# Patient Record
Sex: Female | Born: 1955 | Race: White | Hispanic: No | Marital: Married | Smoking: Former smoker
Health system: Southern US, Community
[De-identification: ages and names within clinical notes are randomized; demographics above are authoritative.]

## PROBLEM LIST (undated history)

## (undated) DIAGNOSIS — E079 Disorder of thyroid, unspecified: Secondary | ICD-10-CM

## (undated) DIAGNOSIS — E781 Pure hyperglyceridemia: Secondary | ICD-10-CM

## (undated) DIAGNOSIS — I10 Essential (primary) hypertension: Secondary | ICD-10-CM

## (undated) DIAGNOSIS — I2510 Atherosclerotic heart disease of native coronary artery without angina pectoris: Secondary | ICD-10-CM

## (undated) DIAGNOSIS — N200 Calculus of kidney: Secondary | ICD-10-CM

## (undated) DIAGNOSIS — G4730 Sleep apnea, unspecified: Secondary | ICD-10-CM

## (undated) DIAGNOSIS — I509 Heart failure, unspecified: Secondary | ICD-10-CM

## (undated) DIAGNOSIS — M549 Dorsalgia, unspecified: Secondary | ICD-10-CM

## (undated) DIAGNOSIS — M1990 Unspecified osteoarthritis, unspecified site: Secondary | ICD-10-CM

## (undated) DIAGNOSIS — Z720 Tobacco use: Secondary | ICD-10-CM

## (undated) DIAGNOSIS — J449 Chronic obstructive pulmonary disease, unspecified: Secondary | ICD-10-CM

## (undated) DIAGNOSIS — M810 Age-related osteoporosis without current pathological fracture: Secondary | ICD-10-CM

## (undated) DIAGNOSIS — Z95810 Presence of automatic (implantable) cardiac defibrillator: Secondary | ICD-10-CM

## (undated) DIAGNOSIS — I429 Cardiomyopathy, unspecified: Secondary | ICD-10-CM

## (undated) HISTORY — PX: CARPAL TUNNEL RELEASE: SHX101

## (undated) HISTORY — PX: PARTIAL HYSTERECTOMY: SHX80

## (undated) HISTORY — PX: TONSILLECTOMY: SUR1361

## (undated) HISTORY — PX: UPPER GASTROINTESTINAL ENDOSCOPY: SHX188

## (undated) HISTORY — DX: Sleep apnea, unspecified: G47.30

## (undated) HISTORY — DX: Cardiomyopathy, unspecified: I42.9

## (undated) HISTORY — PX: BLADDER SURGERY: SHX569

## (undated) HISTORY — DX: Atherosclerotic heart disease of native coronary artery without angina pectoris: I25.10

---

## 1998-02-01 ENCOUNTER — Other Ambulatory Visit: Admission: RE | Admit: 1998-02-01 | Discharge: 1998-02-01 | Payer: Self-pay | Admitting: Obstetrics & Gynecology

## 1998-04-27 ENCOUNTER — Other Ambulatory Visit: Admission: RE | Admit: 1998-04-27 | Discharge: 1998-04-27 | Payer: Self-pay | Admitting: Obstetrics & Gynecology

## 1998-06-14 ENCOUNTER — Other Ambulatory Visit: Admission: RE | Admit: 1998-06-14 | Discharge: 1998-06-14 | Payer: Self-pay | Admitting: Obstetrics & Gynecology

## 1998-10-17 ENCOUNTER — Other Ambulatory Visit: Admission: RE | Admit: 1998-10-17 | Discharge: 1998-10-17 | Payer: Self-pay | Admitting: Obstetrics & Gynecology

## 1999-01-23 ENCOUNTER — Other Ambulatory Visit: Admission: RE | Admit: 1999-01-23 | Discharge: 1999-01-23 | Payer: Self-pay | Admitting: Obstetrics & Gynecology

## 1999-02-02 ENCOUNTER — Other Ambulatory Visit: Admission: RE | Admit: 1999-02-02 | Discharge: 1999-02-02 | Payer: Self-pay | Admitting: Obstetrics & Gynecology

## 1999-03-13 ENCOUNTER — Ambulatory Visit (HOSPITAL_COMMUNITY): Admission: RE | Admit: 1999-03-13 | Discharge: 1999-03-13 | Payer: Self-pay | Admitting: *Deleted

## 1999-07-13 ENCOUNTER — Other Ambulatory Visit: Admission: RE | Admit: 1999-07-13 | Discharge: 1999-07-13 | Payer: Self-pay | Admitting: *Deleted

## 1999-07-14 ENCOUNTER — Other Ambulatory Visit: Admission: RE | Admit: 1999-07-14 | Discharge: 1999-07-14 | Payer: Self-pay | Admitting: *Deleted

## 1999-08-21 ENCOUNTER — Inpatient Hospital Stay (HOSPITAL_COMMUNITY): Admission: RE | Admit: 1999-08-21 | Discharge: 1999-08-28 | Payer: Self-pay | Admitting: *Deleted

## 1999-08-21 ENCOUNTER — Encounter (INDEPENDENT_AMBULATORY_CARE_PROVIDER_SITE_OTHER): Payer: Self-pay

## 1999-08-25 ENCOUNTER — Encounter: Payer: Self-pay | Admitting: General Surgery

## 2000-08-29 ENCOUNTER — Encounter: Admission: RE | Admit: 2000-08-29 | Discharge: 2000-08-29 | Payer: Self-pay | Admitting: *Deleted

## 2000-10-18 ENCOUNTER — Other Ambulatory Visit: Admission: RE | Admit: 2000-10-18 | Discharge: 2000-10-18 | Payer: Self-pay | Admitting: *Deleted

## 2001-10-16 ENCOUNTER — Encounter: Payer: Self-pay | Admitting: Family Medicine

## 2001-10-16 ENCOUNTER — Encounter: Admission: RE | Admit: 2001-10-16 | Discharge: 2001-10-16 | Payer: Self-pay | Admitting: Family Medicine

## 2001-10-21 ENCOUNTER — Other Ambulatory Visit: Admission: RE | Admit: 2001-10-21 | Discharge: 2001-10-21 | Payer: Self-pay | Admitting: Obstetrics and Gynecology

## 2002-11-24 ENCOUNTER — Encounter: Payer: Self-pay | Admitting: Family Medicine

## 2002-11-24 ENCOUNTER — Encounter: Admission: RE | Admit: 2002-11-24 | Discharge: 2002-11-24 | Payer: Self-pay | Admitting: Family Medicine

## 2003-02-08 ENCOUNTER — Encounter (HOSPITAL_COMMUNITY): Admission: RE | Admit: 2003-02-08 | Discharge: 2003-05-09 | Payer: Self-pay | Admitting: Endocrinology

## 2003-02-09 ENCOUNTER — Encounter: Payer: Self-pay | Admitting: Endocrinology

## 2003-12-02 ENCOUNTER — Encounter: Admission: RE | Admit: 2003-12-02 | Discharge: 2003-12-02 | Payer: Self-pay | Admitting: Family Medicine

## 2004-07-13 ENCOUNTER — Encounter (HOSPITAL_COMMUNITY): Admission: RE | Admit: 2004-07-13 | Discharge: 2004-10-11 | Payer: Self-pay | Admitting: Endocrinology

## 2004-12-05 ENCOUNTER — Encounter: Admission: RE | Admit: 2004-12-05 | Discharge: 2004-12-05 | Payer: Self-pay | Admitting: Family Medicine

## 2005-12-25 ENCOUNTER — Encounter: Admission: RE | Admit: 2005-12-25 | Discharge: 2005-12-25 | Payer: Self-pay | Admitting: Family Medicine

## 2006-08-20 ENCOUNTER — Encounter: Admission: RE | Admit: 2006-08-20 | Discharge: 2006-08-20 | Payer: Self-pay | Admitting: Family Medicine

## 2006-09-12 ENCOUNTER — Encounter (HOSPITAL_COMMUNITY)
Admission: RE | Admit: 2006-09-12 | Discharge: 2006-10-12 | Payer: Self-pay | Admitting: Physical Medicine and Rehabilitation

## 2006-12-30 ENCOUNTER — Encounter: Admission: RE | Admit: 2006-12-30 | Discharge: 2006-12-30 | Payer: Self-pay | Admitting: Family Medicine

## 2008-08-05 ENCOUNTER — Encounter: Admission: RE | Admit: 2008-08-05 | Discharge: 2008-08-05 | Payer: Self-pay | Admitting: Family Medicine

## 2009-01-04 ENCOUNTER — Encounter (INDEPENDENT_AMBULATORY_CARE_PROVIDER_SITE_OTHER): Payer: Self-pay | Admitting: *Deleted

## 2009-01-21 ENCOUNTER — Ambulatory Visit: Payer: Self-pay | Admitting: Internal Medicine

## 2009-02-14 ENCOUNTER — Encounter: Payer: Self-pay | Admitting: Internal Medicine

## 2009-02-14 ENCOUNTER — Ambulatory Visit: Payer: Self-pay | Admitting: Internal Medicine

## 2009-02-15 ENCOUNTER — Encounter: Payer: Self-pay | Admitting: Internal Medicine

## 2009-08-12 ENCOUNTER — Encounter: Admission: RE | Admit: 2009-08-12 | Discharge: 2009-08-12 | Payer: Self-pay | Admitting: Family Medicine

## 2009-12-25 ENCOUNTER — Emergency Department (HOSPITAL_COMMUNITY): Admission: EM | Admit: 2009-12-25 | Discharge: 2009-12-25 | Payer: Self-pay | Admitting: Emergency Medicine

## 2010-03-27 ENCOUNTER — Other Ambulatory Visit: Admission: RE | Admit: 2010-03-27 | Discharge: 2010-03-27 | Payer: Self-pay | Admitting: Endocrinology

## 2010-08-25 ENCOUNTER — Encounter: Admission: RE | Admit: 2010-08-25 | Discharge: 2010-08-25 | Payer: Self-pay | Admitting: Endocrinology

## 2010-11-25 ENCOUNTER — Encounter: Payer: Self-pay | Admitting: Endocrinology

## 2010-11-26 ENCOUNTER — Encounter: Payer: Self-pay | Admitting: Endocrinology

## 2011-01-24 LAB — URINALYSIS, ROUTINE W REFLEX MICROSCOPIC
Bilirubin Urine: NEGATIVE
Glucose, UA: NEGATIVE mg/dL
Leukocytes, UA: NEGATIVE
Nitrite: NEGATIVE
Protein, ur: NEGATIVE mg/dL
Specific Gravity, Urine: 1.015 (ref 1.005–1.030)
Urobilinogen, UA: 0.2 mg/dL (ref 0.0–1.0)
pH: 7.5 (ref 5.0–8.0)

## 2011-01-24 LAB — URINE MICROSCOPIC-ADD ON

## 2011-01-24 LAB — COMPREHENSIVE METABOLIC PANEL
ALT: 82 U/L — ABNORMAL HIGH (ref 0–35)
AST: 66 U/L — ABNORMAL HIGH (ref 0–37)
Albumin: 3.6 g/dL (ref 3.5–5.2)
Alkaline Phosphatase: 87 U/L (ref 39–117)
BUN: 15 mg/dL (ref 6–23)
CO2: 26 mEq/L (ref 19–32)
Calcium: 9.7 mg/dL (ref 8.4–10.5)
Chloride: 107 mEq/L (ref 96–112)
Creatinine, Ser: 1.07 mg/dL (ref 0.4–1.2)
GFR calc Af Amer: >60 mL/min (ref >60)
GFR calc non Af Amer: 54 mL/min — ABNORMAL LOW (ref >60)
Glucose, Bld: 134 mg/dL — ABNORMAL HIGH (ref 70–99)
Potassium: 3.8 mEq/L (ref 3.5–5.1)
Sodium: 139 mEq/L (ref 135–145)
Total Bilirubin: 0.7 mg/dL (ref 0.3–1.2)
Total Protein: 7.1 g/dL (ref 6.0–8.3)

## 2011-01-24 LAB — CBC
HCT: 41.2 % (ref 36.0–46.0)
Hemoglobin: 13.8 g/dL (ref 12.0–15.0)
MCHC: 33.6 g/dL (ref 30.0–36.0)
MCV: 94.4 fL (ref 78.0–100.0)
Platelets: 185 10*3/uL (ref 150–400)
RBC: 4.36 MIL/uL (ref 3.87–5.11)
RDW: 13.9 % (ref 11.5–15.5)
WBC: 9.9 10*3/uL (ref 4.0–10.5)

## 2011-01-24 LAB — URINE CULTURE
Colony Count: NO GROWTH
Culture: NO GROWTH

## 2011-01-24 LAB — DIFFERENTIAL
Basophils Absolute: 0 10*3/uL (ref 0.0–0.1)
Basophils Relative: 0 % (ref 0–1)
Eosinophils Absolute: 0.1 10*3/uL (ref 0.0–0.7)
Eosinophils Relative: 1 % (ref 0–5)
Lymphocytes Relative: 12 % (ref 12–46)
Lymphs Abs: 1.2 10*3/uL (ref 0.7–4.0)
Monocytes Absolute: 0.8 10*3/uL (ref 0.1–1.0)
Monocytes Relative: 9 % (ref 3–12)
Neutro Abs: 7.7 10*3/uL (ref 1.7–7.7)
Neutrophils Relative %: 78 % — ABNORMAL HIGH (ref 43–77)

## 2011-07-24 ENCOUNTER — Other Ambulatory Visit: Payer: Self-pay | Admitting: Endocrinology

## 2011-07-24 DIAGNOSIS — Z1231 Encounter for screening mammogram for malignant neoplasm of breast: Secondary | ICD-10-CM

## 2011-08-06 ENCOUNTER — Ambulatory Visit (HOSPITAL_BASED_OUTPATIENT_CLINIC_OR_DEPARTMENT_OTHER)
Admission: RE | Admit: 2011-08-06 | Discharge: 2011-08-07 | Disposition: A | Discharge status: Home / Self Care | Payer: PRIVATE HEALTH INSURANCE | Source: Ambulatory Visit | Attending: Urology | Admitting: Urology

## 2011-08-06 DIAGNOSIS — F17200 Nicotine dependence, unspecified, uncomplicated: Secondary | ICD-10-CM | POA: Insufficient documentation

## 2011-08-06 DIAGNOSIS — N815 Vaginal enterocele: Secondary | ICD-10-CM | POA: Insufficient documentation

## 2011-08-06 DIAGNOSIS — I10 Essential (primary) hypertension: Secondary | ICD-10-CM | POA: Insufficient documentation

## 2011-08-06 DIAGNOSIS — N8189 Other female genital prolapse: Secondary | ICD-10-CM | POA: Insufficient documentation

## 2011-08-06 DIAGNOSIS — Z01810 Encounter for preprocedural cardiovascular examination: Secondary | ICD-10-CM | POA: Insufficient documentation

## 2011-08-06 DIAGNOSIS — N8111 Cystocele, midline: Secondary | ICD-10-CM | POA: Insufficient documentation

## 2011-08-06 DIAGNOSIS — Z01812 Encounter for preprocedural laboratory examination: Secondary | ICD-10-CM | POA: Insufficient documentation

## 2011-08-06 DIAGNOSIS — N393 Stress incontinence (female) (male): Secondary | ICD-10-CM | POA: Insufficient documentation

## 2011-08-06 LAB — POCT I-STAT 4, (NA,K, GLUC, HGB,HCT)
Glucose, Bld: 96 mg/dL (ref 70–99)
HCT: 43 % (ref 36.0–46.0)
Hemoglobin: 14.6 g/dL (ref 12.0–15.0)
Potassium: 3.8 mEq/L (ref 3.5–5.1)
Sodium: 142 mEq/L (ref 135–145)

## 2011-08-10 NOTE — Op Note (Signed)
Stacy Hernandez, Stacy Hernandez               ACCOUNT NO.:  [id number]  MEDICAL RECORD NO.:  [id number]  LOCATION:                               FACILITY:  [facility]  PHYSICIAN:  Emersen Carroll I. 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  OPERATIONS:  Anterior vaginal vault exploration, Kelly plication, enterocele repair with tissue implantation (Xenform), [employer] scientific uphold anterior vault suspension, lynx pubovaginal sling.  SURGEON:  Jaidon Ellery I. Patsi Sears, M.D.  ANESTHESIA:  General LMA.  PREPARATION:  After appropriate preanesthesia, the patient was brought to the operating room, placed on the operating table in the dorsal supine position.  She was replaced in dorsal lithotomy position where the pubis was prepped with Betadine solution and draped in usual fashion.  REVIEW OF HISTORY:  This 55 year old female, has a history of "bulge" at the introitus, and feels that she is sitting on "something hard."  She has urinary stress incontinence, is not sexually active.  She is status post hysterectomy 8 years ago, gravida 3, para 3, smoking 2 packs a day for 33 years (nicotine patch in OR).  Note, that the patient was noted to have a poorly estrogenized vagina, treated with estrogen, with a grade 3 anterior vault defect with no rectocele present.  She had video urodynamics, which showed hypermobile urethra, but she did not leak for stress incontinence evaluation.  She did have a small capacity, unstable bladder, with a large cystocele.  No enterocele was identified and no rectocele was identified.  PROCEDURE:  With the patient in the lithotomy position, vaginal examination reveals a  grade 3 cystocele, with obvious level I support gone.  Horizontal marking was made 1 cm beneath the Foley catheter balloon, marked with a blue dot, to indicate vaginal incision.  Midline urethra, outline of incision was made with blue marking pen.  Marcaine 0.25 with epinephrine 1:200,000, diluted with normal saline was injected in the vaginal incision site.  Horizontal incision was then made across the vagina, and subcutaneous tissue dissected with sharp and blunt dissection.  It was noted that the apex of the vagina was definitely off-center, way over on the left edge of the vagina.  Careful dissection revealed enterocele present at the level of the vaginal apex. I elected to dissect out the enterocele sac, closed the enterocele sac. I then elected to place a 2 cm x 2 cm portion of Xenform as a patch over the enterocele repair, sutured in place with 2-0 Vicryl suture in a smooth patch formation.  Prior to placing the patch, dissection was accomplished into the pelvic space, were identified bilaterally the ischial spines, and the sacrospinous ligaments.  Using the Capio device, the sacrospinous ligament sutures were placed bilaterally.  The patient underwent a small Kelly plication, and then underwent Xenform patch repair after the enterocele sac was closed.  It was noted it is unusual for an enterocele to present in the anterior vault, but it is felt associated  with the extreme left deviation of the cervical scar from hysterectomy.  Following this, the uphold was positioned and both the proximal and distal portions were sutured in position with 2-0 Vicryl suture.  The mesh was smooth and flat.  Irrigation was accomplished and no bleeding was noted.  The mesh was felt to be loosely tensioned.  The devascularized edges of the vagina were freshened, and the wound was closed with running 2-0 Vicryl suture from 12 o'clock to 9 o'clock, and from 12 o'clock to 3 o'clock.  Attention was  then given to the lynx sling, and using the blue pen, areas for incision were outlined 2 fingerbreadths above the pubic tubercle, and 2 fingerbreadth lateralward.  The bladder was drained of fluid.  The midurethra was again marked.  Injection was then accomplished with the aforementioned Marcaine solution.  The 1.5 cm incision was made in the midurethra, subcutaneous tissue was dissected bilaterally to the level of the pubis.  Two incisions were then made suprapubically, and the lynx needles were then placed in front of the bladder, behind the pubis, and into the wound.  Cystoscopy, after indigo carmine was given, was accomplished, and showed blue contrast in both orifices, with no evidence of needles within the bladder.  The [place] sling was placed without difficulty, and was tensioned loosely.  There was a clamp behind the sling at all times.  The mesh was smoothed against the urethral.  The wound was closed with running 2-0 Vicryl suture.  It was noted that cystoscopy was accomplished with both 12 degree and 70 degree lenses.  Foley catheter was replaced, and vaginal packing was placed.  The suprapubic wounds were closed with Dermabond. The patient was then awakened, taken to recovery room in good condition. She received IV Tylenol at the beginning of the procedure, as well as antibiotic.     Jahnya Trindade I. Patsi Sears, M.D.     SIT/MEDQ  D:  08/06/2011  T:  08/07/2011  Job:  [id number]  cc:   Excell Seltzer. Annabell Howells, M.D. Fax: [phone]  Dorisann Frames, M.D. Fax: [phone]  Electronically Signed by Jethro Bolus M.D. on 08/10/2011 12:54:15 PM

## 2011-08-14 ENCOUNTER — Emergency Department (HOSPITAL_COMMUNITY)
Admission: EM | Admit: 2011-08-14 | Discharge: 2011-08-14 | Disposition: A | Discharge status: Home / Self Care | Payer: PRIVATE HEALTH INSURANCE | Attending: Emergency Medicine | Admitting: Emergency Medicine

## 2011-08-14 DIAGNOSIS — R109 Unspecified abdominal pain: Secondary | ICD-10-CM | POA: Insufficient documentation

## 2011-08-14 DIAGNOSIS — R339 Retention of urine, unspecified: Secondary | ICD-10-CM | POA: Insufficient documentation

## 2011-08-14 DIAGNOSIS — I10 Essential (primary) hypertension: Secondary | ICD-10-CM | POA: Insufficient documentation

## 2011-08-27 ENCOUNTER — Ambulatory Visit
Admission: RE | Admit: 2011-08-27 | Discharge: 2011-08-27 | Disposition: A | Discharge status: Home / Self Care | Payer: PRIVATE HEALTH INSURANCE | Source: Ambulatory Visit | Attending: Endocrinology | Admitting: Endocrinology

## 2011-08-27 DIAGNOSIS — Z1231 Encounter for screening mammogram for malignant neoplasm of breast: Secondary | ICD-10-CM

## 2011-09-04 ENCOUNTER — Other Ambulatory Visit: Payer: Self-pay | Admitting: Gastroenterology

## 2011-11-07 ENCOUNTER — Emergency Department (HOSPITAL_COMMUNITY)
Admission: EM | Admit: 2011-11-07 | Discharge: 2011-11-07 | Disposition: A | Discharge status: Home / Self Care | Payer: Worker's Compensation | Attending: Emergency Medicine | Admitting: Emergency Medicine

## 2011-11-07 ENCOUNTER — Encounter: Payer: Self-pay | Admitting: *Deleted

## 2011-11-07 ENCOUNTER — Emergency Department (HOSPITAL_COMMUNITY): Payer: Worker's Compensation

## 2011-11-07 DIAGNOSIS — E789 Disorder of lipoprotein metabolism, unspecified: Secondary | ICD-10-CM | POA: Insufficient documentation

## 2011-11-07 DIAGNOSIS — S61209A Unspecified open wound of unspecified finger without damage to nail, initial encounter: Secondary | ICD-10-CM | POA: Insufficient documentation

## 2011-11-07 DIAGNOSIS — I10 Essential (primary) hypertension: Secondary | ICD-10-CM | POA: Insufficient documentation

## 2011-11-07 DIAGNOSIS — F17200 Nicotine dependence, unspecified, uncomplicated: Secondary | ICD-10-CM | POA: Insufficient documentation

## 2011-11-07 DIAGNOSIS — W268XXA Contact with other sharp object(s), not elsewhere classified, initial encounter: Secondary | ICD-10-CM | POA: Insufficient documentation

## 2011-11-07 DIAGNOSIS — R209 Unspecified disturbances of skin sensation: Secondary | ICD-10-CM | POA: Insufficient documentation

## 2011-11-07 DIAGNOSIS — Z79899 Other long term (current) drug therapy: Secondary | ICD-10-CM | POA: Insufficient documentation

## 2011-11-07 DIAGNOSIS — Z1810 Retained metal fragments, unspecified: Secondary | ICD-10-CM | POA: Insufficient documentation

## 2011-11-07 DIAGNOSIS — E079 Disorder of thyroid, unspecified: Secondary | ICD-10-CM | POA: Insufficient documentation

## 2011-11-07 DIAGNOSIS — S61239A Puncture wound without foreign body of unspecified finger without damage to nail, initial encounter: Secondary | ICD-10-CM

## 2011-11-07 HISTORY — DX: Disorder of thyroid, unspecified: E07.9

## 2011-11-07 HISTORY — DX: Essential (primary) hypertension: I10

## 2011-11-07 MED ORDER — TETANUS-DIPHTH-ACELL PERTUSSIS 5-2.5-18.5 LF-MCG/0.5 IM SUSP
0.5000 mL | Freq: Once | INTRAMUSCULAR | Status: AC
  Administered 2011-11-07: 0.5 mL via INTRAMUSCULAR
  Filled 2011-11-07: qty 0.5

## 2011-11-07 NOTE — ED Provider Notes (Signed)
History     CSN: [id number]  Arrival date & time 11/07/11  1657   First MD Initiated Contact with Patient 11/07/11 1824      Chief Complaint  Patient presents with  . Hand Injury    (Consider location/radiation/quality/duration/timing/severity/associated sxs/prior treatment) HPI history from patient Patient is a 56 year old female presents with left index finger injury. 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 This happened just prior to arrival. The pain has been constant and is worse when she moves her finger. She notes tingling distal to needle. She is otherwise able to move her finger and has normal sensation in her finger. She is unknown when her last tetanus shot was. No other injuries noted. Overall severity moderate.  Past Medical History  Diagnosis Date  . Hypertension   . Thyroid disease   . High cholesterol     Past Surgical History  Procedure Date  . Bladder surgery     History reviewed. No pertinent family history.  History  Substance Use Topics  . Smoking status: Current Everyday Smoker  . Smokeless tobacco: Not on file  . Alcohol Use: No    OB History    Grav Para Term Preterm Abortions TAB SAB Ect Mult Living                  Review of Systems  Constitutional: Negative for fever.  HENT: Negative for facial swelling.   Eyes: Negative for visual disturbance.  Respiratory: Negative for cough and shortness of breath.   Genitourinary: Negative for difficulty urinating.  All other systems reviewed and are negative.    Allergies  Review of patient's allergies indicates no known allergies.  Home Medications   Current Outpatient Rx  Name Route Sig Dispense Refill  . AMLODIPINE BESYLATE 10 MG PO TABS Oral Take 10 mg by mouth daily.      Marland Kitchen VITAMIN D-3 PO Oral Take 1 capsule by mouth daily.      Marland Kitchen DIAZEPAM 5 MG PO TABS Oral Take 5 mg by mouth 2  (two) times daily. For anxiety    . LEVOTHYROXINE SODIUM 112 MCG PO TABS Oral Take 112 mcg by mouth daily.      Marland Kitchen NIACIN ER (ANTIHYPERLIPIDEMIC) 750 MG PO TBCR Oral Take 750 mg by mouth at bedtime.      Marland Kitchen OLMESARTAN MEDOXOMIL 20 MG PO TABS Oral Take 20 mg by mouth daily.        BP 151/64  Pulse 80  Temp 98.3 F (36.8 C)  Resp 19  SpO2 97%  Physical Exam  Nursing note and vitals reviewed. Constitutional: She is oriented to person, place, and time. She appears well-developed and well-nourished. No distress.  HENT:  Head: Normocephalic and atraumatic.  Nose: Nose normal.  Eyes: EOM are normal.  Neck: Normal range of motion. No JVD present.  Cardiovascular: Normal rate and intact distal pulses.   Pulmonary/Chest: Effort normal. No respiratory distress.  Abdominal: Soft.       No visible injury  Musculoskeletal: Normal range of motion.       Left index finger: Knitting hook stuck in pad of the index finger. No active bleeding. Patient describes paresthesias distal to injury site. She has full range of motion in all joints. Normal capillary refill. Finger is well perfused.  Neurological: She is alert and oriented to person, place, and time. No cranial nerve deficit.  Coordination normal.  Skin: Skin is warm and dry. She is not diaphoretic.  Psychiatric: She has a normal mood and affect. Her behavior is normal. Thought content normal.    ED Course  Procedures (including critical care time)  Labs Reviewed - No data to display Dg Finger Index Left  11/07/2011  *RADIOLOGY REPORT*  Clinical Data: Left index finger pain following a stab injury with a metal pin.  LEFT INDEX FINGER 2+V  Comparison: None.  Findings: Linear metallic foreign body in the ventral aspect of the index finger at the level of the PIP joint.  This is imbedded in the soft tissues without extension to the bone.  No fracture seen.  IMPRESSION: Linear metallic foreign body in the ventral soft tissues of the index finger with  no underlying bony abnormality.  Original Report Authenticated By: Darrol Angel, M.D.     1. Puncture wound of finger of left hand       MDM   Patient here with work-related injury. Her tetanus is up-to-date. X-ray is negative for osseous involvement or fracture. Digital block performed and foreign body removed without complication. Wound then irrigated. Patient had normal vascular and sensory function of finger prior to digital block. After removal of foreign body, patient has normal flexor tendon function. We discussed followup plan and return precautions.        Milus Glazier 11/08/11 0032

## 2011-11-07 NOTE — ED Notes (Signed)
Pt with "knitter hook" to left pointer finger. Pt reports happened at 3. Pt reports mild tingling to tip of finger.

## 2011-11-10 NOTE — ED Provider Notes (Signed)
I saw and evaluated the patient, reviewed the resident's note and I agree with the findings and plan.   .Face to face Exam:  General:  Awake HEENT:  Atraumatic Resp:  Normal effort Abd:  Nondistended Neuro:No focal weakness Lymph: No adenopathy   Nelia Shi, MD 11/10/11 1316

## 2012-04-15 ENCOUNTER — Other Ambulatory Visit: Payer: Self-pay | Admitting: Otolaryngology

## 2012-04-22 ENCOUNTER — Observation Stay (HOSPITAL_COMMUNITY)
Admission: EM | Admit: 2012-04-22 | Discharge: 2012-04-23 | DRG: 125 | Disposition: A | Discharge status: Home / Self Care | Payer: BC Managed Care – PPO | Attending: Cardiology | Admitting: Cardiology

## 2012-04-22 ENCOUNTER — Encounter (HOSPITAL_COMMUNITY): Payer: Self-pay

## 2012-04-22 ENCOUNTER — Encounter (HOSPITAL_COMMUNITY)
Admission: EM | Disposition: A | Discharge status: Home / Self Care | Payer: Self-pay | Source: Home / Self Care | Attending: Emergency Medicine

## 2012-04-22 ENCOUNTER — Emergency Department (HOSPITAL_COMMUNITY): Payer: BC Managed Care – PPO

## 2012-04-22 DIAGNOSIS — R9431 Abnormal electrocardiogram [ECG] [EKG]: Secondary | ICD-10-CM | POA: Insufficient documentation

## 2012-04-22 DIAGNOSIS — F17200 Nicotine dependence, unspecified, uncomplicated: Secondary | ICD-10-CM | POA: Insufficient documentation

## 2012-04-22 DIAGNOSIS — I10 Essential (primary) hypertension: Secondary | ICD-10-CM | POA: Insufficient documentation

## 2012-04-22 DIAGNOSIS — I2510 Atherosclerotic heart disease of native coronary artery without angina pectoris: Secondary | ICD-10-CM

## 2012-04-22 DIAGNOSIS — R0789 Other chest pain: Principal | ICD-10-CM | POA: Insufficient documentation

## 2012-04-22 DIAGNOSIS — I447 Left bundle-branch block, unspecified: Secondary | ICD-10-CM | POA: Insufficient documentation

## 2012-04-22 DIAGNOSIS — R079 Chest pain, unspecified: Secondary | ICD-10-CM | POA: Insufficient documentation

## 2012-04-22 DIAGNOSIS — R0602 Shortness of breath: Secondary | ICD-10-CM | POA: Insufficient documentation

## 2012-04-22 DIAGNOSIS — I200 Unstable angina: Secondary | ICD-10-CM

## 2012-04-22 HISTORY — DX: Calculus of kidney: N20.0

## 2012-04-22 HISTORY — DX: Tobacco use: Z72.0

## 2012-04-22 HISTORY — DX: Hypercalcemia: E83.52

## 2012-04-22 HISTORY — DX: Pure hyperglyceridemia: E78.1

## 2012-04-22 HISTORY — DX: Dorsalgia, unspecified: M54.9

## 2012-04-22 HISTORY — DX: Age-related osteoporosis without current pathological fracture: M81.0

## 2012-04-22 HISTORY — PX: LEFT HEART CATHETERIZATION WITH CORONARY ANGIOGRAM: SHX5451

## 2012-04-22 LAB — CBC
HCT: 38.9 % (ref 36.0–46.0)
Hemoglobin: 13.3 g/dL (ref 12.0–15.0)
MCH: 31.4 pg (ref 26.0–34.0)
MCHC: 34.2 g/dL (ref 30.0–36.0)
MCV: 92 fL (ref 78.0–100.0)
Platelets: 182 10*3/uL (ref 150–400)
RBC: 4.23 MIL/uL (ref 3.87–5.11)
RDW: 13.2 % (ref 11.5–15.5)
WBC: 8.2 10*3/uL (ref 4.0–10.5)

## 2012-04-22 LAB — URINALYSIS, ROUTINE W REFLEX MICROSCOPIC
Bilirubin Urine: NEGATIVE
Glucose, UA: NEGATIVE mg/dL
Hgb urine dipstick: NEGATIVE
Ketones, ur: NEGATIVE mg/dL
Leukocytes, UA: NEGATIVE
Nitrite: NEGATIVE
Protein, ur: NEGATIVE mg/dL
Specific Gravity, Urine: 1.013 (ref 1.005–1.030)
Urobilinogen, UA: 0.2 mg/dL (ref 0.0–1.0)
pH: 7 (ref 5.0–8.0)

## 2012-04-22 LAB — COMPREHENSIVE METABOLIC PANEL
ALT: 14 U/L (ref 0–35)
AST: 18 U/L (ref 0–37)
Albumin: 3.7 g/dL (ref 3.5–5.2)
Alkaline Phosphatase: 79 U/L (ref 39–117)
BUN: 12 mg/dL (ref 6–23)
CO2: 24 mEq/L (ref 19–32)
Calcium: 9.7 mg/dL (ref 8.4–10.5)
Chloride: 107 mEq/L (ref 96–112)
Creatinine, Ser: 0.57 mg/dL (ref 0.50–1.10)
GFR calc Af Amer: >90 mL/min (ref >90)
GFR calc non Af Amer: >90 mL/min (ref >90)
Glucose, Bld: 88 mg/dL (ref 70–99)
Potassium: 4.1 mEq/L (ref 3.5–5.1)
Sodium: 141 mEq/L (ref 135–145)
Total Bilirubin: 0.3 mg/dL (ref 0.3–1.2)
Total Protein: 7 g/dL (ref 6.0–8.3)

## 2012-04-22 LAB — CARDIAC PANEL(CRET KIN+CKTOT+MB+TROPI)
CK, MB: 1.9 ng/mL (ref 0.3–4.0)
CK, MB: 1.9 ng/mL (ref 0.3–4.0)
CK, MB: 1.7 ng/mL (ref 0.3–4.0)
Relative Index: INVALID (ref 0.0–2.5)
Relative Index: INVALID (ref 0.0–2.5)
Relative Index: INVALID (ref 0.0–2.5)
Total CK: 57 U/L (ref 7–177)
Total CK: 49 U/L (ref 7–177)
Total CK: 44 U/L (ref 7–177)
Troponin I: <0.3 ng/mL (ref <0.30)
Troponin I: <0.3 ng/mL (ref <0.30)
Troponin I: <0.3 ng/mL (ref <0.30)

## 2012-04-22 LAB — DIFFERENTIAL
Basophils Absolute: 0 10*3/uL (ref 0.0–0.1)
Basophils Relative: 1 % (ref 0–1)
Eosinophils Absolute: 0.2 10*3/uL (ref 0.0–0.7)
Eosinophils Relative: 2 % (ref 0–5)
Lymphocytes Relative: 29 % (ref 12–46)
Lymphs Abs: 2.4 10*3/uL (ref 0.7–4.0)
Monocytes Absolute: 0.7 10*3/uL (ref 0.1–1.0)
Monocytes Relative: 9 % (ref 3–12)
Neutro Abs: 4.9 10*3/uL (ref 1.7–7.7)
Neutrophils Relative %: 59 % (ref 43–77)

## 2012-04-22 LAB — MRSA PCR SCREENING: MRSA by PCR: NEGATIVE

## 2012-04-22 LAB — PROTIME-INR
INR: 1.05 (ref 0.00–1.49)
Prothrombin Time: 13.9 seconds (ref 11.6–15.2)

## 2012-04-22 LAB — TSH: TSH: 0.563 u[IU]/mL (ref 0.350–4.500)

## 2012-04-22 LAB — D-DIMER, QUANTITATIVE: D-Dimer, Quant: 0.34 ug/mL-FEU (ref 0.00–0.48)

## 2012-04-22 SURGERY — LEFT HEART CATHETERIZATION WITH CORONARY ANGIOGRAM
Anesthesia: LOCAL

## 2012-04-22 MED ORDER — FENTANYL CITRATE 0.05 MG/ML IJ SOLN
INTRAMUSCULAR | Status: AC
  Filled 2012-04-22: qty 2

## 2012-04-22 MED ORDER — ACETAMINOPHEN 325 MG PO TABS: 650.0000 mg | ORAL_TABLET | ORAL | Status: DC | PRN

## 2012-04-22 MED ORDER — MIDAZOLAM HCL 2 MG/2ML IJ SOLN
INTRAMUSCULAR | Status: AC
  Filled 2012-04-22: qty 2

## 2012-04-22 MED ORDER — NITROGLYCERIN 0.4 MG SL SUBL: 0.4000 mg | SUBLINGUAL_TABLET | SUBLINGUAL | Status: DC | PRN

## 2012-04-22 MED ORDER — ALPRAZOLAM 0.25 MG PO TABS
0.2500 mg | ORAL_TABLET | Freq: Two times a day (BID) | ORAL | Status: DC
  Administered 2012-04-22 – 2012-04-23 (×2): 0.25 mg via ORAL
  Filled 2012-04-22 (×2): qty 1

## 2012-04-22 MED ORDER — ONDANSETRON HCL 4 MG/2ML IJ SOLN: 4.0000 mg | Freq: Four times a day (QID) | INTRAMUSCULAR | Status: DC | PRN

## 2012-04-22 MED ORDER — SODIUM CHLORIDE 0.9 % IV SOLN: INTRAVENOUS | Status: AC

## 2012-04-22 MED ORDER — AMLODIPINE BESYLATE 10 MG PO TABS
10.0000 mg | ORAL_TABLET | Freq: Every day | ORAL | Status: DC
  Administered 2012-04-22 – 2012-04-23 (×2): 10 mg via ORAL
  Filled 2012-04-22 (×2): qty 1

## 2012-04-22 MED ORDER — NICOTINE 14 MG/24HR TD PT24
14.0000 mg | MEDICATED_PATCH | Freq: Every day | TRANSDERMAL | Status: DC
  Administered 2012-04-22 – 2012-04-23 (×2): 14 mg via TRANSDERMAL
  Filled 2012-04-22 (×2): qty 1

## 2012-04-22 MED ORDER — ASPIRIN 81 MG PO CHEW
324.0000 mg | CHEWABLE_TABLET | ORAL | Status: DC
  Filled 2012-04-22: qty 4

## 2012-04-22 MED ORDER — LOSARTAN POTASSIUM 50 MG PO TABS
50.0000 mg | ORAL_TABLET | Freq: Every day | ORAL | Status: DC
  Administered 2012-04-22 – 2012-04-23 (×2): 50 mg via ORAL
  Filled 2012-04-22 (×2): qty 1

## 2012-04-22 MED ORDER — NIACIN ER 500 MG PO CPCR
1500.0000 mg | ORAL_CAPSULE | Freq: Every day | ORAL | Status: DC
  Administered 2012-04-22: 1500 mg via ORAL
  Filled 2012-04-22 (×2): qty 3

## 2012-04-22 MED ORDER — ADULT MULTIVITAMIN W/MINERALS CH
1.0000 | ORAL_TABLET | Freq: Every day | ORAL | Status: DC
  Administered 2012-04-22 – 2012-04-23 (×2): 1 via ORAL
  Filled 2012-04-22 (×2): qty 1

## 2012-04-22 MED ORDER — ATORVASTATIN CALCIUM 80 MG PO TABS
80.0000 mg | ORAL_TABLET | Freq: Every day | ORAL | Status: DC
  Filled 2012-04-22: qty 1

## 2012-04-22 MED ORDER — HEPARIN (PORCINE) IN NACL 100-0.45 UNIT/ML-% IJ SOLN
950.0000 [IU]/h | INTRAMUSCULAR | Status: DC
  Filled 2012-04-22: qty 250

## 2012-04-22 MED ORDER — VITAMIN D3 25 MCG (1000 UNIT) PO TABS
2000.0000 [IU] | ORAL_TABLET | Freq: Every day | ORAL | Status: DC
  Administered 2012-04-22 – 2012-04-23 (×2): 2000 [IU] via ORAL
  Filled 2012-04-22 (×2): qty 2

## 2012-04-22 MED ORDER — NITROGLYCERIN 0.2 MG/ML ON CALL CATH LAB
INTRAVENOUS | Status: AC
  Filled 2012-04-22: qty 1

## 2012-04-22 MED ORDER — HEPARIN BOLUS VIA INFUSION: 3500.0000 [IU] | Freq: Once | INTRAVENOUS | Status: DC

## 2012-04-22 MED ORDER — ASPIRIN 81 MG PO CHEW
324.0000 mg | CHEWABLE_TABLET | ORAL | Status: AC
  Administered 2012-04-22: 324 mg via ORAL

## 2012-04-22 MED ORDER — ASPIRIN EC 81 MG PO TBEC
81.0000 mg | DELAYED_RELEASE_TABLET | Freq: Every day | ORAL | Status: DC
  Administered 2012-04-23: 81 mg via ORAL
  Filled 2012-04-22: qty 1

## 2012-04-22 MED ORDER — LEVOTHYROXINE SODIUM 112 MCG PO TABS
112.0000 ug | ORAL_TABLET | Freq: Every day | ORAL | Status: DC
  Administered 2012-04-23: 112 ug via ORAL
  Filled 2012-04-22 (×2): qty 1

## 2012-04-22 MED ORDER — LIDOCAINE HCL (PF) 1 % IJ SOLN
INTRAMUSCULAR | Status: AC
  Filled 2012-04-22: qty 30

## 2012-04-22 MED ORDER — SODIUM CHLORIDE 0.9 % IV SOLN: INTRAVENOUS | Status: DC

## 2012-04-22 MED ORDER — HEPARIN (PORCINE) IN NACL 2-0.9 UNIT/ML-% IJ SOLN
INTRAMUSCULAR | Status: AC
  Filled 2012-04-22: qty 1000

## 2012-04-22 MED ORDER — ASPIRIN 300 MG RE SUPP: 300.0000 mg | RECTAL | Status: AC

## 2012-04-22 NOTE — ED Notes (Signed)
Dr Shirlee Latch has been in to see pt. Will try to take pt to cath lab today

## 2012-04-22 NOTE — ED Notes (Signed)
Spoke with margaret in cath lab. Advises may be an hour or so so go ahead and send pt to her room upstairs

## 2012-04-22 NOTE — ED Notes (Signed)
Pt here from [facility] for ekg changes. Pt went for annual physical and had changes on ekg and sts ongoing chest pressure for 3 months no change today, ekg showed t wave inversion. But sinus rhythm. Pt denies complaints at this time.

## 2012-04-22 NOTE — CV Procedure (Signed)
  Cardiac Catheterization Procedure Note  Name: Stacy Hernandez MRN: [id number] DOB: December 18, 1955  Procedure: Left Heart Cath, Selective Coronary Angiography, LV angiography,    Indication: Chest pain with abnormal ECG.  Diagnostic Procedure Details: The right groin was prepped, draped, and anesthetized with 1% lidocaine. Using the modified Seldinger technique, a 4 French sheath was introduced into the right femoral artery. Standard Judkins catheters were used for selective coronary angiography and left ventriculography. Catheter exchanges were performed over a wire.  The diagnostic procedure was well-tolerated without immediate complications.  I reviewed the studies with the patient, and subsequently her husband and daughter.  Of note, during the procedure, the patient went in and out of LBBB intermittently, the would resume a narrow complex rhythm   (?source of T inversion)  PROCEDURAL FINDINGS Hemodynamics: AO 155/84 (111) LV 152/16 No gradient  Coronary angiography: Coronary dominance: right  Left mainstem: Very short and without significant obstruction.   No calcium  Left anterior descending (LAD): Courses to the apex and is a large caliber vessel.  There is mild plaquing at the LAD diagonal bifurcation that is not obstructive.  There is also minor plaquing in some views at the origin of the major septal.  There is also about 40% smooth narrowing in the major diagonal just after the bifurcation in the vessel.   No clinical obstruction is observed.    Left circumflex (LCx): Provides a tiny ramus, moderately large marginal, and smaller av circ.  No major obstruction is observed.    Right coronary artery (RCA): Very large caliber vessel that provides a major PDA and very large PLA.  There is 30% narrowing near the proximal and mid junction.  No critical obstruction is observed.    Left ventriculography: Left ventricular systolic function is normal, LVEF is estimated at 55-65%, there is no  significant mitral regurgitation (minor catheter induced)   Final Conclusions:   1.  Scattered coronary irregularities without high grade obstruction--as noted above 2.  Preserved LV function 3.  Intermittent BBB observed in the cath lab during the case  (? Explaining T inversion)  Recommendations:  1.  Check D-dimer 2,  Patient must stop smoking.   3.  Consider 2D echocardiography.   [name] 04/22/2012, 6:02 PM

## 2012-04-22 NOTE — Progress Notes (Signed)
ANTICOAGULATION CONSULT NOTE - Initial Consult  Pharmacy Consult for heparin Indication: chest pain/ACS  No Known Allergies  Patient Measurements: Height: 5\' 2"  (157.5 cm) Weight: 150 lb (68.04 kg) IBW/kg (Calculated) : 50.1  Heparin Dosing Weight: 68kg  Vital Signs: Temp: 98.5 F (36.9 C) (06/18 1045) Temp src: Oral (06/18 1045) BP: 139/59 mmHg (06/18 1500) Pulse Rate: 58  (06/18 1500)  Labs:  Basename 04/22/12 1117  HGB 13.3  HCT 38.9  PLT 182  APTT --  LABPROT 13.9  INR 1.05  HEPARINUNFRC --  CREATININE 0.57  CKTOTAL 57  CKMB 1.9  TROPONINI <0.30    Estimated Creatinine Clearance: 71.9 ml/min (by C-G formula based on Cr of 0.57).   Medical History: Past Medical History  Diagnosis Date  . Hypertension   . Thyroid disease     Post-ablation hypothyroidism  . Hypertriglyceridemia   . Hypercalcemia     Due to HCTZ, improved with d/c of calcium supplements  . Nephrolithiasis   . Osteoporosis   . Tobacco abuse   . Back pain     Recent positive ANA   Assessment: 56 year old female with cardiac risk factors presents to [facility] with chest pressure and abnormal ECG. Will start IV heparin with possible plans for cardiac cath later today.  Goal of Therapy:  Heparin level 0.3-0.7 units/ml Monitor platelets by anticoagulation protocol: Yes   Plan:  1.Heparin iv bolus of 4000 units x 1 2.Heparin drip rate of 950 units/hr 3.Heparin level in 6 hours if not going to cath Severiano Gilbert 04/22/2012,3:37 PM

## 2012-04-22 NOTE — ED Provider Notes (Signed)
History     CSN: [id number]  Arrival date & time 04/22/12  1029   First MD Initiated Contact with Patient 04/22/12 1043      Chief Complaint  Patient presents with  . 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 She denies any chest pain. He says he intermittently has pressure in the left side of her chest it radiates to her back. It comes on with exertion and improves with rest. She denies any previous cardiac history. She seen her Dr. today for regular physical. When she gets the pressure she feels short of breath with nausea and diaphoresis. She denies any leg pain or swelling. She denies abdominal pain or vomiting. Is no pain or pressure at this time.  The history is provided by the patient.    Past Medical History  Diagnosis Date  . Hypertension   . Thyroid disease   . High cholesterol     Past Surgical History  Procedure Date  . Bladder surgery     No family history on file.  History  Substance Use Topics  . Smoking status: Current Everyday Smoker  . Smokeless tobacco: Not on file  . Alcohol Use: No    OB History    Grav Para Term Preterm Abortions TAB SAB Ect Mult Living                  Review of Systems  Constitutional: Negative for fever, activity change and appetite change.  HENT: Negative for congestion and rhinorrhea.   Respiratory: Positive for chest tightness and shortness of breath.   Cardiovascular: Positive for chest pain.  Gastrointestinal: Negative for nausea, vomiting and abdominal pain.  Genitourinary: Negative for dysuria, vaginal bleeding and vaginal discharge.  Musculoskeletal: Negative for back pain.  Skin: Negative for rash.  Neurological: Negative for headaches.    Allergies  Review of patient's allergies  indicates no known allergies.  Home Medications   Current Outpatient Rx  Name Route Sig Dispense Refill  . ALENDRONATE SODIUM 70 MG PO TABS Oral Take 70 mg by mouth every 7 (seven) days. Take with a full glass of water on an empty stomach.    . ALPRAZOLAM 0.25 MG PO TABS Oral Take 0.25 mg by mouth 2 (two) times daily.    Marland Kitchen AMLODIPINE BESYLATE 10 MG PO TABS Oral Take 10 mg by mouth daily.      Marland Kitchen VITAMIN D-3 PO Oral Take 2,000 Units by mouth daily.     Marland Kitchen LEVOTHYROXINE SODIUM 112 MCG PO TABS Oral Take 112 mcg by mouth daily.      Marland Kitchen LOSARTAN POTASSIUM 50 MG PO TABS Oral Take 50 mg by mouth daily.    . MULTI-VITAMIN/MINERALS PO TABS Oral Take 1 tablet by mouth daily.    Marland Kitchen NIACIN ER (ANTIHYPERLIPIDEMIC) 1000 MG PO TBCR Oral Take 1,500 mg by mouth daily.       BP 150/86  Pulse 62  Temp 98.5 F (36.9 C) (Oral)  Resp 18  SpO2 99%  Physical Exam  Constitutional: She is oriented to person, place, and time. She appears well-developed and well-nourished. No distress.  HENT:  Head: Normocephalic and atraumatic.  Mouth/Throat: Oropharynx is clear and moist. No oropharyngeal exudate.  Eyes: Conjunctivae and EOM are normal. Pupils are equal, round, and reactive to light.  Neck: Normal range of motion.  Cardiovascular: Normal rate, regular rhythm and normal heart sounds.   No murmur heard. Pulmonary/Chest: Effort normal and breath sounds normal. No respiratory distress.  Abdominal: Soft. There is no tenderness. There is no rebound and no guarding.  Musculoskeletal: Normal range of motion. She exhibits no edema and no tenderness.  Neurological: She is alert and oriented to person, place, and time. No cranial nerve deficit.  Skin: Skin is warm.    ED Course  Procedures (including critical care time)   Labs Reviewed  CBC  DIFFERENTIAL  COMPREHENSIVE METABOLIC PANEL  PROTIME-INR  CARDIAC PANEL(CRET KIN+CKTOT+MB+TROPI)  URINALYSIS, ROUTINE W REFLEX MICROSCOPIC   Dg Chest 2  View  04/22/2012  *RADIOLOGY REPORT*  Clinical Data: Chest pain.  CHEST - 2 VIEW  Comparison: 11/12/2011  Findings: Heart and mediastinal contours are within normal limits. No focal opacities or effusions.  No acute bony abnormality.  IMPRESSION: No active cardiopulmonary disease.  Original Report Authenticated By: Cyndie Chime, M.D.     No diagnosis found.    MDM  Intermittent chest pressure with diaphoresis, shortness of breath, nausea concerning for angina. Pain comes on with exertion and improves with rest. EKG changes concerning for acute coronary syndrome.  X-ray negative. Troponin negative. We'll obtain consult from cardiology given EKG changes and story concerning for angina.   Date: 04/22/2012  Rate: 60  Rhythm: normal sinus rhythm  QRS Axis: normal  Intervals: normal  ST/T Wave abnormalities: nonspecific T wave changes  Conduction Disutrbances:none  Narrative Interpretation: T wave inversions in lead 3, aVF, V2, V3, V4, V5  Old EKG Reviewed: changes noted    Glynn Octave, MD 04/22/12 1259

## 2012-04-22 NOTE — H&P (Addendum)
History and Physical  Patient ID: Stacy Hernandez MRN: [id number], DOB: 03/31/1956 Date of Encounter: 04/22/2012, 2:39 PM Primary Physician: Ron Agee, FNP Primary Cardiologist: New to [place]  Chief Complaint: chest pain  HPI: 56 yo with history of HTN, hypertriglyceridemia, and smoking presented with chest pressure and abnormal ECG.  Patient has had about 2 months of exertional chest pressure, resolving with rest.  She will get chest pressure and dyspnea walking up steps or up an incline, vacuuming, and doing other heavier housework.  Episodes have been more frequent in the last week.  She has no prior cardiac history.  She was seen by her PCP today for routine followup and had about 15 minutes of chest pressure at rest in her PCP's office.  ECG showed anterior T wave inversions, new from prior ECGs.  She was told to go to the ER.  In the ER, ECG indeed shows diffuse worrisome anterior T wave inversions that were not seen on the prior ECG in 10/12.  She is chest pain/pressure - free.  She is an active smoker.  BP has been running high in the ER but she says that it tends to be reasonably controlled in general on her home regimen.   ROS: All systems reviewed and negative except as per HPI.   Past Medical History  Diagnosis Date  . Hypertension   . Thyroid disease     Post-ablation hypothyroidism  . Hypertriglyceridemia   . Hypercalcemia     Due to HCTZ, improved with d/c of calcium supplements  . Nephrolithiasis   . Osteoporosis   . Tobacco abuse   . Back pain     Recent positive ANA     Most Recent Cardiac Studies: None   Surgical History:  Past Surgical History  Procedure Date  . Bladder surgery   . Partial hysterectomy     Due to cervical dysplasia  . Cesarean section   . Tonsillectomy   . Carpal tunnel release      Home Meds: Prior to Admission medications   Medication Sig Start Date End Date Taking? Authorizing Provider  alendronate (FOSAMAX) 70 MG  tablet Take 70 mg by mouth every 7 (seven) days. Take with a full glass of water on an empty stomach.   Yes Historical Provider, MD  ALPRAZolam (XANAX) 0.25 MG tablet Take 0.25 mg by mouth 2 (two) times daily.   Yes Historical Provider, MD  amLODipine (NORVASC) 10 MG tablet Take 10 mg by mouth daily.     Yes Historical Provider, MD  Cholecalciferol (VITAMIN D-3 PO) Take 2,000 Units by mouth daily.    Yes Historical Provider, MD  levothyroxine (SYNTHROID, LEVOTHROID) 112 MCG tablet Take 112 mcg by mouth daily.     Yes Historical Provider, MD  losartan (COZAAR) 50 MG tablet Take 50 mg by mouth daily.   Yes Historical Provider, MD  Multiple Vitamins-Minerals (MULTIVITAMIN WITH MINERALS) tablet Take 1 tablet by mouth daily.   Yes Historical Provider, MD  niacin (NIASPAN) 1000 MG CR tablet Take 1,500 mg by mouth daily.    Yes Historical Provider, MD    Allergies: No Known Allergies  History   Social History  . Marital Status: Married    Spouse Name: N/A    Number of Children: N/A  . Years of Education: N/A   Occupational History  . Not on file.   Social History Main Topics  . Smoking status: Current Everyday Smoker  . Smokeless tobacco: Not on file   Comment:  35 years - 2ppd  . Alcohol Use: No  . Drug Use: Not on file  . Sexually Active: Not on file   Other Topics Concern  . Not on file   Social History Narrative  . No narrative on file     Family History  Problem Relation Age of Onset  . Coronary artery disease Father     s/p CABG in his 41s  . COPD Mother   . Diabetes Sister   . Cirrhosis Sister    Labs:   Lab Results  Component Value Date   WBC 8.2 04/22/2012   HGB 13.3 04/22/2012   HCT 38.9 04/22/2012   MCV 92.0 04/22/2012   PLT 182 04/22/2012    Lab 04/22/12 1117  NA 141  K 4.1  CL 107  CO2 24  BUN 12  CREATININE 0.57  CALCIUM 9.7  PROT 7.0  BILITOT 0.3  ALKPHOS 79  ALT 14  AST 18  GLUCOSE 88    Basename 04/22/12 1117  CKTOTAL 57  CKMB 1.9    TROPONINI <0.30   Radiology/Studies:  1. Chest 2 View 04/22/2012  *RADIOLOGY REPORT*  Clinical Data: Chest pain.  CHEST - 2 VIEW  Comparison: 11/12/2011  Findings: Heart and mediastinal contours are within normal limits. No focal opacities or effusions.  No acute bony abnormality.  IMPRESSION: No active cardiopulmonary disease.  Original Report Authenticated By: Cyndie Chime, M.D.     EKG: NSR 60bpm TWI V1-V6 (max up to 3mm in V3-V4) - TWI is new from prior in 10/12  Physical Exam: Blood pressure 148/76, pulse 60, temperature 98.5 F (36.9 C), temperature source Oral, resp. rate 17, SpO2 99.00%. General: Well developed, well nourished, in no acute distress. Head: Normocephalic, atraumatic, sclera non-icteric, no xanthomas, nares are without discharge.  Neck: Negative for carotid bruits. JVD not elevated. Lungs: Mildly decreased breath sounds bilaterally. Breathing is unlabored. Heart: RRR with S1 S2. 2/6 early SEM RUSB. Abdomen: Soft, non-tender, non-distended with normoactive bowel sounds. No hepatomegaly. No rebound/guarding. No obvious abdominal masses. Msk:  Strength and tone appear normal for age. Extremities: No clubbing or cyanosis. No edema.  Distal pedal pulses are 2+ and equal bilaterally. Neuro: Alert and oriented X 3. Moves all extremities spontaneously. Psych:  Responds to questions appropriately with a normal affect.    ASSESSMENT AND PLAN:  56 yo smoker with history of HTN and hypertriglyceridemia presents with symptoms concerning for unstable angina.  1. Chest pressure: Exertional chest pressure with symptoms at rest today.   Markedly changed ECG with anterior T wave inversions.  High suspicion for obstructive CAD with unstable angina.  - ASA, heparin gtt, atorvastatin 80.  - check lipids in am.  - Cath today versus in am.  2. Smoking: Needs to stop.  Nicotine patch to be provided.  3. HTN: Continue home meds for now, follow.  4. Systolic murmur: Suspect aortic  sclerosis or mild stenosis.  Evaluate with echo.   [name] 04/22/2012 2:55 PM

## 2012-04-23 ENCOUNTER — Encounter (HOSPITAL_COMMUNITY): Payer: Self-pay | Admitting: Physician Assistant

## 2012-04-23 DIAGNOSIS — I517 Cardiomegaly: Secondary | ICD-10-CM

## 2012-04-23 DIAGNOSIS — I200 Unstable angina: Secondary | ICD-10-CM

## 2012-04-23 LAB — BASIC METABOLIC PANEL
BUN: 14 mg/dL (ref 6–23)
CO2: 25 mEq/L (ref 19–32)
Calcium: 9.3 mg/dL (ref 8.4–10.5)
Chloride: 111 mEq/L (ref 96–112)
Creatinine, Ser: 0.57 mg/dL (ref 0.50–1.10)
GFR calc Af Amer: >90 mL/min (ref >90)
GFR calc non Af Amer: >90 mL/min (ref >90)
Glucose, Bld: 93 mg/dL (ref 70–99)
Potassium: 4 mEq/L (ref 3.5–5.1)
Sodium: 142 mEq/L (ref 135–145)

## 2012-04-23 LAB — CARDIAC PANEL(CRET KIN+CKTOT+MB+TROPI)
CK, MB: 1.6 ng/mL (ref 0.3–4.0)
Relative Index: INVALID (ref 0.0–2.5)
Total CK: 41 U/L (ref 7–177)
Troponin I: <0.3 ng/mL (ref <0.30)

## 2012-04-23 LAB — LIPID PANEL
Cholesterol: 146 mg/dL (ref 0–200)
HDL: 43 mg/dL (ref >39)
LDL Cholesterol: 90 mg/dL (ref 0–99)
Total CHOL/HDL Ratio: 3.4 RATIO
Triglycerides: 64 mg/dL (ref <150)
VLDL: 13 mg/dL (ref 0–40)

## 2012-04-23 LAB — CBC
HCT: 36.8 % (ref 36.0–46.0)
Hemoglobin: 12.3 g/dL (ref 12.0–15.0)
MCH: 31 pg (ref 26.0–34.0)
MCHC: 33.4 g/dL (ref 30.0–36.0)
MCV: 92.7 fL (ref 78.0–100.0)
Platelets: 141 10*3/uL — ABNORMAL LOW (ref 150–400)
RBC: 3.97 MIL/uL (ref 3.87–5.11)
RDW: 13.1 % (ref 11.5–15.5)
WBC: 8.1 10*3/uL (ref 4.0–10.5)

## 2012-04-23 MED ORDER — ATORVASTATIN CALCIUM 10 MG PO TABS: 10.0000 mg | ORAL_TABLET | Freq: Every day | ORAL | Status: DC

## 2012-04-23 MED ORDER — ASPIRIN 81 MG PO TBEC: 81.0000 mg | DELAYED_RELEASE_TABLET | Freq: Every day | ORAL | Status: AC

## 2012-04-23 NOTE — Progress Notes (Signed)
Pt discharged home. All iv's removed with catheter intact. Pt and husband verablized understanding of all discharge instructions. All belongings with patient.

## 2012-04-23 NOTE — Discharge Summary (Signed)
Discharge Summary   Patient ID: CAMIRA GEIDEL MRN: [id number], DOB/AGE: Apr 26, 1956 56 y.o. Admit date: 04/22/2012 D/C date:     04/23/2012   Primary Discharge Diagnoses:  1. Chest pain with EKG changes - mild nonobstructive CAD by cath on 04/22/12 - d-dimer WNL and cardiac enzymes negative - EKG changes suspected due to repol abnormality from going in/out of LBBB - statin initiated so will need f/u LFTs/lipids as outpatient 2. Intermittent LBBB 3. Tobacco abuse 4. Mild LV dysfunction with EF 45-50% by echo 04/23/12  Secondary Discharge Diagnoses:  1. HTN 2. H/o hypertriglyceridemia  Hospital Course: Ms. Bantz is a 56 year old F with a hx of HTN, tobacco abuse who presented with intermittent chest discomfort/pressure typically occurring with exertion but also more recently occurring at rest and with emotional stress. Initially she attributed it to smoking and deconditining, but yesterday when she presented to PCP for a routine physical was found to have diffuse worrisome anterior T wave changes so was sent to the ER for eval. Her cardiac markers were negative but story was concerning for unstable angina in light of extensive tobacco use. She was taken to the cath lab for definitive analysis of her anatomy which only showed nonobstructive scattered coronary irregularities without high grade obstruction. She also was noted to have an intermittent LBBB during the case which was felt to possibly explain the anterior T wave changes. D-dimer was checked which was normal. The patient was not hypoxic, tachycardic or tachynic and there were no signs of DVT. She was instructed to stop smoking. 2D echo was obtained for a heart murmur prior to discharge which demonstrated mild LVH, EF 45-50%, mildly dilated LA/RA, with mildly thickened aortic leaflets. She is currently maintained on an ARB. Dr. Shirlee Latch recommended med rx for mild CAD including ASA 81mg  daily and low-dose lipitor 10mg  daily. He has seen and  examined the patient and feels she is stable for discharge. The patient was told she cannot lift anything over 15lbs for 2 weeks and apparently has a very physical job so will be written out of work until 7/3 - she actually already has that week off so will be out of work until after that time.  Discharge Vitals: Blood pressure 151/65, pulse 65, temperature 98.1 F (36.7 C), temperature source Oral, resp. rate 19, height 5\' 2"  (1.575 m), weight 149 lb 14.6 oz (68 kg), SpO2 97.00%.  Labs: Lab Results  Component Value Date   WBC 8.1 04/23/2012   HGB 12.3 04/23/2012   HCT 36.8 04/23/2012   MCV 92.7 04/23/2012   PLT 141* 04/23/2012     Lab 04/23/12 0306 04/22/12 1117  NA 142 --  K 4.0 --  CL 111 --  CO2 25 --  BUN 14 --  CREATININE 0.57 --  CALCIUM 9.3 --  PROT -- 7.0  BILITOT -- 0.3  ALKPHOS -- 79  ALT -- 14  AST -- 18  GLUCOSE 93 --    Basename 04/23/12 0305 04/22/12 2107 04/22/12 1538 04/22/12 1117  CKTOTAL 41 44 49 57  CKMB 1.6 1.7 1.9 1.9  TROPONINI <0.30 <0.30 <0.30 <0.30   Lab Results  Component Value Date   CHOL 146 04/23/2012   HDL 43 04/23/2012   LDLCALC 90 04/23/2012   TRIG 64 04/23/2012   Lab Results  Component Value Date   DDIMER 0.34 04/22/2012    Diagnostic Studies/Procedures   1. Chest 2 View 04/22/2012  *RADIOLOGY REPORT*  Clinical Data: Chest pain.  CHEST - 2 VIEW  Comparison: 11/12/2011  Findings: Heart and mediastinal contours are within normal limits. No focal opacities or effusions.  No acute bony abnormality.  IMPRESSION: No active cardiopulmonary disease.  Original Report Authenticated By: Cyndie Chime, M.D.   2. Cardiac catheterization this admission, please see full report and above for summary.  3. 2D echo, see results in EMR.  Discharge Medications   Medication List  As of 04/23/2012  2:59 PM   TAKE these medications         alendronate 70 MG tablet   Commonly known as: FOSAMAX   Take 70 mg by mouth every 7 (seven) days. Take with a full  glass of water on an empty stomach.      ALPRAZolam 0.25 MG tablet   Commonly known as: XANAX   Take 0.25 mg by mouth 2 (two) times daily.      amLODipine 10 MG tablet   Commonly known as: NORVASC   Take 10 mg by mouth daily.      aspirin 81 MG EC tablet   Take 1 tablet (81 mg total) by mouth daily.      atorvastatin 10 MG tablet   Commonly known as: LIPITOR   Take 1 tablet (10 mg total) by mouth at bedtime.      levothyroxine 112 MCG tablet   Commonly known as: SYNTHROID, LEVOTHROID   Take 112 mcg by mouth daily.      losartan 50 MG tablet   Commonly known as: COZAAR   Take 50 mg by mouth daily.      multivitamin with minerals tablet   Take 1 tablet by mouth daily.      NIASPAN 1000 MG CR tablet   Generic drug: niacin   Take 1,500 mg by mouth daily.      VITAMIN D-3 PO   Take 2,000 Units by mouth daily.            Disposition   The patient will be discharged in stable condition to home. Discharge Orders    Future Appointments: Provider: Department: Dept Phone: Center:   05/07/2012 11:00 AM Rosalio Macadamia, NP [facility] [phone] None     Future Orders Please Complete By Expires   Diet - low sodium heart healthy      Increase activity slowly      Comments:   No driving for 2 days. No lifting over 5 lbs for 1 week. No sexual activity for 1 week. You may return to work on 05/07/12. Keep procedure site clean & dry. If you notice increased pain, swelling, bleeding or pus, call/return!  You may shower, but no soaking baths/hot tubs/pools for 1 week.  YOU MUST STOP SMOKING!     Follow-up Information    Follow up with Norma Fredrickson, NP. (05/07/12 on 11am)    Contact information:   [facility] [address] [place] [address] [phone]            Duration of Discharge Encounter: Greater than 30 minutes including physician and PA time.  Signed, Ronie Spies PA-C 04/23/2012, 2:59 PM

## 2012-04-23 NOTE — Progress Notes (Signed)
Patient ID: Stacy Hernandez, female   DOB: Apr 16, 1956, 56 y.o.   MRN: [id number]    SUBJECTIVE: No further chest pain.  Cath yesterday with mild CAD, no explanation for chest pain.  She has LBBB on ECG this am.  D dimer was negative.   Current Facility-Administered Medications  Medication Dose Route Frequency Provider Last Rate Last Dose  . 0.9 %  sodium chloride infusion   Intravenous Continuous Herby Abraham, MD      . acetaminophen (TYLENOL) tablet 650 mg  650 mg Oral Q4H PRN Laurann [place], PA      . acetaminophen (TYLENOL) tablet 650 mg  650 mg Oral Q4H PRN Herby Abraham, MD      . ALPRAZolam Prudy Feeler) tablet 0.25 mg  0.25 mg Oral BID Laurann [place], PA   0.25 mg at 04/22/12 2222  . amLODipine (NORVASC) tablet 10 mg  10 mg Oral Daily Dayna N Dunn, PA   10 mg at 04/22/12 2217  . aspirin chewable tablet 324 mg  324 mg Oral NOW Dayna N Dunn, PA   324 mg at 04/22/12 1614   Or  . aspirin suppository 300 mg  300 mg Rectal NOW Dayna N Dunn, PA      . aspirin EC tablet 81 mg  81 mg Oral Daily Dayna N Dunn, PA      . atorvastatin (LIPITOR) tablet 80 mg  80 mg Oral q1800 Dayna N Dunn, PA      . cholecalciferol (VITAMIN D) tablet 2,000 Units  2,000 Units Oral Daily Laurann [place], PA   2,000 Units at 04/22/12 1628  . fentaNYL (SUBLIMAZE) 0.05 MG/ML injection           . heparin 2-0.9 UNIT/ML-% infusion           . levothyroxine (SYNTHROID, LEVOTHROID) tablet 112 mcg  112 mcg Oral Daily Dayna N Dunn, PA      . lidocaine (XYLOCAINE) 1 % injection           . losartan (COZAAR) tablet 50 mg  50 mg Oral Daily Dayna N Dunn, PA   50 mg at 04/22/12 1629  . midazolam (VERSED) 2 MG/2ML injection           . multivitamin with minerals tablet 1 tablet  1 tablet Oral Daily Laurann [place], PA   1 tablet at 04/22/12 1629  . niacin CR capsule 1,500 mg  1,500 mg Oral QHS Dayna N Dunn, PA   1,500 mg at 04/22/12 2217  . nicotine (NICODERM CQ - dosed in mg/24 hours) patch 14 mg  14 mg Transdermal Daily Dayna N Dunn, PA    14 mg at 04/22/12 2216  . nitroGLYCERIN (NITROSTAT) SL tablet 0.4 mg  0.4 mg Sublingual Q5 Min x 3 PRN Dayna N Dunn, PA      . nitroGLYCERIN (NTG ON-CALL) 0.2 mg/mL injection           . ondansetron (ZOFRAN) injection 4 mg  4 mg Intravenous Q6H PRN Dayna N Dunn, PA      . ondansetron (ZOFRAN) injection 4 mg  4 mg Intravenous Q6H PRN Herby Abraham, MD      . DISCONTD: 0.9 %  sodium chloride infusion   Intravenous Continuous Dayna N Dunn, PA      . DISCONTD: aspirin chewable tablet 324 mg  324 mg Oral Pre-Cath Dayna N Dunn, PA      . DISCONTD: heparin ADULT infusion 100 units/mL (25000 units/250  mL)  950 Units/hr Intravenous Continuous Severiano Gilbert, [place]      . DISCONTD: heparin bolus via infusion 3,500 Units  3,500 Units Intravenous Once Severiano Gilbert, PHARMD          Filed Vitals:   04/22/12 2310 04/23/12 0005 04/23/12 0348 04/23/12 0728  BP:  121/50 128/54 127/53  Pulse: 60 61 68 65  Temp: 98.1 F (36.7 C)  97.5 F (36.4 C) 98.1 F (36.7 C)  TempSrc: Oral  Oral Oral  Resp:  18 18 16   Height:      Weight:      SpO2:  93% 98% 98%    Intake/Output Summary (Last 24 hours) at 04/23/12 0803 Last data filed at 04/23/12 0000  Gross per 24 hour  Intake    840 ml  Output    750 ml  Net     90 ml    LABS: Basic Metabolic Panel:  Basename 04/23/12 0306 04/22/12 1117  NA 142 141  K 4.0 4.1  CL 111 107  CO2 25 24  GLUCOSE 93 88  BUN 14 12  CREATININE 0.57 0.57  CALCIUM 9.3 9.7  MG -- --  PHOS -- --   Liver Function Tests:  Basename 04/22/12 1117  AST 18  ALT 14  ALKPHOS 79  BILITOT 0.3  PROT 7.0  ALBUMIN 3.7   No results found for this basename: LIPASE:2,AMYLASE:2 in the last 72 hours CBC:  Basename 04/23/12 0306 04/22/12 1117  WBC 8.1 8.2  NEUTROABS -- 4.9  HGB 12.3 13.3  HCT 36.8 38.9  MCV 92.7 92.0  PLT 141* 182   Cardiac Enzymes:  Basename 04/23/12 0305 04/22/12 2107 04/22/12 1538  CKTOTAL 41 44 49  CKMB 1.6 1.7 1.9  CKMBINDEX -- --  --  TROPONINI <0.30 <0.30 <0.30   BNP: No components found with this basename: POCBNP:3 D-Dimer:  Basename 04/22/12 1729  DDIMER 0.34   Hemoglobin A1C: No results found for this basename: HGBA1C in the last 72 hours Fasting Lipid Panel:  Basename 04/23/12 0306  CHOL 146  HDL 43  LDLCALC 90  TRIG 64  CHOLHDL 3.4  LDLDIRECT --   Thyroid Function Tests:  Basename 04/22/12 1540  TSH 0.563  T4TOTAL --  T3FREE --  THYROIDAB --   Anemia Panel: No results found for this basename: VITAMINB12,FOLATE,FERRITIN,TIBC,IRON,RETICCTPCT in the last 72 hours  RADIOLOGY: Dg Chest 2 View  04/22/2012  *RADIOLOGY REPORT*  Clinical Data: Chest pain.  CHEST - 2 VIEW  Comparison: 11/12/2011  Findings: Heart and mediastinal contours are within normal limits. No focal opacities or effusions.  No acute bony abnormality.  IMPRESSION: No active cardiopulmonary disease.  Original Report Authenticated By: Cyndie Chime, M.D.    PHYSICAL EXAM General: NAD Neck: No JVD, no thyromegaly or thyroid nodule.  Lungs: Clear to auscultation bilaterally with normal respiratory effort. CV: Nondisplaced PMI.  Heart regular S1/S2, no S3/S4, 2/6 SEM RUSB.  No peripheral edema.  No carotid bruit.  Normal pedal pulses.  Abdomen: Soft, nontender, no hepatosplenomegaly, no distention.  Neurologic: Alert and oriented x 3.  Psych: Normal affect. Extremities: No clubbing or cyanosis.   TELEMETRY: Reviewed telemetry pt in NSR  ASSESSMENT AND PLAN: 56 yo smoker with history of HTN and hypertriglyceridemia presented with symptoms concerning for unstable angina with anterior ECG changes. 1. Chest pain: Mild CAD only.  She has been noted to go in and out of LBBB in the hospital.  I suspect that the anterior T  wave inversions when she is not in LBBB are due to a repolarization abnormality from going in and out of LBBB.  D dimer was negative so doubt PE.  2. Murmur: Echo this morning.  3. Needs to quit smoking.  4.  Disposition: May go home today after echo.  Would continue home meds but add low dose statin (atorvastatin 10 mg daily) and ASA 81 mg daily given mild CAD.   [name] 04/23/2012 8:06 AM

## 2012-04-24 ENCOUNTER — Telehealth: Payer: Self-pay | Admitting: Cardiology

## 2012-04-24 NOTE — Telephone Encounter (Signed)
Spoke with pt. Pt requesting letter to be out of work from 04/22/12  instead of 04/23/12 to 05/09/12. Pt states employer is closed the week of 05/09/12. She will not get paid for that week. If the note states she should be out of work until 05/09/12 she would be paid for the entire week. I will review with Dr Shirlee Latch.

## 2012-04-24 NOTE — Telephone Encounter (Signed)
Pt needs new letter per her job for  04-22-12 to 05-09-12 rtn to work on 05-12-12 , easier for her to get paid if does the whole week, pls call when/if ready, also wants to move appt with lori out a week, is this ok

## 2012-04-25 NOTE — Telephone Encounter (Signed)
Pt's husband to office and picked up with out of work dates 04/22/12-05/09/12.

## 2012-04-25 NOTE — Telephone Encounter (Signed)
Follow up on previous call:  Returning call back to nurse.   

## 2012-04-25 NOTE — Telephone Encounter (Signed)
Reviewed with Dr Shirlee Latch. OK for pt to be out of work from 04/22/12-05/09/12. LMTCB

## 2012-05-07 ENCOUNTER — Ambulatory Visit: Payer: Self-pay | Admitting: Nurse Practitioner

## 2012-05-14 ENCOUNTER — Ambulatory Visit (INDEPENDENT_AMBULATORY_CARE_PROVIDER_SITE_OTHER): Payer: BC Managed Care – PPO | Admitting: Nurse Practitioner

## 2012-05-14 ENCOUNTER — Encounter: Payer: Self-pay | Admitting: Nurse Practitioner

## 2012-05-14 VITALS — BP 120/72 | HR 75 | Ht 62.0 in | Wt 155.8 lb

## 2012-05-14 DIAGNOSIS — I2510 Atherosclerotic heart disease of native coronary artery without angina pectoris: Secondary | ICD-10-CM

## 2012-05-14 DIAGNOSIS — I519 Heart disease, unspecified: Secondary | ICD-10-CM

## 2012-05-14 DIAGNOSIS — F17200 Nicotine dependence, unspecified, uncomplicated: Secondary | ICD-10-CM

## 2012-05-14 DIAGNOSIS — I10 Essential (primary) hypertension: Secondary | ICD-10-CM

## 2012-05-14 DIAGNOSIS — E785 Hyperlipidemia, unspecified: Secondary | ICD-10-CM

## 2012-05-14 DIAGNOSIS — Z720 Tobacco use: Secondary | ICD-10-CM

## 2012-05-14 NOTE — Assessment & Plan Note (Signed)
We will plan on rechecking her labs in 4 weeks. She did switch to an OTC niacin earlier this year. Now on Lipitor. Goal LDL will be less than 70.

## 2012-05-14 NOTE — Assessment & Plan Note (Signed)
She is not smoking. She is congratulated.  

## 2012-05-14 NOTE — Assessment & Plan Note (Signed)
Blood pressure is doing well. No change in her current therapy.

## 2012-05-14 NOTE — Progress Notes (Signed)
Stacy Hernandez Date of Birth: [birth year] Medical Record #[id number]  History of Present Illness: Stacy Hernandez is seen today for a post hospital visit. She is seen for Dr. Shirlee Latch. She is a 56 year old female with a history of tobacco abuse, intermittent LBBB and mild LV dysfunction with an EF of 45 to 50% per recent echo on June 19/2013. She was recently admitted with chest pain. Story was quite concerning for CAD etiology. She was cathed which showed nonobstructive CAD without high grade obstruction. She was placed on ASA/statin and advised to stop smoking. She is on ARB therapy. Her other problems include hypothyroidism, HLD and HTN.  She comes in today. She is here alone. She is doing very well. No chest pain. BP is doing well at home. She feels good on her medicines. She is not smoking and is using the patch. She has returned to work. She is now on some Lipitor. She had stopped her Niaspan earlier in the year due to cost and is using an OTC no flush agent. She will need repeat labs in about 4 more weeks. Overall, she thinks she is doing well. No problems with her groin reported.   Current Outpatient Prescriptions on File Prior to Visit  Medication Sig Dispense Refill  . alendronate (FOSAMAX) 70 MG tablet Take 70 mg by mouth every 7 (seven) days. Take with a full glass of water on an empty stomach.      . ALPRAZolam (XANAX) 0.25 MG tablet Take 0.25 mg by mouth 2 (two) times daily.      Marland Kitchen amLODipine (NORVASC) 10 MG tablet Take 10 mg by mouth daily.        Marland Kitchen aspirin EC 81 MG EC tablet Take 1 tablet (81 mg total) by mouth daily.      Marland Kitchen atorvastatin (LIPITOR) 10 MG tablet Take 1 tablet (10 mg total) by mouth at bedtime.  30 tablet  6  . Cholecalciferol (VITAMIN D-3 PO) Take 2,000 Units by mouth daily.       Marland Kitchen levothyroxine (SYNTHROID, LEVOTHROID) 112 MCG tablet Take 112 mcg by mouth daily.        Marland Kitchen losartan (COZAAR) 50 MG tablet Take 50 mg by mouth daily.      . Multiple Vitamins-Minerals  (MULTIVITAMIN WITH MINERALS) tablet Take 1 tablet by mouth daily.        No Known Allergies  Past Medical History  Diagnosis Date  . Hypertension   . Thyroid disease     Post-ablation hypothyroidism  . Hypertriglyceridemia   . Hypercalcemia     Due to HCTZ, improved with d/c of calcium supplements  . Nephrolithiasis   . Osteoporosis   . Tobacco abuse   . Back pain     Recent positive ANA  . LV dysfunction     EF 45-50% by echo 04/23/2012  . CAD (coronary artery disease)     per cath in June 2013 with nonobstructive CAD    Past Surgical History  Procedure Date  . Bladder surgery   . Partial hysterectomy     Due to cervical dysplasia  . Cesarean section   . Tonsillectomy   . Carpal tunnel release     History  Smoking status  . Former Smoker  . Quit date: 04/23/2012  Smokeless tobacco  . Not on file  Comment: 35 years - 2ppd    History  Alcohol Use No    Family History  Problem Relation Age of Onset  . Coronary  artery disease Father     s/p CABG in his 77s  . COPD Mother   . Diabetes Sister   . Cirrhosis Sister     Review of Systems: The review of systems is per the HPI.  All other systems were reviewed and are negative.  Physical Exam: BP 120/72  Pulse 75  Ht 5\' 2"  (1.575 m)  Wt 155 lb 12.8 oz (70.67 kg)  BMI 28.50 kg/m2 Patient is pleasant and in no acute distress. Skin is warm and dry. Color is normal.  She is suntanned. HEENT is unremarkable. Normocephalic/atraumatic. PERRL. Sclera are nonicteric. Neck is supple. No masses. No JVD. Lungs are coarse. Cardiac exam shows a regular rate and rhythm. Soft outflow murmur noted. Abdomen is soft. Extremities are without edema. Gait and ROM are intact. No gross neurologic deficits noted.  LABORATORY DATA:  Lab Results  Component Value Date   WBC 8.1 04/23/2012   HGB 12.3 04/23/2012   HCT 36.8 04/23/2012   PLT 141* 04/23/2012   GLUCOSE 93 04/23/2012   CHOL 146 04/23/2012   TRIG 64 04/23/2012   HDL 43  04/23/2012   LDLCALC 90 04/23/2012   ALT 14 04/22/2012   AST 18 04/22/2012   NA 142 04/23/2012   K 4.0 04/23/2012   CL 111 04/23/2012   CREATININE 0.57 04/23/2012   BUN 14 04/23/2012   CO2 25 04/23/2012   TSH 0.563 04/22/2012   INR 1.05 04/22/2012   Echo Study Conclusions from June 2013  - Left ventricle: The cavity size was normal. Wall thickness was increased in a pattern of mild LVH. Systolic function was mildly reduced. The estimated ejection fraction was in the range of 45% to 50%. Wall motion was normal; there were no regional wall motion abnormalities. Left ventricular diastolic function parameters were normal. - Ventricular septum: Septal motion showed paradox. - Left atrium: The atrium was mildly dilated. - Right atrium: The atrium was mildly dilated. - Atrial septum: No defect or patent foramen ovale was identified.  Cardiac Catheterization Final Conclusions:  1. Scattered coronary irregularities without high grade obstruction--as noted above  2. Preserved LV function  3. Intermittent BBB observed in the cath lab during the case (? Explaining T inversion)   Recommendations:  1. Check D-dimer  2, Patient must stop smoking.  3. Consider 2D echocardiography.   [name]  04/22/2012, 6:02 PM   Assessment / Plan:

## 2012-05-14 NOTE — Assessment & Plan Note (Signed)
She is s/p recent cath. Has nonobstructive CAD with an intermittent L BBB. She is now on statin therapy. She is not smoking. She will continue with her current medicines. Will see her back in 4 months. Patient is agreeable to this plan and will call if any problems develop in the interim.

## 2012-05-14 NOTE — Assessment & Plan Note (Signed)
She has mild LV dysfunction per her echo but it is normal by her cath. She is on ARB therapy.

## 2012-05-14 NOTE — Patient Instructions (Signed)
Congratulations for not smoking  We will check fasting labs in one month  Stay on your current medicines  We will see you in 4 months with Dr. Shirlee Latch  Call the [facility] office at [phone] if you have any questions, problems or concerns.

## 2012-05-20 ENCOUNTER — Telehealth: Payer: Self-pay | Admitting: Cardiology

## 2012-05-20 NOTE — Telephone Encounter (Signed)
FMLA & Disability Forms faxed to pt's Job HR Dept @ [phone] Form faxed back to  [facility] @ [phone] originals to pt home address, Pt aware 05/20/12/KM

## 2012-05-20 NOTE — Telephone Encounter (Signed)
Please return call to patient [phone]   Patient would like return call with status Return to work docs, and family leave paperwork

## 2012-05-23 ENCOUNTER — Telehealth: Payer: Self-pay | Admitting: Cardiology

## 2012-05-23 NOTE — Telephone Encounter (Signed)
New msg [employer] disability office had some more questions about his disability claim

## 2012-05-23 NOTE — Telephone Encounter (Signed)
I spoke with Selena Batten in [facility] and she will take care of this.

## 2012-06-13 ENCOUNTER — Other Ambulatory Visit: Payer: BC Managed Care – PPO

## 2012-06-17 ENCOUNTER — Other Ambulatory Visit (INDEPENDENT_AMBULATORY_CARE_PROVIDER_SITE_OTHER): Payer: BC Managed Care – PPO

## 2012-06-17 DIAGNOSIS — E785 Hyperlipidemia, unspecified: Secondary | ICD-10-CM

## 2012-06-17 LAB — BASIC METABOLIC PANEL
BUN: 14 mg/dL (ref 6–23)
CO2: 26 mEq/L (ref 19–32)
Calcium: 9.2 mg/dL (ref 8.4–10.5)
Chloride: 106 mEq/L (ref 96–112)
Creatinine, Ser: 0.6 mg/dL (ref 0.4–1.2)
GFR: 116.7 mL/min (ref >60.00)
Glucose, Bld: 82 mg/dL (ref 70–99)
Potassium: 4.1 mEq/L (ref 3.5–5.1)
Sodium: 139 mEq/L (ref 135–145)

## 2012-06-17 LAB — HEPATIC FUNCTION PANEL
ALT: 20 U/L (ref 0–35)
AST: 20 U/L (ref 0–37)
Albumin: 3.8 g/dL (ref 3.5–5.2)
Alkaline Phosphatase: 55 U/L (ref 39–117)
Bilirubin, Direct: 0 mg/dL (ref 0.0–0.3)
Total Bilirubin: 0.7 mg/dL (ref 0.3–1.2)
Total Protein: 7.1 g/dL (ref 6.0–8.3)

## 2012-06-17 LAB — LIPID PANEL
Cholesterol: 137 mg/dL (ref 0–200)
HDL: 55.6 mg/dL (ref >39.00)
LDL Cholesterol: 69 mg/dL (ref 0–99)
Total CHOL/HDL Ratio: 2
Triglycerides: 64 mg/dL (ref 0.0–149.0)
VLDL: 12.8 mg/dL (ref 0.0–40.0)

## 2012-06-19 ENCOUNTER — Telehealth: Payer: Self-pay | Admitting: *Deleted

## 2012-06-19 NOTE — Telephone Encounter (Signed)
Message copied by Awilda Bill on Thu Jun 19, 2012  4:14 PM ------      Message from: Rosalio Macadamia      Created: Tue Jun 17, 2012  4:00 PM       Ok to report. Labs are satisfactory.  Stay on same medicines. Recheck BMET/HPF/LIPIDS  in 6 months.

## 2012-06-19 NOTE — Telephone Encounter (Signed)
Patient notified of lab results.  Pt due to follow up with Dr. Shirlee Latch in December.  Labs due in 6 months.  HFP,Lipid, BMET

## 2012-08-07 ENCOUNTER — Other Ambulatory Visit: Payer: Self-pay | Admitting: Endocrinology

## 2012-08-07 DIAGNOSIS — Z1231 Encounter for screening mammogram for malignant neoplasm of breast: Secondary | ICD-10-CM

## 2012-09-03 ENCOUNTER — Ambulatory Visit
Admission: RE | Admit: 2012-09-03 | Discharge: 2012-09-03 | Disposition: A | Discharge status: Home / Self Care | Payer: BC Managed Care – PPO | Source: Ambulatory Visit | Attending: Endocrinology | Admitting: Endocrinology

## 2012-09-03 DIAGNOSIS — Z1231 Encounter for screening mammogram for malignant neoplasm of breast: Secondary | ICD-10-CM

## 2012-09-22 ENCOUNTER — Ambulatory Visit: Payer: BC Managed Care – PPO | Admitting: Cardiology

## 2012-10-06 ENCOUNTER — Encounter: Payer: Self-pay | Admitting: Cardiology

## 2012-10-09 ENCOUNTER — Encounter: Payer: Self-pay | Admitting: Cardiology

## 2012-10-09 ENCOUNTER — Ambulatory Visit (INDEPENDENT_AMBULATORY_CARE_PROVIDER_SITE_OTHER): Payer: BC Managed Care – PPO | Admitting: Cardiology

## 2012-10-09 VITALS — BP 124/80 | HR 80 | Ht 62.0 in | Wt 166.0 lb

## 2012-10-09 DIAGNOSIS — I2510 Atherosclerotic heart disease of native coronary artery without angina pectoris: Secondary | ICD-10-CM

## 2012-10-09 DIAGNOSIS — I429 Cardiomyopathy, unspecified: Secondary | ICD-10-CM

## 2012-10-09 DIAGNOSIS — I428 Other cardiomyopathies: Secondary | ICD-10-CM

## 2012-10-09 NOTE — Patient Instructions (Addendum)
Your physician wants you to follow-up in: 1 year with Dr McLean. (December 2014).You will receive a reminder letter in the mail two months in advance. If you don't receive a letter, please call our office to schedule the follow-up appointment.  

## 2012-10-09 NOTE — Progress Notes (Signed)
PCP: Dr. Renne Crigler  56 yo with history of HTN, intermittent LBBB, and mild nonischemic cardiomyopathy presents for cardiology followup.  She was admitted with chest pain in 6/13.  ECG showed intermittent LBBB.  Echo showed EF 45-50%.  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  Since that time, she has done well.  She is no longer smoking.  She walks for exercise with no exertional dyspnea.  No chest pain.    Labs (12/13): LDL 80, HDL 46, K 4, creatinine 0.7  PMH: 1. HTN 2. Hyperlipidemia 3. Prior smoker 4. Intermittent LBBB 5. Hypothyroidism 6. Cardiomyopathy: Echo (6/13) with EF 45-50%, paradoxical septal motion.  LHC (6/13) with 40% D1 stenosis, 30% PLV stenosis.    SH: Lives in [place], works in a Cytogeneticist and in a Science writer, quit smoking in 2013.   FH: No premature CAD.   Current Outpatient Prescriptions  Medication Sig Dispense Refill  . alendronate (FOSAMAX) 70 MG tablet Take 70 mg by mouth every 7 (seven) days. Take with a full glass of water on an empty stomach.      . ALPRAZolam (XANAX) 0.25 MG tablet Take 0.25 mg by mouth 2 (two) times daily.      Marland Kitchen amLODipine (NORVASC) 10 MG tablet Take 10 mg by mouth daily.        Marland Kitchen aspirin EC 81 MG EC tablet Take 1 tablet (81 mg total) by mouth daily.      Marland Kitchen atorvastatin (LIPITOR) 10 MG tablet Take 1 tablet (10 mg total) by mouth at bedtime.  30 tablet  6  . Cholecalciferol (VITAMIN D-3 PO) Take 2,000 Units by mouth daily.       Marland Kitchen HYDROcodone-acetaminophen (NORCO) 5-325 MG per tablet Take 1 tablet by mouth every 6 (six) hours as needed.      . Inositol Niacinate (NIACIN FLUSH FREE) 500 MG CAPS Take 3 capsules by mouth daily.      Marland Kitchen levothyroxine (SYNTHROID, LEVOTHROID) 112 MCG tablet Take 112 mcg by mouth daily.        Marland Kitchen losartan (COZAAR) 50 MG tablet Take 50 mg by mouth daily.      . Multiple Vitamins-Minerals (MULTIVITAMIN WITH MINERALS) tablet Take 1 tablet by mouth daily.      . nicotine (NICODERM CQ - DOSED IN MG/24 HOURS)  21 mg/24hr patch Place 1 patch onto the skin daily.        BP 124/80  Pulse 80  Ht 5\' 2"  (1.575 m)  Wt 166 lb (75.297 kg)  BMI 30.36 kg/m2 General: NAD Neck: No JVD, no thyromegaly or thyroid nodule.  Lungs: Clear to auscultation bilaterally with normal respiratory effort. CV: Nondisplaced PMI.  Heart regular S1/S2, no S3/S4, no murmur.  No peripheral edema.  No carotid bruit.  Normal pedal pulses.  Abdomen: Soft, nontender, no hepatosplenomegaly, no distention.  Neurologic: Alert and oriented x 3.  Psych: Normal affect. Extremities: No clubbing or cyanosis.   Assessment/Plan: 1. Cardiomyopathy: Suspect this may be a mild LBBB-related cardiomyopathy.  Nonobstructive CAD so not ischemic.  Continue losartan.  2. CAD: Nonobstructive.  Continue ASA 81 and statin.   [name] 10/12/2012

## 2012-10-12 DIAGNOSIS — I429 Cardiomyopathy, unspecified: Secondary | ICD-10-CM | POA: Insufficient documentation

## 2012-11-19 ENCOUNTER — Other Ambulatory Visit: Payer: Self-pay | Admitting: Physician Assistant

## 2013-04-09 ENCOUNTER — Encounter: Payer: Self-pay | Admitting: Cardiology

## 2013-04-16 ENCOUNTER — Other Ambulatory Visit (HOSPITAL_COMMUNITY)
Admission: RE | Admit: 2013-04-16 | Payer: BC Managed Care – PPO | Source: Ambulatory Visit | Admitting: Internal Medicine

## 2013-04-16 ENCOUNTER — Inpatient Hospital Stay (HOSPITAL_COMMUNITY): Admit: 2013-04-16 | Payer: Self-pay

## 2013-04-17 ENCOUNTER — Other Ambulatory Visit: Payer: Self-pay

## 2013-06-05 ENCOUNTER — Other Ambulatory Visit: Payer: Self-pay | Admitting: Cardiology

## 2013-07-28 ENCOUNTER — Other Ambulatory Visit: Payer: Self-pay

## 2013-07-28 DIAGNOSIS — Z1231 Encounter for screening mammogram for malignant neoplasm of breast: Secondary | ICD-10-CM

## 2013-09-04 ENCOUNTER — Ambulatory Visit
Admission: RE | Admit: 2013-09-04 | Discharge: 2013-09-04 | Disposition: A | Discharge status: Home / Self Care | Payer: Self-pay | Source: Ambulatory Visit

## 2013-09-04 DIAGNOSIS — Z1231 Encounter for screening mammogram for malignant neoplasm of breast: Secondary | ICD-10-CM

## 2013-10-15 ENCOUNTER — Ambulatory Visit (INDEPENDENT_AMBULATORY_CARE_PROVIDER_SITE_OTHER): Payer: PRIVATE HEALTH INSURANCE | Admitting: Cardiology

## 2013-10-15 ENCOUNTER — Encounter: Payer: Self-pay | Admitting: Cardiology

## 2013-10-15 VITALS — BP 124/78 | HR 71 | Ht 62.0 in | Wt 169.0 lb

## 2013-10-15 DIAGNOSIS — I2510 Atherosclerotic heart disease of native coronary artery without angina pectoris: Secondary | ICD-10-CM

## 2013-10-15 DIAGNOSIS — I428 Other cardiomyopathies: Secondary | ICD-10-CM

## 2013-10-15 DIAGNOSIS — F17200 Nicotine dependence, unspecified, uncomplicated: Secondary | ICD-10-CM

## 2013-10-15 DIAGNOSIS — I429 Cardiomyopathy, unspecified: Secondary | ICD-10-CM

## 2013-10-15 DIAGNOSIS — Z720 Tobacco use: Secondary | ICD-10-CM

## 2013-10-15 NOTE — Patient Instructions (Signed)
Your physician wants you to follow-up in: 1 year with Dr McLean. (December 2015). You will receive a reminder letter in the mail two months in advance. If you don't receive a letter, please call our office to schedule the follow-up appointment.  

## 2013-10-17 NOTE — Progress Notes (Signed)
Patient ID: Stacy Hernandez, female   DOB: [birth year], 57 y.o.   MRN: [id number] PCP: Dr. Renne Crigler  57 yo with history of HTN, intermittent LBBB, and mild nonischemic cardiomyopathy presents for cardiology followup.  She was admitted with chest pain in 6/13.  ECG showed intermittent LBBB.  Echo showed EF 45-50%.  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  Since that time, she has done well.  She walks for exercise with no exertional dyspnea.  No chest pain.  She continues to work 2 jobs.  She still smokes 1 ppd.   Labs (12/13): LDL 80, HDL 46, K 4, creatinine 0.7 Labs (6/14): K 4, creatinine 0.8, LDL 73, HDL 45  ECG: NSR, IVCD  PMH: 1. HTN 2. Hyperlipidemia 3. Prior smoker 4. Intermittent LBBB/IVCD 5. Hypothyroidism 6. Cardiomyopathy: Echo (6/13) with EF 45-50%, paradoxical septal motion.  LHC (6/13) with 40% D1 stenosis, 30% PLV stenosis.    SH: Lives in [place], works in a Cytogeneticist and in a Science writer, quit smoking in 2013.   FH: No premature CAD.   Current Outpatient Prescriptions  Medication Sig Dispense Refill  . ALPRAZolam (XANAX) 0.25 MG tablet Take 0.25 mg by mouth 2 (two) times daily.      Marland Kitchen amLODipine (NORVASC) 10 MG tablet Take 10 mg by mouth daily.        Marland Kitchen aspirin 81 MG tablet Take 81 mg by mouth daily.      Marland Kitchen atorvastatin (LIPITOR) 10 MG tablet TAKE 1 TABLET BY MOUTH AT BEDTIME  30 tablet  6  . Cholecalciferol (VITAMIN D-3 PO) Take 2,000 Units by mouth daily.       Marland Kitchen HYDROcodone-acetaminophen (NORCO) 5-325 MG per tablet Take 1 tablet by mouth every 6 (six) hours as needed.      . Inositol Niacinate (NIACIN FLUSH FREE) 500 MG CAPS Take 3 capsules by mouth daily.      Marland Kitchen levothyroxine (SYNTHROID, LEVOTHROID) 100 MCG tablet Take 100 mcg by mouth daily before breakfast.      . losartan (COZAAR) 50 MG tablet Take 50 mg by mouth daily.      . Multiple Vitamins-Minerals (MULTIVITAMIN WITH MINERALS) tablet Take 1 tablet by mouth daily.       No current  facility-administered medications for this visit.    BP 124/78  Pulse 71  Ht 5\' 2"  (1.575 m)  Wt 76.658 kg (169 lb)  BMI 30.90 kg/m2 General: NAD Neck: No JVD, no thyromegaly or thyroid nodule.  Lungs: Clear to auscultation bilaterally with normal respiratory effort. CV: Nondisplaced PMI.  Heart regular S1/S2, no S3/S4, 1/6 SEM.  No peripheral edema.  No carotid bruit.  Normal pedal pulses.  Abdomen: Soft, nontender, no hepatosplenomegaly, no distention.  Neurologic: Alert and oriented x 3.  Psych: Normal affect. Extremities: No clubbing or cyanosis.   Assessment/Plan: 1. Cardiomyopathy: Suspect this may be a mild LBBB/IVCD-related cardiomyopathy.  Nonobstructive CAD so not ischemic.  Continue losartan.  2. CAD: Nonobstructive.  Continue ASA 81 and statin. Will get a copy of lipids from PCP. 3. Smoking: I strongly encouraged her to quit.  She has had a hard time stopping, Wellbutrin and e-cigarettes have not worked.   Followup in 1 year.   [name] 10/17/2013

## 2013-11-05 HISTORY — PX: COLONOSCOPY: SHX174

## 2013-12-28 ENCOUNTER — Other Ambulatory Visit: Payer: Self-pay | Admitting: Cardiology

## 2014-02-11 ENCOUNTER — Encounter: Payer: Self-pay | Admitting: Internal Medicine

## 2014-04-16 ENCOUNTER — Encounter: Payer: Self-pay | Admitting: Cardiology

## 2014-04-26 ENCOUNTER — Encounter: Payer: Self-pay | Admitting: Internal Medicine

## 2014-06-21 ENCOUNTER — Ambulatory Visit (AMBULATORY_SURGERY_CENTER): Payer: Self-pay | Admitting: *Deleted

## 2014-06-21 VITALS — Ht 62.0 in | Wt 171.0 lb

## 2014-06-21 DIAGNOSIS — Z86010 Personal history of colonic polyps: Secondary | ICD-10-CM

## 2014-06-21 MED ORDER — MOVIPREP 100 G PO SOLR: ORAL | Status: DC

## 2014-06-21 NOTE — Progress Notes (Signed)
Patient denies any allergies to eggs or soy. Patient denies any problems with anesthesia/sedation. Patient denies any oxygen use at home and does not take any diet/weight loss medications. EMMI education assisgned to patient on colonoscopy, this was explained and instructions given to patient. 

## 2014-07-05 ENCOUNTER — Encounter: Payer: Self-pay | Admitting: Internal Medicine

## 2014-07-05 ENCOUNTER — Ambulatory Visit (AMBULATORY_SURGERY_CENTER): Payer: PRIVATE HEALTH INSURANCE | Admitting: Internal Medicine

## 2014-07-05 VITALS — BP 142/63 | HR 61 | Temp 97.4°F | Resp 15 | Ht 62.0 in | Wt 171.0 lb

## 2014-07-05 DIAGNOSIS — Z86010 Personal history of colonic polyps: Secondary | ICD-10-CM

## 2014-07-05 MED ORDER — SODIUM CHLORIDE 0.9 % IV SOLN: 500.0000 mL | INTRAVENOUS | Status: DC

## 2014-07-05 NOTE — Patient Instructions (Signed)
YOU HAD AN ENDOSCOPIC PROCEDURE TODAY AT THE [facility]: Refer to the procedure report that was given to you for any specific questions about what was found during the examination.  If the procedure report does not answer your questions, please call your gastroenterologist to clarify.  If you requested that your care partner not be given the details of your procedure findings, then the procedure report has been included in a sealed envelope for you to review at your convenience later.  YOU SHOULD EXPECT: Some feelings of bloating in the abdomen. Passage of more gas than usual.  Walking can help get rid of the air that was put into your GI tract during the procedure and reduce the bloating. If you had a lower endoscopy (such as a colonoscopy or flexible sigmoidoscopy) you may notice spotting of blood in your stool or on the toilet paper. If you underwent a bowel prep for your procedure, then you may not have a normal bowel movement for a few days.  DIET: Your first meal following the procedure should be a light meal and then it is ok to progress to your normal diet.  A half-sandwich or bowl of soup is an example of a good first meal.  Heavy or fried foods are harder to digest and may make you feel nauseous or bloated.  Likewise meals heavy in dairy and vegetables can cause extra gas to form and this can also increase the bloating.  Drink plenty of fluids but you should avoid alcoholic beverages for 24 hours.  ACTIVITY: Your care partner should take you home directly after the procedure.  You should plan to take it easy, moving slowly for the rest of the day.  You can resume normal activity the day after the procedure however you should NOT DRIVE or use heavy machinery for 24 hours (because of the sedation medicines used during the test).    SYMPTOMS TO REPORT IMMEDIATELY: A gastroenterologist can be reached at any hour.  During normal business hours, 8:30 AM to 5:00 PM Monday through Friday,  call [phone].  After hours and on weekends, please call the GI answering service at [phone] who will take a message and have the physician on call contact you.   Following lower endoscopy (colonoscopy or flexible sigmoidoscopy):  Excessive amounts of blood in the stool  Significant tenderness or worsening of abdominal pains  Swelling of the abdomen that is new, acute  Fever of 100F or higher  FOLLOW UP: If any biopsies were taken you will be contacted by phone or by letter within the next 1-3 weeks.  Call your gastroenterologist if you have not heard about the biopsies in 3 weeks.  Our staff will call the home number listed on your records the next business day following your procedure to check on you and address any questions or concerns that you may have at that time regarding the information given to you following your procedure. This is a courtesy call and so if there is no answer at the home number and we have not heard from you through the emergency physician on call, we will assume that you have returned to your regular daily activities without incident.  SIGNATURES/CONFIDENTIALITY: You and/or your care partner have signed paperwork which will be entered into your electronic medical record.  These signatures attest to the fact that that the information above on your After Visit Summary has been reviewed and is understood.  Full responsibility of the confidentiality of this   discharge information lies with you and/or your care-partner.  Diverticulosis Diverticulosis is the condition that develops when small pouches (diverticula) form in the wall of your colon. Your colon, or large intestine, is where water is absorbed and stool is formed. The pouches form when the inside layer of your colon pushes through weak spots in the outer layers of your colon. CAUSES  No one knows exactly what causes diverticulosis. RISK FACTORS  Being older than 4. Your risk for this condition  increases with age. Diverticulosis is rare in people younger than 40 years. By age 39, almost everyone has it.  Eating a low-fiber diet.  Being frequently constipated.  Being overweight.  Not getting enough exercise.  Smoking.  Taking over-the-counter pain medicines, like aspirin and ibuprofen. SYMPTOMS  Most people with diverticulosis do not have symptoms. DIAGNOSIS  Because diverticulosis often has no symptoms, health care providers often discover the condition during an exam for other colon problems. In many cases, a health care provider will diagnose diverticulosis while using a flexible scope to examine the colon (colonoscopy). TREATMENT  If you have never developed an infection related to diverticulosis, you may not need treatment. If you have had an infection before, treatment may include:  Eating more fruits, vegetables, and grains.  Taking a fiber supplement.  Taking a live bacteria supplement (probiotic).  Taking medicine to relax your colon. HOME CARE INSTRUCTIONS   Drink at least 6-8 glasses of water each day to prevent constipation.  Try not to strain when you have a bowel movement.  Keep all follow-up appointments. If you have had an infection before:  Increase the fiber in your diet as directed by your health care provider or dietitian.  Take a dietary fiber supplement if your health care provider approves.  Only take medicines as directed by your health care provider. SEEK MEDICAL CARE IF:   You have abdominal pain.  You have bloating.  You have cramps.  You have not gone to the bathroom in 3 days. SEEK IMMEDIATE MEDICAL CARE IF:   Your pain gets worse.  Yourbloating becomes very bad.  You have a fever or chills, and your symptoms suddenly get worse.  You begin vomiting.  You have bowel movements that are bloody or black. MAKE SURE YOU:  Understand these instructions.  Will watch your condition.  Will get help right away if you are  not doing well or get worse. Document Released: 07/19/2004 Document Revised: 10/27/2013 Document Reviewed: 09/16/2013 [facility] Patient Information 2015 [place], [place]. This information is not intended to replace advice given to you by your health care provider. Make sure you discuss any questions you have with your health care provider.

## 2014-07-05 NOTE — Progress Notes (Signed)
Procedure ends, to recovery awake, report given and VSS.

## 2014-07-05 NOTE — Op Note (Signed)
[facility]  [address] [place], [address]   COLONOSCOPY PROCEDURE REPORT  PATIENT: Stacy, Hernandez  MR#: [id number] BIRTHDATE: 12-18-1955 , 57  yrs. old GENDER: Female ENDOSCOPIST: Eustace Quail, MD REFERRED QI:[id number] Program Recall PROCEDURE DATE:  07/05/2014 PROCEDURE:   Colonoscopy, surveillance First Screening Colonoscopy - Avg.  risk and is 50 yrs.  old or older - No.  Prior Negative Screening - Now for repeat screening. N/A  History of Adenoma - Now for follow-up colonoscopy & has been > or = to 3 yrs.  Yes hx of adenoma.  Has been 3 or more years since last colonoscopy.  Polyps Removed Today? No.  Recommend repeat exam, <10 yrs? No. ASA CLASS:   Class II INDICATIONS:Patient's personal history of adenomatous colon polypsIndex 2005 (small TA); 2010 (small TA, SSP). MEDICATIONS: MAC sedation, administered by CRNA and propofol (Diprivan) 260mg  IV  DESCRIPTION OF PROCEDURE:   After the risks benefits and alternatives of the procedure were thoroughly explained, informed consent was obtained.  A digital rectal exam revealed no abnormalities of the rectum.   The LB [id number]  endoscope was introduced through the anus and advanced to the cecum, which was identified by both the appendix and ileocecal valve. No adverse events experienced.   The quality of the prep was excellent, using MoviPrep  The instrument was then slowly withdrawn as the colon was fully examined.  COLON FINDINGS: Mild diverticulosis was noted in the sigmoid colon. The colon was otherwise normal.  There was no  inflammation, polyps or cancers unless previously stated.  Retroflexed views revealed no abnormalities. The time to cecum=1 minutes 42 seconds. Withdrawal time=8 minutes 46 seconds.  The scope was withdrawn and the procedure completed. COMPLICATIONS: There were no complications.  ENDOSCOPIC IMPRESSION: 1.   Mild diverticulosis was noted in the sigmoid colon 2.    The colon was otherwise normal  RECOMMENDATIONS: 1. Continue current colorectal surveillance recommendations with a repeat colonoscopy in 10 years.   eSigned:  Eustace Quail, MD 07/05/2014 9:27 AM   cc: Alden Server, MD and The Patient

## 2014-07-06 ENCOUNTER — Telehealth: Payer: Self-pay | Admitting: *Deleted

## 2014-07-06 NOTE — Telephone Encounter (Signed)
  Follow up Call-  Call back number 07/05/2014  Post procedure Call Back phone  # [phone]  Permission to leave phone message Yes     Patient questions:  Do you have a fever, pain , or abdominal swelling? No. Pain Score  0 *  Have you tolerated food without any problems? Yes.    Have you been able to return to your normal activities? Yes.    Do you have any questions about your discharge instructions: Diet   No. Medications  No. Follow up visit  No.  Do you have questions or concerns about your Care? No.  Actions: * If pain score is 4 or above: No action needed, pain <4.

## 2014-08-17 ENCOUNTER — Other Ambulatory Visit: Payer: Self-pay

## 2014-08-17 DIAGNOSIS — Z1239 Encounter for other screening for malignant neoplasm of breast: Secondary | ICD-10-CM

## 2014-08-20 ENCOUNTER — Other Ambulatory Visit: Payer: Self-pay | Admitting: Cardiology

## 2014-08-27 ENCOUNTER — Other Ambulatory Visit: Payer: Self-pay

## 2014-08-27 DIAGNOSIS — Z1231 Encounter for screening mammogram for malignant neoplasm of breast: Secondary | ICD-10-CM

## 2014-09-06 ENCOUNTER — Ambulatory Visit
Admission: RE | Admit: 2014-09-06 | Discharge: 2014-09-06 | Disposition: A | Discharge status: Home / Self Care | Payer: 59 | Source: Ambulatory Visit

## 2014-09-06 DIAGNOSIS — Z1231 Encounter for screening mammogram for malignant neoplasm of breast: Secondary | ICD-10-CM

## 2014-10-14 ENCOUNTER — Encounter (HOSPITAL_COMMUNITY): Payer: Self-pay | Admitting: Cardiology

## 2014-10-18 ENCOUNTER — Encounter: Payer: Self-pay | Admitting: Physician Assistant

## 2014-10-18 ENCOUNTER — Ambulatory Visit (INDEPENDENT_AMBULATORY_CARE_PROVIDER_SITE_OTHER): Payer: 59 | Admitting: Physician Assistant

## 2014-10-18 VITALS — BP 122/80 | HR 74 | Ht 62.0 in | Wt 168.8 lb

## 2014-10-18 DIAGNOSIS — Z720 Tobacco use: Secondary | ICD-10-CM

## 2014-10-18 DIAGNOSIS — R5383 Other fatigue: Secondary | ICD-10-CM

## 2014-10-18 DIAGNOSIS — E785 Hyperlipidemia, unspecified: Secondary | ICD-10-CM

## 2014-10-18 DIAGNOSIS — I429 Cardiomyopathy, unspecified: Secondary | ICD-10-CM

## 2014-10-18 DIAGNOSIS — R079 Chest pain, unspecified: Secondary | ICD-10-CM

## 2014-10-18 DIAGNOSIS — I10 Essential (primary) hypertension: Secondary | ICD-10-CM

## 2014-10-18 DIAGNOSIS — I25119 Atherosclerotic heart disease of native coronary artery with unspecified angina pectoris: Secondary | ICD-10-CM

## 2014-10-18 MED ORDER — NITROGLYCERIN 0.4 MG SL SUBL: 0.4000 mg | SUBLINGUAL_TABLET | SUBLINGUAL | Status: DC | PRN

## 2014-10-18 NOTE — Progress Notes (Signed)
Cardiology Office Note   Date:  10/18/2014   ID:  Stacy Hernandez, DOB 1955/12/04, MRN [id number]  PCP:  Baldemar Friday, FNP  Cardiologist:  Dr. Loralie Champagne      History of Present Illness: Stacy Hernandez is a 58 y.o. female with a history of HTN, intermittent LBBB and mild NICM.  She was admitted with chest pain in 6/13. ECG showed intermittent LBBB. Echo showed EF 45-50%. 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 Last seen by Dr. Aundra Dubin 10/2013.  She returns for follow-up.  Over the last 2 mos, she has developed substernal chest pressure with exertion.  This seems to have gradually worsened. She denies associated arm, jaw pain, nausea or diaphoresis.  She denies rest symptoms.  She does note dyspnea with exertion. She is NYHA 2b. She denies orthopnea, PND, edema.  She denies syncope.  She wheezes at nighttime.  She takes Albuterol for this with relief.  She continues to smoke 2 ppd.    Studies:   - LHC (6/13):  LAD with mild plaquing, major diagonal 40% just after the bifurcation, proximal and mid RCA 30%, EF 55-65%  - Echo (6/13):  Mild LVH, EF 45-50%, normal wall motion, ventricular septal paradox, mild BAE    Recent Labs: No results found for requested labs within last 365 days.  04/2014: K+ 4.4, creatinine 0.59, ALT 15  Recent Radiology: No results found.    Wt Readings from Last 3 Encounters:  07/05/14 171 lb (77.565 kg)  06/21/14 171 lb (77.565 kg)  10/15/13 169 lb (76.658 kg)     Past Medical History: 1. HTN 2. Hyperlipidemia 3. Prior smoker 4. Intermittent LBBB/IVCD 5. Hypothyroidism 6. Cardiomyopathy: Echo (6/13) with EF 45-50%, paradoxical septal motion. LHC (6/13) with 40% D1 stenosis, 30% PLV stenosis.  Past Medical History  Diagnosis Date  . Hypertension   . Thyroid disease     Post-ablation hypothyroidism  . Hypertriglyceridemia   . Hypercalcemia     Due to HCTZ, improved with d/c of calcium supplements  . Nephrolithiasis   .  Osteoporosis   . Tobacco abuse   . Back pain     Recent positive ANA but told she has OA  . LV dysfunction     EF 45-50% by echo 04/23/2012  . CAD (coronary artery disease)     per cath in June 2013 with nonobstructive CAD    Current Outpatient Prescriptions  Medication Sig Dispense Refill  . ALPRAZolam (XANAX) 0.25 MG tablet Take 0.25 mg by mouth 2 (two) times daily.    Marland Kitchen amLODipine (NORVASC) 10 MG tablet Take 10 mg by mouth daily.      Marland Kitchen aspirin 81 MG tablet Take 81 mg by mouth daily.    Marland Kitchen atorvastatin (LIPITOR) 10 MG tablet TAKE 1 TABLET BY MOUTH AT BEDTIME 30 tablet 6  . Cholecalciferol (VITAMIN D-3 PO) Take 2,000 Units by mouth daily.     Marland Kitchen HYDROcodone-acetaminophen (NORCO) 5-325 MG per tablet Take 1 tablet by mouth every 6 (six) hours as needed.    . Inositol Niacinate (NIACIN FLUSH FREE) 500 MG CAPS Take 3 capsules by mouth daily.    Marland Kitchen levothyroxine (SYNTHROID, LEVOTHROID) 100 MCG tablet Take 100 mcg by mouth daily before breakfast.    . losartan (COZAAR) 50 MG tablet Take 50 mg by mouth daily.    . Multiple Vitamins-Minerals (MULTIVITAMIN WITH MINERALS) tablet Take 1 tablet by mouth daily.     No current facility-administered medications for this visit.  Allergies:   Review of patient's allergies indicates no known allergies.   Social History:  The patient  reports that she has been smoking Cigarettes.  She has a 50 pack-year smoking history. She has never used smokeless tobacco. She reports that she does not drink alcohol or use illicit drugs.   Family History:  The patient's family history includes COPD in her mother; Cirrhosis in her sister; Colon cancer (age of onset: 7) in her maternal uncle; Colon polyps in her mother; Coronary artery disease in her father; Diabetes in her sister; Hypertension in her father.    ROS:  Please see the history of present illness.  She notes symptoms of fatigue. She denies melena hematochezia.   All other systems reviewed and negative.      PHYSICAL EXAM: VS:  BP 122/80 mmHg  Pulse 74  Ht 5\' 2"  (1.575 m)  Wt 168 lb 12.8 oz (76.567 kg)  BMI 30.87 kg/m2 Well nourished, well developed, in no acute distress HEENT: normal Neck: no JVD Cardiac:  normal S1, S2;  RRR; no murmur   Lungs:   clear to auscultation bilaterally, no wheezing, rhonchi or rales Abd: soft, nontender, no hepatomegaly Ext:  no edema Skin: warm and dry Neuro:  CNs 2-12 intact, no focal abnormalities noted  EKG:  NSR, HR 74, IVCD      ASSESSMENT AND PLAN:  1.  Chest Pain: She has symptoms with worrisome features. Symptoms may be explained by progressive CAD or possibly COPD. Cardiac catheterization in 2013 demonstrated mild nonobstructive CAD.    -  Arrange Lexiscan Myoview.    -  Arrange 2-D echocardiogram given her dyspnea and prior cardiomyopathy. This will allow to assess her right heart pressures as well given her long smoking history.    -  Obtain CXR.    -  Check BMET, CBC.  2.  Nonischemic Cardiomyopathy: It has been suspected that this may be a mild LBBB/IVCD related cardiomyopathy.  Repeat echocardiogram as outlined above to reassess LVF.  Continue ARB.  I suspect beta blocker has been avoided due to conduction system disease.  2.  Nonobstructive Coronary Artery Disease:  As noted, she has had increasing amounts of chest pain.    -  Continue ASA, statin.    -  Add NTG prn.    -  Obtain Myoview as noted.  3.  Tobacco Abuse:  We discussed the need to quit.    -  Obtain CXR.  4.  Hypertension:  Controlled.  5.  Hyperlipidemia:  Continue statin.   Disposition:   FU with me or Dr. Loralie Champagne in 2 weeks.    Signed, Versie Starks, MHS 10/18/2014 3:49 PM    [facility] [address], [place], [place]  [address] Phone: [phone]; Fax: [phone]

## 2014-10-18 NOTE — Patient Instructions (Signed)
Your physician has requested that you have a lexiscan myoview. For further information please visit [email or web address]. Please follow instruction sheet, as given.  Your physician has requested that you have an echocardiogram. Echocardiography is a painless test that uses sound waves to create images of your heart. It provides your doctor with information about the size and shape of your heart and how well your heart's chambers and valves are working. This procedure takes approximately one hour. There are no restrictions for this procedure.  A chest x-ray takes a picture of the organs and structures inside the chest, including the heart, lungs, and blood vessels. This test can show several things, including, whether the heart is enlarges; whether fluid is building up in the lungs; and whether pacemaker / defibrillator leads are still in place.  LAB WORK TODAY; BMET, CBC W/DIFF  AN RX FOR NITROGLYCERIN HAS BEEN SENT IN TODAY AND YOU HAVE BEEN ADVISED AS TO HOW AND WHEN TO USE NTG  Your physician recommends that you schedule a follow-up appointment in: [place], PAC SAME DAY DR. Aundra Dubin

## 2014-10-19 ENCOUNTER — Telehealth: Payer: Self-pay | Admitting: *Deleted

## 2014-10-19 ENCOUNTER — Ambulatory Visit
Admission: RE | Admit: 2014-10-19 | Discharge: 2014-10-19 | Disposition: A | Discharge status: Home / Self Care | Payer: 59 | Source: Ambulatory Visit | Attending: Physician Assistant | Admitting: Physician Assistant

## 2014-10-19 DIAGNOSIS — Z720 Tobacco use: Secondary | ICD-10-CM

## 2014-10-19 DIAGNOSIS — R079 Chest pain, unspecified: Secondary | ICD-10-CM

## 2014-10-19 LAB — CBC WITH DIFFERENTIAL/PLATELET
Basophils Absolute: 0.1 10*3/uL (ref 0.0–0.1)
Basophils Relative: 0.9 % (ref 0.0–3.0)
Eosinophils Absolute: 0.2 10*3/uL (ref 0.0–0.7)
Eosinophils Relative: 2.7 % (ref 0.0–5.0)
HCT: 43.3 % (ref 36.0–46.0)
Hemoglobin: 14.2 g/dL (ref 12.0–15.0)
Lymphocytes Relative: 24.1 % (ref 12.0–46.0)
Lymphs Abs: 2.1 10*3/uL (ref 0.7–4.0)
MCHC: 32.7 g/dL (ref 30.0–36.0)
MCV: 92.5 fl (ref 78.0–100.0)
Monocytes Absolute: 0.6 10*3/uL (ref 0.1–1.0)
Monocytes Relative: 7.1 % (ref 3.0–12.0)
Neutro Abs: 5.6 10*3/uL (ref 1.4–7.7)
Neutrophils Relative %: 65.2 % (ref 43.0–77.0)
Platelets: 208 10*3/uL (ref 150.0–400.0)
RBC: 4.68 Mil/uL (ref 3.87–5.11)
RDW: 14 % (ref 11.5–15.5)
WBC: 8.6 10*3/uL (ref 4.0–10.5)

## 2014-10-19 LAB — BASIC METABOLIC PANEL
BUN: 18 mg/dL (ref 6–23)
CO2: 25 mEq/L (ref 19–32)
Calcium: 10.6 mg/dL — ABNORMAL HIGH (ref 8.4–10.5)
Chloride: 108 mEq/L (ref 96–112)
Creatinine, Ser: 0.7 mg/dL (ref 0.4–1.2)
GFR: 88.38 mL/min (ref >60.00)
Glucose, Bld: 91 mg/dL (ref 70–99)
Potassium: 4.2 mEq/L (ref 3.5–5.1)
Sodium: 139 mEq/L (ref 135–145)

## 2014-10-19 NOTE — Telephone Encounter (Signed)
pt notified of cxr and lab results with verbal understanding to results given.

## 2014-11-02 ENCOUNTER — Ambulatory Visit (HOSPITAL_BASED_OUTPATIENT_CLINIC_OR_DEPARTMENT_OTHER): Payer: 59 | Admitting: Radiology

## 2014-11-02 ENCOUNTER — Ambulatory Visit (HOSPITAL_COMMUNITY): Payer: 59 | Attending: Cardiovascular Disease | Admitting: Radiology

## 2014-11-02 DIAGNOSIS — I25119 Atherosclerotic heart disease of native coronary artery with unspecified angina pectoris: Secondary | ICD-10-CM

## 2014-11-02 DIAGNOSIS — R0609 Other forms of dyspnea: Secondary | ICD-10-CM | POA: Insufficient documentation

## 2014-11-02 DIAGNOSIS — R0789 Other chest pain: Secondary | ICD-10-CM | POA: Diagnosis not present

## 2014-11-02 DIAGNOSIS — I429 Cardiomyopathy, unspecified: Secondary | ICD-10-CM

## 2014-11-02 DIAGNOSIS — E785 Hyperlipidemia, unspecified: Secondary | ICD-10-CM | POA: Insufficient documentation

## 2014-11-02 DIAGNOSIS — I10 Essential (primary) hypertension: Secondary | ICD-10-CM | POA: Insufficient documentation

## 2014-11-02 DIAGNOSIS — R079 Chest pain, unspecified: Secondary | ICD-10-CM

## 2014-11-02 MED ORDER — TECHNETIUM TC 99M SESTAMIBI GENERIC - CARDIOLITE
30.0000 | Freq: Once | INTRAVENOUS | Status: AC | PRN
  Administered 2014-11-02: 30 via INTRAVENOUS

## 2014-11-02 MED ORDER — TECHNETIUM TC 99M SESTAMIBI GENERIC - CARDIOLITE
10.0000 | Freq: Once | INTRAVENOUS | Status: AC | PRN
  Administered 2014-11-02: 10 via INTRAVENOUS

## 2014-11-02 MED ORDER — REGADENOSON 0.4 MG/5ML IV SOLN
0.4000 mg | Freq: Once | INTRAVENOUS | Status: AC
  Administered 2014-11-02: 0.4 mg via INTRAVENOUS

## 2014-11-02 NOTE — Progress Notes (Signed)
Echocardiogram performed.  

## 2014-11-02 NOTE — Progress Notes (Signed)
[facility] [address] [place], [place] [address] [phone]    Cardiology Nuclear Med Study  Stacy Hernandez is a 58 y.o. female     MRN : [id number]     DOB: January 18, 1956  Procedure Date: 11/02/2014  Nuclear Med Background Indication for Stress Test:  Evaluation for Ischemia and Follow up CAD History:  N/O CAD Cardiac Risk Factors: Hypertension and Lipids  Symptoms:  Chest Pressure and DOE   Nuclear Pre-Procedure Caffeine/Decaff Intake:  8:00pm NPO After: 9:00pm   Lungs:  clear O2 Sat: 94% on room air. IV 0.9% NS with Angio Cath:  22g  IV Site: R Hand  IV Started by:  Matilde Haymaker, RN  Chest Size (in):  38 Cup Size: C  Height: 5\' 2"  (1.575 m)  Weight:  166 lb (75.297 kg)  BMI:  Body mass index is 30.35 kg/(m^2). Tech Comments:  n/a    Nuclear Med Study 1 or 2 day study: 1 day  Stress Test Type:  Treadmill/Lexiscan  Reading MD: n/a  Order Authorizing Provider:  Chelsea Primus and Rowan Blase  Resting Radionuclide: Technetium 66m Sestamibi  Resting Radionuclide Dose: 11.0 mCi   Stress Radionuclide:  Technetium 75m Sestamibi  Stress Radionuclide Dose: 33.0 mCi           Stress Protocol Rest HR: 77 Stress HR: 100  Rest BP: 143/87 Stress BP: 125/85  Exercise Time (min): n/a METS: n/a   Predicted Max HR: 162 bpm % Max HR: 61.73 bpm Rate Pressure Product: 13700   Dose of Adenosine (mg):  n/a Dose of Lexiscan: 0.4 mg  Dose of Atropine (mg): n/a Dose of Dobutamine: n/a mcg/kg/min (at max HR)  Stress Test Technologist: Glade Lloyd, BS-ES  Nuclear Technologist:  Vedia Pereyra, CNMT     Rest Procedure:  Myocardial perfusion imaging was performed at rest 45 minutes following the intravenous administration of Technetium 80m Sestamibi. Rest ECG: NSR-LBBB  Stress Procedure:  The patient received IV Lexiscan 0.4 mg over 15-seconds with concurrent low level exercise and then Technetium 35m Sestamibi was injected at 30-seconds  while the patient continued walking one more minute.  Quantitative spect images were obtained after a 45-minute delay.  During the infusion of Lexiscan the patient complained of SOB and a weak/heavy feeling.  These symptoms began to resolve in recovery.  Stress ECG: No significant change from baseline ECG  QPS Raw Data Images:  Normal; no motion artifact; normal heart/lung ratio. Stress Images:  Normal homogeneous uptake in all areas of the myocardium. Rest Images:  Normal homogeneous uptake in all areas of the myocardium. Subtraction (SDS):  No evidence of ischemia. Transient Ischemic Dilatation (Normal <1.22):  1.03 Lung/Heart Ratio (Normal <0.45):  0.33  Quantitative Gated Spect Images QGS EDV:  216 ml QGS ESV:  161 ml  Impression Exercise Capacity:  Lexiscan with low level exercise. BP Response:  Normal blood pressure response. Clinical Symptoms:  No chest pain. ECG Impression:  Baseline:  LBBB.  EKG uninterpretable due to LBBB at rest and stress. Comparison with Prior Nuclear Study: No previous nuclear study performed  Overall Impression:  Intermediate risk stress nuclear study.  There is no evidence of ischemia. There is diminished isotope uptake distal anteroseptal and apical areas on stress and rest images which may be secondary to the IVCD of LBBB type, or may be secondary to scar.  There is severe LV cavity dilatation with severe LV systolic dysfunction with EF 25% and global hypokinesis.  LV Ejection Fraction: 25%.  LV Wall Motion:  Severe LV systolic dysfunction.  Global hypokinesis.  Darlin Coco MD

## 2014-11-03 ENCOUNTER — Encounter: Payer: Self-pay | Admitting: Physician Assistant

## 2014-11-04 ENCOUNTER — Encounter (HOSPITAL_COMMUNITY): Payer: Self-pay | Admitting: Cardiology

## 2014-11-04 ENCOUNTER — Ambulatory Visit (HOSPITAL_COMMUNITY)
Admission: RE | Admit: 2014-11-04 | Discharge: 2014-11-04 | Disposition: A | Discharge status: Home / Self Care | Payer: 59 | Source: Ambulatory Visit | Attending: Cardiology | Admitting: Cardiology

## 2014-11-04 ENCOUNTER — Encounter (HOSPITAL_COMMUNITY): Payer: Self-pay

## 2014-11-04 VITALS — BP 112/72 | HR 77 | Wt 170.4 lb

## 2014-11-04 DIAGNOSIS — I255 Ischemic cardiomyopathy: Secondary | ICD-10-CM | POA: Diagnosis not present

## 2014-11-04 DIAGNOSIS — Z79899 Other long term (current) drug therapy: Secondary | ICD-10-CM | POA: Insufficient documentation

## 2014-11-04 DIAGNOSIS — E785 Hyperlipidemia, unspecified: Secondary | ICD-10-CM | POA: Diagnosis not present

## 2014-11-04 DIAGNOSIS — I10 Essential (primary) hypertension: Secondary | ICD-10-CM | POA: Diagnosis not present

## 2014-11-04 DIAGNOSIS — I208 Other forms of angina pectoris: Secondary | ICD-10-CM

## 2014-11-04 DIAGNOSIS — E039 Hypothyroidism, unspecified: Secondary | ICD-10-CM | POA: Diagnosis not present

## 2014-11-04 DIAGNOSIS — I252 Old myocardial infarction: Secondary | ICD-10-CM | POA: Diagnosis not present

## 2014-11-04 DIAGNOSIS — I447 Left bundle-branch block, unspecified: Secondary | ICD-10-CM | POA: Insufficient documentation

## 2014-11-04 DIAGNOSIS — F17210 Nicotine dependence, cigarettes, uncomplicated: Secondary | ICD-10-CM | POA: Diagnosis not present

## 2014-11-04 DIAGNOSIS — I429 Cardiomyopathy, unspecified: Secondary | ICD-10-CM

## 2014-11-04 DIAGNOSIS — I2510 Atherosclerotic heart disease of native coronary artery without angina pectoris: Secondary | ICD-10-CM | POA: Insufficient documentation

## 2014-11-04 DIAGNOSIS — I519 Heart disease, unspecified: Secondary | ICD-10-CM

## 2014-11-04 DIAGNOSIS — Z7982 Long term (current) use of aspirin: Secondary | ICD-10-CM | POA: Diagnosis not present

## 2014-11-04 DIAGNOSIS — Z720 Tobacco use: Secondary | ICD-10-CM

## 2014-11-04 LAB — CBC
HCT: 41.8 % (ref 36.0–46.0)
Hemoglobin: 13.8 g/dL (ref 12.0–15.0)
MCH: 30.2 pg (ref 26.0–34.0)
MCHC: 33 g/dL (ref 30.0–36.0)
MCV: 91.5 fL (ref 78.0–100.0)
Platelets: 219 10*3/uL (ref 150–400)
RBC: 4.57 MIL/uL (ref 3.87–5.11)
RDW: 13.7 % (ref 11.5–15.5)
WBC: 7.1 10*3/uL (ref 4.0–10.5)

## 2014-11-04 LAB — BASIC METABOLIC PANEL
Anion gap: 7 (ref 5–15)
BUN: 8 mg/dL (ref 6–23)
CO2: 24 mmol/L (ref 19–32)
Calcium: 9.6 mg/dL (ref 8.4–10.5)
Chloride: 109 mEq/L (ref 96–112)
Creatinine, Ser: 0.69 mg/dL (ref 0.50–1.10)
GFR calc Af Amer: >90 mL/min (ref >90)
GFR calc non Af Amer: >90 mL/min (ref >90)
Glucose, Bld: 92 mg/dL (ref 70–99)
Potassium: 4.2 mmol/L (ref 3.5–5.1)
Sodium: 140 mmol/L (ref 135–145)

## 2014-11-04 LAB — PROTIME-INR
INR: 1.07 (ref 0.00–1.49)
Prothrombin Time: 14 seconds (ref 11.6–15.2)

## 2014-11-04 LAB — TSH: TSH: 4.18 u[IU]/mL (ref 0.350–4.500)

## 2014-11-04 LAB — LIPID PANEL
Cholesterol: 125 mg/dL (ref 0–200)
HDL: 51 mg/dL (ref >39)
LDL Cholesterol: 60 mg/dL (ref 0–99)
Total CHOL/HDL Ratio: 2.5 RATIO
Triglycerides: 69 mg/dL (ref <150)
VLDL: 14 mg/dL (ref 0–40)

## 2014-11-04 LAB — BRAIN NATRIURETIC PEPTIDE: B Natriuretic Peptide: 576.8 pg/mL — ABNORMAL HIGH (ref 0.0–100.0)

## 2014-11-04 MED ORDER — AMLODIPINE BESYLATE 5 MG PO TABS: 5.0000 mg | ORAL_TABLET | Freq: Every day | ORAL | Status: DC

## 2014-11-04 MED ORDER — CARVEDILOL 6.25 MG PO TABS: 6.2500 mg | ORAL_TABLET | Freq: Two times a day (BID) | ORAL | Status: DC

## 2014-11-04 NOTE — Patient Instructions (Signed)
DECREASE Amlodipine to 5 mg daily START Carvedilol 6.25 mg twice a day  Labs today  Your physician has requested that you have a cardiac catheterization. Cardiac catheterization is used to diagnose and/or treat various heart conditions. Doctors may recommend this procedure for a number of different reasons. The most common reason is to evaluate chest pain. Chest pain can be a symptom of coronary artery disease (CAD), and cardiac catheterization can show whether plaque is narrowing or blocking your heart's arteries. This procedure is also used to evaluate the valves, as well as measure the blood flow and oxygen levels in different parts of your heart. For further information please visit [email or web address]. Please follow instruction sheet, as given.  Your physician recommends that you schedule a follow-up appointment in: 2 weeks with Dr.McLean

## 2014-11-04 NOTE — Progress Notes (Addendum)
Patient ID: Stacy Hernandez, female   DOB: [birth year], 58 y.o.   MRN: [id number] PCP: Dr. Shelia Media  58 yo with history of HTN, intermittent LBBB, and mild nonischemic cardiomyopathy presents for cardiology followup.  She was admitted with chest pain in 6/13.  ECG showed intermittent LBBB.  Echo showed EF 45-50%.  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  She has been a chronic smoker, now 2 ppd.   She had been doing well until about 3 months ago.  She has developed chest pressure when she walks fast or goes up steps.  Chest pressure resolves with rest and does not occur at rest.  She also gets short of breath along with chest pressure.  She is under a lot of stress at home.    She saw Richardson Dopp this month and was set up for echo and Cardiolite.  Echo showed EF 10-15% (significant fall).  She had a Lexiscan Cardiolite with EF 25% and apical anteroseptal and apical fixed perfusion defect.   Labs (12/13): LDL 80, HDL 46, K 4, creatinine 0.7 Labs (6/14): K 4, creatinine 0.8, LDL 73, HDL 45 Labs (12/15): K 4.2, creatinine 0.7, HCT 43.3  ECG: NSR, IVCD (LBBB-like) with QRS 156 msec  PMH: 1. HTN 2. Hyperlipidemia 3. Prior smoker 4. Intermittent LBBB/IVCD 5. Hypothyroidism 6. Cardiomyopathy: Echo (6/13) with EF 45-50%, paradoxical septal motion.  LHC (6/13) with 40% D1 stenosis, 30% PLV stenosis.  Echo (12/15) with EF 10-15%, moderately dilated LV, diffuse hypokinesis, moderate MR.   Lexiscan Cardiolite (12/15) with EF 25%, diffuse hypokinesis, apical anteroseptal and apical fixed defect.   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    FH: Father CABG at 58   ROS: All systems reviewed and negative except as per HPI.   Current Outpatient Prescriptions  Medication Sig Dispense Refill  . amLODipine (NORVASC) 5 MG tablet Take 1 tablet (5 mg total) by mouth daily. 30 tablet 3  . aspirin 81 MG tablet Take 81 mg by mouth daily.    Marland Kitchen atorvastatin  (LIPITOR) 10 MG tablet TAKE 1 TABLET BY MOUTH AT BEDTIME 30 tablet 6  . Cholecalciferol (VITAMIN D-3 PO) Take 2,000 Units by mouth daily.     Marland Kitchen HYDROcodone-acetaminophen (NORCO) 5-325 MG per tablet Take 1 tablet by mouth every 6 (six) hours as needed.    . hydroxychloroquine (PLAQUENIL) 200 MG tablet Take 200 mg by mouth 2 (two) times daily.  5  . Inositol Niacinate (NIACIN FLUSH FREE) 500 MG CAPS Take 3 capsules by mouth daily.    Marland Kitchen levothyroxine (SYNTHROID, LEVOTHROID) 100 MCG tablet Take 100 mcg by mouth daily before breakfast.    . losartan (COZAAR) 50 MG tablet Take 50 mg by mouth daily.    . Multiple Vitamins-Minerals (MULTIVITAMIN WITH MINERALS) tablet Take 1 tablet by mouth daily.    . nitroGLYCERIN (NITROSTAT) 0.4 MG SL tablet Place 1 tablet (0.4 mg total) under the tongue every 5 (five) minutes as needed for chest pain. 25 tablet 3  . carvedilol (COREG) 6.25 MG tablet Take 1 tablet (6.25 mg total) by mouth 2 (two) times daily with a meal. 60 tablet 3   No current facility-administered medications for this encounter.    BP 112/72 mmHg  Pulse 77  Wt 170 lb 6.4 oz (77.293 kg)  SpO2 97% General: NAD Neck: No JVD, no thyromegaly or thyroid nodule.  Lungs: Clear to auscultation bilaterally with normal respiratory effort. CV: Nondisplaced  PMI.  Heart regular S1/S2, no S3/S4, 1/6 SEM.  No peripheral edema.  No carotid bruit.  Normal pedal pulses.  Abdomen: Soft, nontender, no hepatosplenomegaly, no distention.  Neurologic: Alert and oriented x 3.  Psych: Normal affect. Extremities: No clubbing or cyanosis.   Assessment/Plan: 1. Cardiomyopathy: In the past, she had EF 45-50% with no significant CAD (2013).  I thought that she had a mild LBBB-related nonischemic cardiomyopathy.  However, over the last 3 months she has developed exertional chest pain and dyspnea.  Echo and Cardiolite showed marked worsening of her LV systolic function and Cardiolite suggested possible prior MI.  I am  concerned that she now has an ischemic cardiomyopathy.  - She needs right and left heart catheterization.  I will arrange for next week.  - Continue losartan and add Coreg 6.25 mg bid.  To give BP room, decrease amlodipine to 5 mg daily.  - If no significant CAD/revascularization, may benefit from CRT.  - BNP today.  2. CAD: Nonobstructive on prior cath in 2013.  However, she has continued smoking and now has symptoms concerning for angina over the last 3 months.  Cardiolite showed possible prior infarction but no definite ischemia.  - As above, needs LHC.  - Continue ASA 81 and statin, will check lipids and aim for LDL < 70.  3. Smoking: I again strongly encouraged her to quit.   Loralie Champagne 11/04/2014

## 2014-11-09 ENCOUNTER — Telehealth (HOSPITAL_COMMUNITY): Payer: Self-pay | Admitting: Cardiology

## 2014-11-09 NOTE — Telephone Encounter (Signed)
Pt is scheduled for R/L heart cath Cpt code- 93453  icd 10- i50.22 With pts current insurance- [employer] No pre cert required per Otila Kluver

## 2014-11-10 ENCOUNTER — Other Ambulatory Visit (HOSPITAL_COMMUNITY): Payer: Self-pay | Admitting: *Deleted

## 2014-11-10 ENCOUNTER — Ambulatory Visit: Payer: 59 | Admitting: Physician Assistant

## 2014-11-12 ENCOUNTER — Encounter (HOSPITAL_COMMUNITY)
Admission: RE | Disposition: A | Discharge status: Home / Self Care | Payer: Self-pay | Source: Ambulatory Visit | Attending: Cardiology

## 2014-11-12 ENCOUNTER — Ambulatory Visit (HOSPITAL_COMMUNITY)
Admission: RE | Admit: 2014-11-12 | Discharge: 2014-11-12 | Disposition: A | Discharge status: Home / Self Care | Payer: 59 | Source: Ambulatory Visit | Attending: Cardiology | Admitting: Cardiology

## 2014-11-12 ENCOUNTER — Encounter (HOSPITAL_COMMUNITY): Payer: Self-pay | Admitting: Cardiology

## 2014-11-12 DIAGNOSIS — E785 Hyperlipidemia, unspecified: Secondary | ICD-10-CM | POA: Diagnosis not present

## 2014-11-12 DIAGNOSIS — Z79899 Other long term (current) drug therapy: Secondary | ICD-10-CM | POA: Diagnosis not present

## 2014-11-12 DIAGNOSIS — I447 Left bundle-branch block, unspecified: Secondary | ICD-10-CM | POA: Diagnosis not present

## 2014-11-12 DIAGNOSIS — Z7982 Long term (current) use of aspirin: Secondary | ICD-10-CM | POA: Insufficient documentation

## 2014-11-12 DIAGNOSIS — I272 Other secondary pulmonary hypertension: Secondary | ICD-10-CM | POA: Insufficient documentation

## 2014-11-12 DIAGNOSIS — R079 Chest pain, unspecified: Secondary | ICD-10-CM

## 2014-11-12 DIAGNOSIS — F17210 Nicotine dependence, cigarettes, uncomplicated: Secondary | ICD-10-CM | POA: Insufficient documentation

## 2014-11-12 DIAGNOSIS — I252 Old myocardial infarction: Secondary | ICD-10-CM | POA: Insufficient documentation

## 2014-11-12 DIAGNOSIS — E039 Hypothyroidism, unspecified: Secondary | ICD-10-CM | POA: Diagnosis not present

## 2014-11-12 DIAGNOSIS — I255 Ischemic cardiomyopathy: Secondary | ICD-10-CM | POA: Diagnosis not present

## 2014-11-12 DIAGNOSIS — I10 Essential (primary) hypertension: Secondary | ICD-10-CM | POA: Insufficient documentation

## 2014-11-12 DIAGNOSIS — I2510 Atherosclerotic heart disease of native coronary artery without angina pectoris: Secondary | ICD-10-CM | POA: Insufficient documentation

## 2014-11-12 DIAGNOSIS — I509 Heart failure, unspecified: Secondary | ICD-10-CM | POA: Diagnosis present

## 2014-11-12 DIAGNOSIS — R9439 Abnormal result of other cardiovascular function study: Secondary | ICD-10-CM

## 2014-11-12 HISTORY — PX: LEFT AND RIGHT HEART CATHETERIZATION WITH CORONARY ANGIOGRAM: SHX5449

## 2014-11-12 LAB — POCT I-STAT 3, ART BLOOD GAS (G3+)
Acid-base deficit: 3 mmol/L — ABNORMAL HIGH (ref 0.0–2.0)
Bicarbonate: 21.9 mEq/L (ref 20.0–24.0)
O2 Saturation: 89 %
TCO2: 23 mmol/L (ref 0–100)
pCO2 arterial: 39.4 mmHg (ref 35.0–45.0)
pH, Arterial: 7.354 (ref 7.350–7.450)
pO2, Arterial: 60 mmHg — ABNORMAL LOW (ref 80.0–100.0)

## 2014-11-12 LAB — POCT I-STAT 3, VENOUS BLOOD GAS (G3P V)
Acid-base deficit: 2 mmol/L (ref 0.0–2.0)
Bicarbonate: 23.4 mEq/L (ref 20.0–24.0)
O2 Saturation: 62 %
TCO2: 25 mmol/L (ref 0–100)
pCO2, Ven: 42.4 mmHg — ABNORMAL LOW (ref 45.0–50.0)
pH, Ven: 7.351 — ABNORMAL HIGH (ref 7.250–7.300)
pO2, Ven: 34 mmHg (ref 30.0–45.0)

## 2014-11-12 SURGERY — LEFT AND RIGHT HEART CATHETERIZATION WITH CORONARY ANGIOGRAM
Anesthesia: LOCAL

## 2014-11-12 MED ORDER — SODIUM CHLORIDE 0.9 % IV SOLN
INTRAVENOUS | Status: DC
  Administered 2014-11-12: 07:00:00 via INTRAVENOUS

## 2014-11-12 MED ORDER — SODIUM CHLORIDE 0.9 % IJ SOLN: 3.0000 mL | INTRAMUSCULAR | Status: DC | PRN

## 2014-11-12 MED ORDER — ONDANSETRON HCL 4 MG/2ML IJ SOLN: 4.0000 mg | Freq: Four times a day (QID) | INTRAMUSCULAR | Status: DC | PRN

## 2014-11-12 MED ORDER — SODIUM CHLORIDE 0.9 % IV SOLN: 250.0000 mL | INTRAVENOUS | Status: DC | PRN

## 2014-11-12 MED ORDER — HEPARIN SODIUM (PORCINE) 1000 UNIT/ML IJ SOLN
INTRAMUSCULAR | Status: AC
  Filled 2014-11-12: qty 1

## 2014-11-12 MED ORDER — MIDAZOLAM HCL 2 MG/2ML IJ SOLN
INTRAMUSCULAR | Status: AC
  Filled 2014-11-12: qty 2

## 2014-11-12 MED ORDER — SODIUM CHLORIDE 0.9 % IJ SOLN: 3.0000 mL | Freq: Two times a day (BID) | INTRAMUSCULAR | Status: DC

## 2014-11-12 MED ORDER — VERAPAMIL HCL 2.5 MG/ML IV SOLN
INTRAVENOUS | Status: AC
  Filled 2014-11-12: qty 2

## 2014-11-12 MED ORDER — LIDOCAINE HCL (PF) 1 % IJ SOLN
INTRAMUSCULAR | Status: AC
  Filled 2014-11-12: qty 30

## 2014-11-12 MED ORDER — ASPIRIN 81 MG PO CHEW
CHEWABLE_TABLET | ORAL | Status: AC
  Filled 2014-11-12: qty 1

## 2014-11-12 MED ORDER — FENTANYL CITRATE 0.05 MG/ML IJ SOLN
INTRAMUSCULAR | Status: AC
  Filled 2014-11-12: qty 2

## 2014-11-12 MED ORDER — NITROGLYCERIN 1 MG/10 ML FOR IR/CATH LAB
INTRA_ARTERIAL | Status: AC
  Filled 2014-11-12: qty 10

## 2014-11-12 MED ORDER — ACETAMINOPHEN 325 MG PO TABS: 650.0000 mg | ORAL_TABLET | ORAL | Status: DC | PRN

## 2014-11-12 MED ORDER — SODIUM CHLORIDE 0.9 % IV SOLN: INTRAVENOUS | Status: AC

## 2014-11-12 MED ORDER — ASPIRIN 81 MG PO CHEW
81.0000 mg | CHEWABLE_TABLET | ORAL | Status: AC
  Administered 2014-11-12: 81 mg via ORAL

## 2014-11-12 MED ORDER — HEPARIN (PORCINE) IN NACL 2-0.9 UNIT/ML-% IJ SOLN
INTRAMUSCULAR | Status: AC
  Filled 2014-11-12: qty 1000

## 2014-11-12 NOTE — Interval H&P Note (Signed)
Cath Lab Visit (complete for each Cath Lab visit)  Clinical Evaluation Leading to the Procedure:   ACS: No.  Non-ACS:    Anginal Classification: CCS III  Anti-ischemic medical therapy: Maximal Therapy (2 or more classes of medications)  Non-Invasive Test Results: Intermediate-risk stress test findings: cardiac mortality 1-3%/year  Prior CABG: No previous CABG      History and Physical Interval Note:  11/12/2014 7:43 AM  Bing Matter  has presented today for surgery, with the diagnosis of hf  The various methods of treatment have been discussed with the patient and family. After consideration of risks, benefits and other options for treatment, the patient has consented to  Procedure(s): LEFT AND RIGHT HEART CATHETERIZATION WITH CORONARY ANGIOGRAM (N/A) as a surgical intervention .  The patient's history has been reviewed, patient examined, no change in status, stable for surgery.  I have reviewed the patient's chart and labs.  Questions were answered to the patient's satisfaction.     D[place] [employer]

## 2014-11-12 NOTE — Discharge Instructions (Signed)
Return To Work __[name]___ was treated at our facility. 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 Your employer should be aware of your condition and can help with the necessary work activity restrictions. If you wish to return to work sooner than the date above, or if you have further problems which make it difficult for you to return at that time, please call us or your caregiver. __Dr. Loralie Champagne Physician Name (Printed) _________________________________________ Physician Signature  ___01/08/2016____ Date Document Released: 10/22/2005 Document Revised: 01/14/2012 Document Reviewed: 04/08/2007 [facility] Patient Information 2015 [place], [place]. This information is not intended to replace advice given to you by your health care provider. Make sure you discuss any questions you have with your health care provider.

## 2014-11-12 NOTE — Progress Notes (Signed)
Reported radial bleed/hematoma to Dr. Aundra Dubin. Order received to keep pt additional hour for observation. Pt informed. Right radial site stable at this time.

## 2014-11-12 NOTE — CV Procedure (Signed)
    Cardiac Catheterization Procedure Note  Name: Stacy Hernandez MRN: [id number] DOB: 1955-12-24  Procedure: Right Heart Cath, Left Heart Cath, Selective Coronary Angiography, LV angiography  Indication: Abnormal echo and Cardiolite, exertional chest pain.    Procedural Details: The right radial area and left brachial area were prepped, draped, and anesthetized with 1% lidocaine. There was a pre-existing IV in the left brachial area that was replaced with a 5 French venous sheath. A Swan-Ganz catheter was used for the right heart catheterization. Standard protocol was followed for recording of right heart pressures and sampling of oxygen saturations. Fick cardiac output was calculated. The right radial artery was entered using modified Seldinger technique and a 6 French sheath was placed.  The patient received 3 mg IA verapamil, 200 mcg IA NTG, and weight-based IV heparin.  Standard Judkins catheters were used for selective coronary angiography and left ventriculography. There were no immediate procedural complications. The patient was transferred to the post catheterization recovery area for further monitoring.  Procedural Findings: Hemodynamics (mmHg) RA mean 8 RV 41/11 PA 37/19 PCWP mean 19 LV 116/20 AO 110/67  Oxygen saturations: PA 62% AO 89%  Cardiac Output (Fick) 4.65  Cardiac Index (Fick) 2.63 PVR 1.5 WU   Coronary angiography: Coronary dominance: right  Left mainstem: Short, no significant disease.   Left anterior descending (LAD): Mild luminal irregularities.   Left circumflex (LCx): Mild luminal irregularities.  Right coronary artery (RCA): Mild luminal irregularities.   Left ventriculography: Moderate diffuse hypokinesis, LVEF is estimated at 30%, there is no significant mitral regurgitation   Final Conclusions:  Very mild elevation in right and left heart filling pressures.  Mild pulmonary venous hypertension.  No significant CAD.  EF 30% with diffuse  hypokinesis.  Nonischemic cardiomyopathy. I will add Lasix 20 mg daily with KCl 10 daily to her regimen.   Loralie Champagne 11/12/2014, 8:52 AM

## 2014-11-12 NOTE — H&P (View-Only) (Signed)
Patient ID: Stacy Hernandez, female   DOB: 09/26/1956, 59 y.o.   MRN: [id number] PCP: Dr. Shelia Media  59 yo with history of HTN, intermittent LBBB, and mild nonischemic cardiomyopathy presents for cardiology followup.  She was admitted with chest pain in 6/13.  ECG showed intermittent LBBB.  Echo showed EF 45-50%.  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  She has been a chronic smoker, now 2 ppd.   She had been doing well until about 3 months ago.  She has developed chest pressure when she walks fast or goes up steps.  Chest pressure resolves with rest and does not occur at rest.  She also gets short of breath along with chest pressure.  She is under a lot of stress at home.    She saw Richardson Dopp this month and was set up for echo and Cardiolite.  Echo showed EF 10-15% (significant fall).  She had a Lexiscan Cardiolite with EF 25% and apical anteroseptal and apical fixed perfusion defect.   Labs (12/13): LDL 80, HDL 46, K 4, creatinine 0.7 Labs (6/14): K 4, creatinine 0.8, LDL 73, HDL 45 Labs (12/15): K 4.2, creatinine 0.7, HCT 43.3  ECG: NSR, IVCD (LBBB-like) with QRS 156 msec  PMH: 1. HTN 2. Hyperlipidemia 3. Prior smoker 4. Intermittent LBBB/IVCD 5. Hypothyroidism 6. Cardiomyopathy: Echo (6/13) with EF 45-50%, paradoxical septal motion.  LHC (6/13) with 40% D1 stenosis, 30% PLV stenosis.  Echo (12/15) with EF 10-15%, moderately dilated LV, diffuse hypokinesis, moderate MR.   Lexiscan Cardiolite (12/15) with EF 25%, diffuse hypokinesis, apical anteroseptal and apical fixed defect.   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    FH: Father CABG at 72   ROS: All systems reviewed and negative except as per HPI.   Current Outpatient Prescriptions  Medication Sig Dispense Refill  . amLODipine (NORVASC) 5 MG tablet Take 1 tablet (5 mg total) by mouth daily. 30 tablet 3  . aspirin 81 MG tablet Take 81 mg by mouth daily.    Marland Kitchen atorvastatin  (LIPITOR) 10 MG tablet TAKE 1 TABLET BY MOUTH AT BEDTIME 30 tablet 6  . Cholecalciferol (VITAMIN D-3 PO) Take 2,000 Units by mouth daily.     Marland Kitchen HYDROcodone-acetaminophen (NORCO) 5-325 MG per tablet Take 1 tablet by mouth every 6 (six) hours as needed.    . hydroxychloroquine (PLAQUENIL) 200 MG tablet Take 200 mg by mouth 2 (two) times daily.  5  . Inositol Niacinate (NIACIN FLUSH FREE) 500 MG CAPS Take 3 capsules by mouth daily.    Marland Kitchen levothyroxine (SYNTHROID, LEVOTHROID) 100 MCG tablet Take 100 mcg by mouth daily before breakfast.    . losartan (COZAAR) 50 MG tablet Take 50 mg by mouth daily.    . Multiple Vitamins-Minerals (MULTIVITAMIN WITH MINERALS) tablet Take 1 tablet by mouth daily.    . nitroGLYCERIN (NITROSTAT) 0.4 MG SL tablet Place 1 tablet (0.4 mg total) under the tongue every 5 (five) minutes as needed for chest pain. 25 tablet 3  . carvedilol (COREG) 6.25 MG tablet Take 1 tablet (6.25 mg total) by mouth 2 (two) times daily with a meal. 60 tablet 3   No current facility-administered medications for this encounter.    BP 112/72 mmHg  Pulse 77  Wt 170 lb 6.4 oz (77.293 kg)  SpO2 97% General: NAD Neck: No JVD, no thyromegaly or thyroid nodule.  Lungs: Clear to auscultation bilaterally with normal respiratory effort. CV: Nondisplaced  PMI.  Heart regular S1/S2, no S3/S4, 1/6 SEM.  No peripheral edema.  No carotid bruit.  Normal pedal pulses.  Abdomen: Soft, nontender, no hepatosplenomegaly, no distention.  Neurologic: Alert and oriented x 3.  Psych: Normal affect. Extremities: No clubbing or cyanosis.   Assessment/Plan: 1. Cardiomyopathy: In the past, she had EF 45-50% with no significant CAD (2013).  I thought that she had a mild LBBB-related nonischemic cardiomyopathy.  However, over the last 3 months she has developed exertional chest pain and dyspnea.  Echo and Cardiolite showed marked worsening of her LV systolic function and Cardiolite suggested possible prior MI.  I am  concerned that she now has an ischemic cardiomyopathy.  - She needs right and left heart catheterization.  I will arrange for next week.  - Continue losartan and add Coreg 6.25 mg bid.  To give BP room, decrease amlodipine to 5 mg daily.  - If no significant CAD/revascularization, may benefit from CRT.  - BNP today.  2. CAD: Nonobstructive on prior cath in 2013.  However, she has continued smoking and now has symptoms concerning for angina over the last 3 months.  Cardiolite showed possible prior infarction but no definite ischemia.  - As above, needs LHC.  - Continue ASA 81 and statin, will check lipids and aim for LDL < 70.  3. Smoking: I again strongly encouraged her to quit.   Loralie Champagne 11/04/2014

## 2014-11-19 ENCOUNTER — Encounter (HOSPITAL_COMMUNITY): Payer: 59

## 2014-11-25 ENCOUNTER — Encounter (HOSPITAL_COMMUNITY): Payer: Self-pay

## 2014-11-25 ENCOUNTER — Ambulatory Visit (HOSPITAL_COMMUNITY)
Admission: RE | Admit: 2014-11-25 | Discharge: 2014-11-25 | Disposition: A | Discharge status: Home / Self Care | Payer: 59 | Source: Ambulatory Visit | Attending: Internal Medicine | Admitting: Internal Medicine

## 2014-11-25 VITALS — BP 108/68 | HR 84 | Resp 20 | Wt 170.2 lb

## 2014-11-25 DIAGNOSIS — I429 Cardiomyopathy, unspecified: Secondary | ICD-10-CM | POA: Insufficient documentation

## 2014-11-25 DIAGNOSIS — I252 Old myocardial infarction: Secondary | ICD-10-CM | POA: Diagnosis not present

## 2014-11-25 DIAGNOSIS — I447 Left bundle-branch block, unspecified: Secondary | ICD-10-CM | POA: Insufficient documentation

## 2014-11-25 DIAGNOSIS — I10 Essential (primary) hypertension: Secondary | ICD-10-CM | POA: Insufficient documentation

## 2014-11-25 DIAGNOSIS — I519 Heart disease, unspecified: Secondary | ICD-10-CM

## 2014-11-25 DIAGNOSIS — I2510 Atherosclerotic heart disease of native coronary artery without angina pectoris: Secondary | ICD-10-CM | POA: Insufficient documentation

## 2014-11-25 DIAGNOSIS — E039 Hypothyroidism, unspecified: Secondary | ICD-10-CM | POA: Diagnosis not present

## 2014-11-25 DIAGNOSIS — Z7982 Long term (current) use of aspirin: Secondary | ICD-10-CM | POA: Diagnosis not present

## 2014-11-25 DIAGNOSIS — Z720 Tobacco use: Secondary | ICD-10-CM

## 2014-11-25 DIAGNOSIS — Z79899 Other long term (current) drug therapy: Secondary | ICD-10-CM | POA: Insufficient documentation

## 2014-11-25 DIAGNOSIS — Z87891 Personal history of nicotine dependence: Secondary | ICD-10-CM | POA: Insufficient documentation

## 2014-11-25 DIAGNOSIS — E785 Hyperlipidemia, unspecified: Secondary | ICD-10-CM | POA: Insufficient documentation

## 2014-11-25 LAB — BASIC METABOLIC PANEL
Anion gap: 6 (ref 5–15)
BUN: 12 mg/dL (ref 6–23)
CO2: 30 mmol/L (ref 19–32)
Calcium: 10 mg/dL (ref 8.4–10.5)
Chloride: 106 mEq/L (ref 96–112)
Creatinine, Ser: 0.74 mg/dL (ref 0.50–1.10)
GFR calc Af Amer: >90 mL/min (ref >90)
GFR calc non Af Amer: >90 mL/min (ref >90)
Glucose, Bld: 94 mg/dL (ref 70–99)
Potassium: 4.3 mmol/L (ref 3.5–5.1)
Sodium: 142 mmol/L (ref 135–145)

## 2014-11-25 LAB — BRAIN NATRIURETIC PEPTIDE: B Natriuretic Peptide: 326.3 pg/mL — ABNORMAL HIGH (ref 0.0–100.0)

## 2014-11-25 MED ORDER — SACUBITRIL-VALSARTAN 24-26 MG PO TABS: 1.0000 | ORAL_TABLET | Freq: Two times a day (BID) | ORAL | Status: DC

## 2014-11-25 NOTE — Patient Instructions (Signed)
Labs today and again in 2 weeks (BMET)  STOP Amlodipine and Losartan START Entresto 24/26 mg twice a day  Your physician has requested that you have a cardiac MRI. Cardiac MRI uses a computer to create images of your heart as its beating, producing both still and moving pictures of your heart and major blood vessels. For further information please visit [email or web address] Please follow the instruction sheet given to you today for more information. SCHEDULING WILL CONTACT YOU WITH THIS APPOINTMENT  Your physician recommends that you schedule a follow-up appointment in: 3 weeks  Do the following things EVERYDAY: 1) Weigh yourself in the morning before breakfast. Write it down and keep it in a log. 2) Take your medicines as prescribed 3) Eat low salt foods-Limit salt (sodium) to 2000 mg per day.  4) Stay as active as you can everyday 5) Limit all fluids for the day to less than 2 liters 6)

## 2014-11-25 NOTE — Progress Notes (Signed)
Patient ID: Stacy Hernandez, female   DOB: 1956-08-01, 59 y.o.   MRN: [id number] PCP: Dr. Shelia Media  59 yo with history of HTN, intermittent LBBB, and nonischemic cardiomyopathy presents for cardiology followup.  She was admitted with chest pain in 6/13.  ECG showed intermittent LBBB.  Echo showed EF 45-50%.  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    She had been doing well until about 3 months ago.  Around that time, she developed chest pressure when she walked fast or went up steps.  Chest pressure resolved with rest and did not occur at rest.  She also got shortness of breath along with chest pressure.    She was set up for echo and Cardiolite in 12/15.  Echo showed EF 10-15% (significant fall).  She had a Lexiscan Cardiolite with EF 25% and apical anteroseptal and apical fixed perfusion defect.   Therefore, she was sent for left and right heart catheterization in 1/16.  This showed mild elevation in LV and RV filling pressures and no significant CAD. She was started on Lasix 20 mg daily.   She returns for followup.  She says that she feels much better.  Her breathing is better.  She does not get short of breath walking on flat ground and can walk up a flight of steps.  She still has congestion when she lies flat at night (suspect orthopnea).  No further chest pain or tightness.  She has not smoked in 2 week.    Labs (12/13): LDL 80, HDL 46, K 4, creatinine 0.7 Labs (6/14): K 4, creatinine 0.8, LDL 73, HDL 45 Labs (12/15): K 4.2, creatinine 0.7, HCT 43.3, LDL 60, HDL 51, BNP 577, TSH normal  ECG: NSR, IVCD (LBBB-like) with QRS 156 msec  PMH: 1. HTN 2. Hyperlipidemia 3. Prior smoker 4. Intermittent LBBB/IVCD 5. Hypothyroidism 6. Cardiomyopathy: Nonischemic.  Echo (6/13) with EF 45-50%, paradoxical septal motion.  LHC (6/13) with 40% D1 stenosis, 30% PLV stenosis.  Echo (12/15) with EF 10-15%, moderately dilated LV, diffuse hypokinesis, moderate MR.   Lexiscan Cardiolite (12/15) with EF 25%,  diffuse hypokinesis, apical anteroseptal and apical fixed defect. LHC/RHC (1/16) with mean RA 8, PA 37/19, mean PCWP 19, CI 2.63, EF 30%, no significant CAD.   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  Quit smoking in 1/16.    FH: Father CABG at 70   ROS: All systems reviewed and negative except as per HPI.   Current Outpatient Prescriptions  Medication Sig Dispense Refill  . amoxicillin-clavulanate (AUGMENTIN) 500-125 MG per tablet Take 1 tablet by mouth 2 (two) times daily.    Marland Kitchen aspirin 81 MG tablet Take 81 mg by mouth daily.    Marland Kitchen atorvastatin (LIPITOR) 10 MG tablet TAKE 1 TABLET BY MOUTH AT BEDTIME 30 tablet 6  . carvedilol (COREG) 6.25 MG tablet Take 1 tablet (6.25 mg total) by mouth 2 (two) times daily with a meal. 60 tablet 3  . Cholecalciferol (VITAMIN D-3 PO) Take 2,000 Units by mouth daily.     . furosemide (LASIX) 20 MG tablet Take 20 mg by mouth daily.    Marland Kitchen HYDROcodone-acetaminophen (NORCO) 5-325 MG per tablet Take 1 tablet by mouth every 6 (six) hours as needed.    . hydroxychloroquine (PLAQUENIL) 200 MG tablet Take 200 mg by mouth 2 (two) times daily.  5  . Inositol Niacinate (NIACIN FLUSH FREE) 500 MG CAPS Take 3 capsules by mouth daily.    Marland Kitchen  levothyroxine (SYNTHROID, LEVOTHROID) 100 MCG tablet Take 100 mcg by mouth daily before breakfast.    . Multiple Vitamins-Minerals (MULTIVITAMIN WITH MINERALS) tablet Take 1 tablet by mouth daily.    . nitroGLYCERIN (NITROSTAT) 0.4 MG SL tablet Place 1 tablet (0.4 mg total) under the tongue every 5 (five) minutes as needed for chest pain. 25 tablet 3  . potassium chloride (K-DUR) 10 MEQ tablet Take 10 mEq by mouth daily.    . sacubitril-valsartan (ENTRESTO) 24-26 MG Take 1 tablet by mouth 2 (two) times daily. 60 tablet 3   No current facility-administered medications for this encounter.    BP 108/68 mmHg  Pulse 84  Resp 20  Wt 170 lb 4 oz (77.225 kg)  SpO2 98% General: NAD Neck: No JVD, no  thyromegaly or thyroid nodule.  Lungs: Clear to auscultation bilaterally with normal respiratory effort. CV: Nondisplaced PMI.  Heart regular S1/S2, no S3/S4, 1/6 SEM.  No peripheral edema.  No carotid bruit.  Normal pedal pulses.  Abdomen: Soft, nontender, no hepatosplenomegaly, no distention.  Neurologic: Alert and oriented x 3.  Psych: Normal affect. Extremities: No clubbing or cyanosis.   Assessment/Plan: 1. Cardiomyopathy: Nonischemic cardiomyopathy. In the past, she had EF 45-50% with no significant CAD (2013).  I thought that she had a mild LBBB-related nonischemic cardiomyopathy.  However, in the fall of 2015 she developed exertional chest pain and dyspnea.  Echo and Cardiolite showed marked worsening of her LV systolic function (EF 86-76% by echo) and Cardiolite suggested possible prior MI.  Left heart cath in 1/16 showed no significant CAD.  RHC showed mildly elevated right and left heart filling pressures and normal CI.  She was started on Lasix and feels better.  - Continue Coreg. - Stop amlodipine and losartan and start Entresto 26/29 mg bid.  Will try to titrate this up at subsequent visits.  - BMET in 2 wks. Check SPEP/UPEP/BNP/BMET today.  - Cardiac MRI to assess for infiltrative disease.   - Echo in 5/16 (6 months medical therapy) to decide on need for CRT-D (LBBB-like IVCD).   2. Smoking: She has quit.  I congratulated her.   Loralie Champagne 11/25/2014

## 2014-11-26 ENCOUNTER — Encounter (HOSPITAL_COMMUNITY): Payer: 59

## 2014-11-29 ENCOUNTER — Telehealth (HOSPITAL_COMMUNITY): Payer: Self-pay | Admitting: Vascular Surgery

## 2014-11-29 LAB — PROTEIN ELECTROPHORESIS, SERUM
Albumin ELP: 57.5 % (ref 55.8–66.1)
Alpha-1-Globulin: 4.1 % (ref 2.9–4.9)
Alpha-2-Globulin: 11.1 % (ref 7.1–11.8)
Beta 2: 5.6 % (ref 3.2–6.5)
Beta Globulin: 6.2 % (ref 4.7–7.2)
Gamma Globulin: 15.5 % (ref 11.1–18.8)
M-Spike, %: NOT DETECTED g/dL
Total Protein ELP: 7.3 g/dL (ref 6.0–8.3)

## 2014-11-29 NOTE — Telephone Encounter (Signed)
ptr left message stating Dr Aundra Dubin prescribed a medication to strengthen pt heart, pt wants someone to call [facility] to tell why she is on the medication because they wont fill th e prescription.. Please advise

## 2014-11-29 NOTE — Telephone Encounter (Signed)
Attempted to return call to find out which medication patient had trouble getting from pharmacy.  Advised to call back and let us know name of medication.  Renee Pain

## 2014-11-30 ENCOUNTER — Telehealth (HOSPITAL_COMMUNITY): Payer: Self-pay | Admitting: Vascular Surgery

## 2014-11-30 LAB — UIFE/LIGHT CHAINS/TP QN, 24-HR UR
Albumin, U: DETECTED
Alpha 1, Urine: DETECTED — AB
Alpha 2, Urine: DETECTED — AB
Beta, Urine: DETECTED — AB
Gamma Globulin, Urine: DETECTED — AB
Total Protein, Urine: 7 mg/dL (ref 5–24)

## 2014-11-30 NOTE — Telephone Encounter (Signed)
Pt calling about her [employer], pt can not get it filled without a auth.. She wants to know what she needs to do .Marland Kitchen Please advise

## 2014-12-02 NOTE — Telephone Encounter (Signed)
Insurance will not pay for medication, will need to complete PA in order for pt to  Receive meds next month Pt was able to receive 30 day free supply from company. Advised we will work on [employer]

## 2014-12-08 ENCOUNTER — Ambulatory Visit (HOSPITAL_COMMUNITY)
Admission: RE | Admit: 2014-12-08 | Discharge: 2014-12-08 | Disposition: A | Discharge status: Home / Self Care | Payer: 59 | Source: Ambulatory Visit | Attending: Internal Medicine | Admitting: Internal Medicine

## 2014-12-08 DIAGNOSIS — I519 Heart disease, unspecified: Secondary | ICD-10-CM | POA: Insufficient documentation

## 2014-12-08 DIAGNOSIS — I5022 Chronic systolic (congestive) heart failure: Secondary | ICD-10-CM

## 2014-12-08 LAB — BASIC METABOLIC PANEL
Anion gap: 7 (ref 5–15)
BUN: 14 mg/dL (ref 6–23)
CO2: 28 mmol/L (ref 19–32)
Calcium: 9.9 mg/dL (ref 8.4–10.5)
Chloride: 106 mmol/L (ref 96–112)
Creatinine, Ser: 0.78 mg/dL (ref 0.50–1.10)
GFR calc Af Amer: >90 mL/min (ref >90)
GFR calc non Af Amer: >90 mL/min (ref >90)
Glucose, Bld: 81 mg/dL (ref 70–99)
Potassium: 4.2 mmol/L (ref 3.5–5.1)
Sodium: 141 mmol/L (ref 135–145)

## 2014-12-10 ENCOUNTER — Encounter: Payer: Self-pay | Admitting: Cardiology

## 2014-12-16 ENCOUNTER — Ambulatory Visit (HOSPITAL_COMMUNITY)
Admission: RE | Admit: 2014-12-16 | Discharge: 2014-12-16 | Disposition: A | Discharge status: Home / Self Care | Payer: 59 | Source: Ambulatory Visit | Attending: Internal Medicine | Admitting: Internal Medicine

## 2014-12-16 VITALS — BP 124/66 | HR 80 | Wt 171.0 lb

## 2014-12-16 DIAGNOSIS — I429 Cardiomyopathy, unspecified: Secondary | ICD-10-CM | POA: Diagnosis present

## 2014-12-16 DIAGNOSIS — Z87891 Personal history of nicotine dependence: Secondary | ICD-10-CM | POA: Diagnosis not present

## 2014-12-16 DIAGNOSIS — J069 Acute upper respiratory infection, unspecified: Secondary | ICD-10-CM | POA: Insufficient documentation

## 2014-12-16 DIAGNOSIS — I5022 Chronic systolic (congestive) heart failure: Secondary | ICD-10-CM | POA: Insufficient documentation

## 2014-12-16 DIAGNOSIS — J0100 Acute maxillary sinusitis, unspecified: Secondary | ICD-10-CM

## 2014-12-16 DIAGNOSIS — J019 Acute sinusitis, unspecified: Secondary | ICD-10-CM | POA: Insufficient documentation

## 2014-12-16 MED ORDER — AZITHROMYCIN 250 MG PO TABS: ORAL_TABLET | ORAL | Status: DC

## 2014-12-16 MED ORDER — AMOXICILLIN-POT CLAVULANATE 875-125 MG PO TABS: 1.0000 | ORAL_TABLET | Freq: Two times a day (BID) | ORAL | Status: DC

## 2014-12-16 MED ORDER — SACUBITRIL-VALSARTAN 49-51 MG PO TABS: 1.0000 | ORAL_TABLET | Freq: Two times a day (BID) | ORAL | Status: DC

## 2014-12-16 NOTE — Patient Instructions (Addendum)
INCREASE Entresto to 49-51mg  tablet twice daily. Can take 2 tablets twice daily of current tablets until you run out.  Take Z-Pack (Antibiotic) sent [facility].  Follow up 4 weeks.  Do the following things EVERYDAY: 1) Weigh yourself in the morning before breakfast. Write it down and keep it in a log. 2) Take your medicines as prescribed 3) Eat low salt foods-Limit salt (sodium) to 2000 mg per day.  4) Stay as active as you can everyday 5) Limit all fluids for the day to less than 2 liters

## 2014-12-16 NOTE — Progress Notes (Addendum)
Patient ID: Stacy Hernandez, female   DOB: 06-13-1956, 59 y.o.   MRN: [id number] PCP: Dr. Shelia Media  59 yo with history of HTN, intermittent LBBB, and nonischemic cardiomyopathy presents for cardiology followup.  She was admitted with chest pain in 6/13.  ECG showed intermittent LBBB.  Echo showed EF 45-50%.  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    Developed CP and dyspnea. She was set up for echo and Cardiolite in 12/15.  Echo showed EF 10-15% (significant fall).  She had a Lexiscan Cardiolite with EF 25% and apical anteroseptal and apical fixed perfusion defect.  Therefore, she was sent for left and right heart catheterization in 1/16.  This showed mild elevation in LV and RV filling pressures and no significant CAD. She was started on Lasix 20 mg daily.   She returns for followup.  At last visit amlodipine and losartan stopped and started Entresto 24/26 mg bid.  She says that she feels much better.  Her breathing is better though she has a cold currently with sinus pain for 2 weeks. No fevers or chills. + green drainage Insurance approval still pending for [employer]. Weighing every day. Weight stable 168-169. No edema, no orthopnea or PND.  No further chest pain or tightness.  She has not smoked since 11/07/14.  Occasional wheezing at night. Uses inhaler.   Labs (12/13): LDL 80, HDL 46, K 4, creatinine 0.7 Labs (6/14): K 4, creatinine 0.8, LDL 73, HDL 45 Labs (12/15): K 4.2, creatinine 0.7, HCT 43.3, LDL 60, HDL 51, BNP 577, TSH normal Labs (2/16): K 4.2, creatinine 0.7  ECG: NSR, IVCD (LBBB-like) with QRS 156 msec  PMH: 1. HTN 2. Hyperlipidemia 3. Prior smoker 4. Intermittent LBBB/IVCD 5. Hypothyroidism 6. Cardiomyopathy: Nonischemic.  Echo (6/13) with EF 45-50%, paradoxical septal motion.  LHC (6/13) with 40% D1 stenosis, 30% PLV stenosis.  Echo (12/15) with EF 10-15%, moderately dilated LV, diffuse hypokinesis, moderate MR.   Lexiscan Cardiolite (12/15) with EF 25%, diffuse hypokinesis,  apical anteroseptal and apical fixed defect. LHC/RHC (1/16) with mean RA 8, PA 37/19, mean PCWP 19, CI 2.63, EF 30%, no significant CAD.   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    FH: Father CABG at 70   ROS: All systems reviewed and negative except as per HPI.   Current Outpatient Prescriptions  Medication Sig Dispense Refill  . aspirin 81 MG tablet Take 81 mg by mouth daily.    Marland Kitchen atorvastatin (LIPITOR) 10 MG tablet TAKE 1 TABLET BY MOUTH AT BEDTIME 30 tablet 6  . carvedilol (COREG) 6.25 MG tablet Take 1 tablet (6.25 mg total) by mouth 2 (two) times daily with a meal. 60 tablet 3  . Cholecalciferol (VITAMIN D-3 PO) Take 2,000 Units by mouth daily.     . furosemide (LASIX) 20 MG tablet Take 20 mg by mouth daily.    Marland Kitchen HYDROcodone-acetaminophen (NORCO) 5-325 MG per tablet Take 1 tablet by mouth every 6 (six) hours as needed.    . hydroxychloroquine (PLAQUENIL) 200 MG tablet Take 200 mg by mouth 2 (two) times daily.  5  . Inositol Niacinate (NIACIN FLUSH FREE) 500 MG CAPS Take 3 capsules by mouth daily.    Marland Kitchen levothyroxine (SYNTHROID, LEVOTHROID) 100 MCG tablet Take 100 mcg by mouth daily before breakfast.    . Multiple Vitamins-Minerals (MULTIVITAMIN WITH MINERALS) tablet Take 1 tablet by mouth daily.    . nitroGLYCERIN (NITROSTAT) 0.4 MG  SL tablet Place 1 tablet (0.4 mg total) under the tongue every 5 (five) minutes as needed for chest pain. 25 tablet 3  . potassium chloride (K-DUR) 10 MEQ tablet Take 10 mEq by mouth daily.    . sacubitril-valsartan (ENTRESTO) 24-26 MG Take 1 tablet by mouth 2 (two) times daily. 60 tablet 3   No current facility-administered medications for this encounter.    BP 124/66 mmHg  Pulse 80  Wt 171 lb (77.565 kg)  SpO2 97% General: NAD Neck: No JVD, no thyromegaly or thyroid nodule.  Lungs: Clear to auscultation bilaterally with normal respiratory effort. CV: Nondisplaced PMI.  Heart regular  S1/S2, no S3/S4, 1/6 SEM.  No peripheral edema.  No carotid bruit.  Normal pedal pulses.  Abdomen: Soft, nontender, no hepatosplenomegaly, no distention.  Neurologic: Alert and oriented x 3.  Psych: Normal affect. Extremities: No clubbing or cyanosis.   Assessment/Plan: 1. Cardiomyopathy: Nonischemic cardiomyopathy. In the past, she had EF 45-50% with no significant CAD (2013).  I thought that she had a mild LBBB-related nonischemic cardiomyopathy.  However, in the fall of 2015 she developed exertional chest pain and dyspnea.  Echo and Cardiolite showed marked worsening of her LV systolic function (EF 55-97% by echo) and Cardiolite suggested possible prior MI.  Left heart cath in 1/16 showed no significant CAD.  RHC showed mildly elevated right and left heart filling pressures and normal CI.  She was started on Lasix and feels better.  - Much improved NYAH II-III. Volume status and labs look good.  - Continue Coreg 6.25 bid   - Increase Entresto 49/51 mg bid.  Will provide samples - BMET in 2 wks. Check SPEP/UPEP/BNP/BMET today.  - Cardiac MRI to assess for infiltrative disease on Monday - Echo in 5/16 (6 months medical therapy) to decide on need for CRT-D (LBBB-like IVCD).   2. Smoking: She has quit.  I congratulated her.  3. URI/acute sinusitis: Suggested augmentin but she says that she thinks she had severe diarrhea with this. Requesting z-pack  Glori Bickers MD 12/16/2014

## 2014-12-16 NOTE — Addendum Note (Signed)
Encounter addended by: Renee Pain, RN on: 12/16/2014  2:54 PM<BR>     Documentation filed: Medications, Patient Instructions Section, Orders

## 2014-12-16 NOTE — Addendum Note (Signed)
Encounter addended by: Jolaine Artist, MD on: 12/16/2014  2:36 PM<BR>     Documentation filed: Notes Section

## 2014-12-20 ENCOUNTER — Encounter (HOSPITAL_COMMUNITY): Payer: Self-pay | Admitting: Emergency Medicine

## 2014-12-20 ENCOUNTER — Ambulatory Visit (HOSPITAL_COMMUNITY)
Admission: RE | Admit: 2014-12-20 | Discharge: 2014-12-20 | Disposition: A | Discharge status: Home / Self Care | Payer: 59 | Source: Ambulatory Visit | Attending: Cardiology | Admitting: Cardiology

## 2014-12-20 ENCOUNTER — Emergency Department (HOSPITAL_COMMUNITY)
Admission: EM | Admit: 2014-12-20 | Discharge: 2014-12-20 | Disposition: A | Discharge status: Home / Self Care | Payer: 59 | Source: Home / Self Care | Attending: Emergency Medicine | Admitting: Emergency Medicine

## 2014-12-20 DIAGNOSIS — M5412 Radiculopathy, cervical region: Secondary | ICD-10-CM

## 2014-12-20 DIAGNOSIS — I429 Cardiomyopathy, unspecified: Secondary | ICD-10-CM

## 2014-12-20 MED ORDER — GADOBENATE DIMEGLUMINE 529 MG/ML IV SOLN
25.0000 mL | Freq: Once | INTRAVENOUS | Status: AC | PRN
  Administered 2014-12-20: 25 mL via INTRAVENOUS

## 2014-12-20 MED ORDER — MELOXICAM 15 MG PO TABS: 15.0000 mg | ORAL_TABLET | Freq: Every day | ORAL | Status: DC

## 2014-12-20 MED ORDER — METHYLPREDNISOLONE ACETATE 80 MG/ML IJ SUSP
INTRAMUSCULAR | Status: AC
  Filled 2014-12-20: qty 1

## 2014-12-20 MED ORDER — METHYLPREDNISOLONE ACETATE 80 MG/ML IJ SUSP
80.0000 mg | Freq: Once | INTRAMUSCULAR | Status: AC
  Administered 2014-12-20: 80 mg via INTRAMUSCULAR

## 2014-12-20 MED ORDER — KETOROLAC TROMETHAMINE 30 MG/ML IJ SOLN
INTRAMUSCULAR | Status: AC
  Filled 2014-12-20: qty 1

## 2014-12-20 MED ORDER — HYDROCODONE-ACETAMINOPHEN 5-325 MG PO TABS: ORAL_TABLET | ORAL | Status: DC

## 2014-12-20 MED ORDER — KETOROLAC TROMETHAMINE 60 MG/2ML IM SOLN
30.0000 mg | Freq: Once | INTRAMUSCULAR | Status: AC
  Administered 2014-12-20: 30 mg via INTRAMUSCULAR

## 2014-12-20 MED ORDER — PREDNISONE 20 MG PO TABS: ORAL_TABLET | ORAL | Status: DC

## 2014-12-20 MED ORDER — TIZANIDINE HCL 4 MG PO CAPS: 4.0000 mg | ORAL_CAPSULE | Freq: Three times a day (TID) | ORAL | Status: DC | PRN

## 2014-12-20 NOTE — ED Provider Notes (Signed)
Chief Complaint   Shoulder Pain   History of Present Illness   Stacy Hernandez is a 59 year old female who's had a four-day history of pain that began the right side of her neck and is radiating towards her right shoulder but not down the arm. She denies any injury. There's been no swelling, erythema, or heat. There is no numbness, tingling, weakness in the arm, no swelling of the arm. She denies any fever, chills, headache, sore throat, chest pain, or shortness of breath. There is pain with movement of the neck, not so much with movement of the shoulder. She had a cardiac cath on February 8 involving the right arm as access site for the cath. The patient had an MRI of her heart this morning. She was recently treated for upper respiratory symptoms with a Z-Pak.  Review of Systems   Other than noted above, the patient denies any of the following symptoms: Constitutional:  No fever or chills. Neck:  No swelling, or adenopathy.   Cardiac:  No chest pain, tightness, or pressure. Respiratory:  No cough or dyspnea. M-S:  No joint pain, muscle pain, or back problems. Neuro:  No headache, muscle weakness, or paresthesias.  [place]   Past medical history, family history, social history, meds, and allergies were reviewed.    Physical Examination    Vital signs:  BP 131/67 mmHg  Pulse 79  Temp(Src) 98.1 F (36.7 C) (Oral)  Resp 18  Ht 5\' 2"  (1.575 m)  Wt 169 lb (76.658 kg)  BMI 30.90 kg/m2  SpO2 99% General:  Alert, oriented and in no distress. ENT:  Pharynx clear, no oral lesions. Neck:  There is no swelling, erythema, heat, or deformity of the neck. She has pain to palpation over her right trapezius ridge. No pain to palpation posteriorly or over the anterior cervical triangle. Her neck has a full range of motion but with pain on movement.  Lungs:  No respiratory distress.  Breath sounds clear and equal bilaterally.  No wheezes, rales or rhonchi. Heart:  Regular rhythm.  No gallops,  murmers, or rubs. Ext:  No upper extremity edema, pulses full.  Full ROM of joints with no joint or muscle pain to palpation. Her shoulder had a full range of motion with no pain. Impingement signs are negative. Neuro:  Alert and oriented times 3.  No focal muscle weakness.  DTRs symmetric.  Sensation intact to light touch. Skin: Clear, warm and dry.  No rash.  Good capillary refill.  Course in [facility] [place]   The following medications were given:  Medications  methylPREDNISolone acetate (DEPO-MEDROL) injection 80 mg (80 mg Intramuscular Given 12/20/14 1335)  ketorolac (TORADOL) injection 30 mg (30 mg Intramuscular Given 12/20/14 1335)   Assessment   The encounter diagnosis was Cervical radiculopathy.  Plan    1.  Meds:  The following meds were prescribed:   Discharge Medication List as of 12/20/2014  1:24 PM    START taking these medications   Details  !! HYDROcodone-acetaminophen (NORCO/VICODIN) 5-325 MG per tablet 1 to 2 tabs every 4 to 6 hours as needed for pain., Print    meloxicam (MOBIC) 15 MG tablet Take 1 tablet (15 mg total) by mouth daily., Starting 12/20/2014, Until Discontinued, Normal    predniSONE (DELTASONE) 20 MG tablet Take 3 daily for 5 days, 2 daily for 5 days, 1 daily for 5 days., Normal    tiZANidine (ZANAFLEX) 4 MG capsule Take 1 capsule (4 mg total) by  mouth 3 (three) times daily as needed for muscle spasms., Starting 12/20/2014, Until Discontinued, Normal     !! - Potential duplicate medications found. Please discuss with provider.      2.  Patient Education/Counseling:  The patient was given appropriate handouts, self care instructions, and instructed in pain control.  Given exercises to do twice daily followed by moist heat.    3.  Follow up:  The patient was told to follow up here if no better in 3 to 4 days, or sooner if becoming worse in any way, and given some red flag symptoms such as worsening pain or new neurological symptoms which would prompt  immediate return.  Follow-up with primary care physicians in the next week.     Harden Mo, MD 12/20/14 [id number]

## 2014-12-20 NOTE — ED Notes (Signed)
Pain in right shoulder and into right neck, started 4 days ago, woke with this pain.  Patient reports having a cardiac cath on 2/8 involving right arm as site for access for cath.  Patient has had ?mri on heart this am.  Patient recently treated for uri and treated with z-pac, her pcp is closed due to weather.

## 2014-12-20 NOTE — Discharge Instructions (Signed)
TREATMENT  °Treatment initially involves the use of ice and medication to help reduce pain and inflammation. It is also important to perform strengthening and stretching exercises and modify activities that worsen symptoms so the injury does not get worse. These exercises may be performed at home or with a therapist. For patients who experience severe symptoms, a soft padded collar may be recommended to be worn around the neck.  °Improving your posture may help reduce symptoms. Posture improvement includes pulling your chin and abdomen in while sitting or standing. If you are sitting, sit in a firm chair with your buttocks against the back of the chair. While sleeping, try replacing your pillow with a small towel rolled to 2 inches in diameter, or use a cervical pillow. Poor sleeping positions delay healing.  ° °MEDICATION  °· If pain medication is necessary, nonsteroidal anti-inflammatory medications, such as aspirin and ibuprofen, or other minor pain relievers, such as acetaminophen, are often recommended. °· Do not take pain medication for 7 days before surgery. °· Prescription pain relievers may be given if deemed necessary by your caregiver. Use only as directed and only as much as you need. ° °HEAT AND COLD:  °· Cold treatment (icing) relieves pain and reduces inflammation. Cold treatment should be applied for 10 to 15 minutes every 2 to 3 hours for inflammation and pain and immediately after any activity that aggravates your symptoms. Use ice packs or an ice massage. °· Heat treatment may be used prior to performing the stretching and strengthening activities prescribed by your caregiver, physical therapist, or athletic trainer. Use a heat pack or a warm soak. ° °SEEK MEDICAL CARE IF:  °· Symptoms get worse or do not improve in 2 weeks despite treatment. °· New, unexplained symptoms develop (drugs used in treatment may produce side effects). ° °EXERCISES °RANGE OF MOTION (ROM) AND STRETCHING EXERCISES -  Cervical Strain and Sprain °These exercises may help you when beginning to rehabilitate your injury. In order to successfully resolve your symptoms, you must improve your posture. These exercises are designed to help reduce the forward-head and rounded-shoulder posture which contributes to this condition. Your symptoms may resolve with or without further involvement from your physician, physical therapist or athletic trainer. While completing these exercises, remember:  °· Restoring tissue flexibility helps normal motion to return to the joints. This allows healthier, less painful movement and activity. °· An effective stretch should be held for at least 20 seconds, although you may need to begin with shorter hold times for comfort. °· A stretch should never be painful. You should only feel a gentle lengthening or release in the stretched tissue. ° °STRETCH- Axial Extensors °· Lie on your back on the floor. You may bend your knees for comfort. Place a rolled up hand towel or dish towel, about 2 inches in diameter, under the part of your head that makes contact with the floor. °· Gently tuck your chin, as if trying to make a "double chin," until you feel a gentle stretch at the base of your head. °· Hold _____10_____ seconds. °Repeat _____10_____ times. Complete this exercise _____2_____ times per day.  ° °STRETECH - Axial Extension  °· Stand or sit on a firm surface. Assume a good posture: chest up, shoulders drawn back, abdominal muscles slightly tense, knees unlocked (if standing) and feet hip width apart. °· Slowly retract your chin so your head slides back and your chin slightly lowers.Continue to look straight ahead. °· You should feel a gentle stretch   in the back of your head. Be certain not to feel an aggressive stretch since this can cause headaches later. °· Hold for ____10______ seconds. °Repeat _____10_____ times. Complete this exercise ____2______ times per day. ° °STRETCH  Cervical Side Bend  °· Stand  or sit on a firm surface. Assume a good posture: chest up, shoulders drawn back, abdominal muscles slightly tense, knees unlocked (if standing) and feet hip width apart. °· Without letting your nose or shoulders move, slowly tip your right / left ear to your shoulder until your feel a gentle stretch in the muscles on the opposite side of your neck. °· Hold _____10_____ seconds. °Repeat _____10_____ times. Complete this exercise _____2_____ times per day. ° °STRETCH  Cervical Rotators  °· Stand or sit on a firm surface. Assume a good posture: chest up, shoulders drawn back, abdominal muscles slightly tense, knees unlocked (if standing) and feet hip width apart. °· Keeping your eyes level with the ground, slowly turn your head until you feel a gentle stretch along the back and opposite side of your neck. °· Hold _____10_____ seconds. °Repeat ____10______ times. Complete this exercise ____2______ times per day. ° °RANGE OF MOTION - Neck Circles  °· Stand or sit on a firm surface. Assume a good posture: chest up, shoulders drawn back, abdominal muscles slightly tense, knees unlocked (if standing) and feet hip width apart. °· Gently roll your head down and around from the back of one shoulder to the back of the other. The motion should never be forced or painful. °· Repeat the motion 10-20 times, or until you feel the neck muscles relax and loosen. °Repeat ____10______ times. Complete the exercise _____2_____ times per day. ° °STRENGTHENING EXERCISES - Cervical Strain and Sprain °These exercises may help you when beginning to rehabilitate your injury. They may resolve your symptoms with or without further involvement from your physician, physical therapist or athletic trainer. While completing these exercises, remember:  °· Muscles can gain both the endurance and the strength needed for everyday activities through controlled exercises. °· Complete these exercises as instructed by your physician, physical therapist or  athletic trainer. Progress the resistance and repetitions only as guided. °· You may experience muscle soreness or fatigue, but the pain or discomfort you are trying to eliminate should never worsen during these exercises. If this pain does worsen, stop and make certain you are following the directions exactly. If the pain is still present after adjustments, discontinue the exercise until you can discuss the trouble with your clinician. ° °STRENGTH Cervical Flexors, Isometric °· Face a wall, standing about 6 inches away. Place a small pillow, a ball about 6-8 inches in diameter, or a folded towel between your forehead and the wall. °· Slightly tuck your chin and gently push your forehead into the soft object. Push only with mild to moderate intensity, building up tension gradually. Keep your jaw and forehead relaxed. °· Hold 10 to 20 seconds. Keep your breathing relaxed. °· Release the tension slowly. Relax your neck muscles completely before you start the next repetition. °Repeat _____10_____ times. Complete this exercise _____2_____ times per day. ° °STRENGTH- Cervical Lateral Flexors, Isometric  °· Stand about 6 inches away from a wall. Place a small pillow, a ball about 6-8 inches in diameter, or a folded towel between the side of your head and the wall. °· Slightly tuck your chin and gently tilt your head into the soft object. Push only with mild to moderate intensity, building up tension gradually. Keep   your jaw and forehead relaxed. °· Hold 10 to 20 seconds. Keep your breathing relaxed. °· Release the tension slowly. Relax your neck muscles completely before you start the next repetition. °Repeat _____10_____ times. Complete this exercise ____2______ times per day. ° °STRENGTH  Cervical Extensors, Isometric  °· Stand about 6 inches away from a wall. Place a small pillow, a ball about 6-8 inches in diameter, or a folded towel between the back of your head and the wall. °· Slightly tuck your chin and gently  tilt your head back into the soft object. Push only with mild to moderate intensity, building up tension gradually. Keep your jaw and forehead relaxed. °· Hold 10 to 20 seconds. Keep your breathing relaxed. °· Release the tension slowly. Relax your neck muscles completely before you start the next repetition. °Repeat _____10_____ times. Complete this exercise _____2_____ times per day. ° °POSTURE AND BODY MECHANICS CONSIDERATIONS - Cervical Strain and Sprain °Keeping correct posture when sitting, standing or completing your activities will reduce the stress put on different body tissues, allowing injured tissues a chance to heal and limiting painful experiences. The following are general guidelines for improved posture. Your physician or physical therapist will provide you with any instructions specific to your needs. While reading these guidelines, remember: °· The exercises prescribed by your provider will help you have the flexibility and strength to maintain correct postures. °· The correct posture provides the optimal environment for your joints to work. All of your joints have less wear and tear when properly supported by a spine with good posture. This means you will experience a healthier, less painful body. °· Correct posture must be practiced with all of your activities, especially prolonged sitting and standing. Correct posture is as important when doing repetitive low-stress activities (typing) as it is when doing a single heavy-load activity (lifting). °PROLONGED STANDING WHILE SLIGHTLY LEANING FORWARD °When completing a task that requires you to lean forward while standing in one place for a long time, place either foot up on a stationary 2-4 inch high object to help maintain the best posture. When both feet are on the ground, the low back tends to lose its slight inward curve. If this curve flattens (or becomes too large), then the back and your other joints will experience too much stress, fatigue  more quickly and can cause pain.  °RESTING POSITIONS °Consider which positions are most painful for you when choosing a resting position. If you have pain with flexion-based activities (sitting, bending, stooping, squatting), choose a position that allows you to rest in a less flexed posture. You would want to avoid curling into a fetal position on your side. If your pain worsens with extension-based activities (prolonged standing, working overhead), avoid resting in an extended position such as sleeping on your stomach. Most people will find more comfort when they rest with their spine in a more neutral position, neither too rounded nor too arched. Lying on a non-sagging bed on your side with a pillow between your knees, or on your back with a pillow under your knees will often provide some relief. Keep in mind, being in any one position for a prolonged period of time, no matter how correct your posture, can still lead to stiffness. °WALKING °Walk with an upright posture. Your ears, shoulders and hips should all line-up. °OFFICE WORK °When working at a desk, create an environment that supports good, upright posture. Without extra support, muscles fatigue and lead to excessive strain on joints and other tissues. °  CHAIR: °· A chair should be able to slide under your desk when your back makes contact with the back of the chair. This allows you to work closely. °· The chair's height should allow your eyes to be level with the upper part of your monitor and your hands to be slightly lower than your elbows. °· Body position: °· Your feet should make contact with the floor. If this is not possible, use a foot rest. °· Keep your ears over your shoulders. This will reduce stress on your neck and low back. °Document Released: 10/22/2005 Document Revised: 01/14/2012 Document Reviewed: 02/03/2009 °[employer]® Patient Information ©2013 [employer]. ° ° °

## 2014-12-27 ENCOUNTER — Telehealth (HOSPITAL_COMMUNITY): Payer: Self-pay | Admitting: Vascular Surgery

## 2014-12-27 NOTE — Telephone Encounter (Signed)
Stacy Hernandez was denied we are in the process of filing an appeal, pt is aware, samples left for her at the front desk she will pick them up today

## 2014-12-27 NOTE — Telephone Encounter (Signed)
Pt called she is out of her samples of Entrust[employer], pt wants to know about the auth and would she be able to get more samples.. Please advise

## 2015-01-07 ENCOUNTER — Encounter (HOSPITAL_COMMUNITY): Payer: Self-pay | Admitting: *Deleted

## 2015-01-11 ENCOUNTER — Telehealth (HOSPITAL_COMMUNITY): Payer: Self-pay | Admitting: *Deleted

## 2015-01-11 NOTE — Telephone Encounter (Signed)
Received fax from [place] pt's Stacy Hernandez has been approved through 01/06/17, pt is aware

## 2015-01-12 ENCOUNTER — Encounter (HOSPITAL_COMMUNITY): Payer: Self-pay

## 2015-01-12 ENCOUNTER — Ambulatory Visit (HOSPITAL_COMMUNITY)
Admission: RE | Admit: 2015-01-12 | Discharge: 2015-01-12 | Disposition: A | Discharge status: Home / Self Care | Payer: 59 | Source: Ambulatory Visit | Attending: Internal Medicine | Admitting: Internal Medicine

## 2015-01-12 VITALS — BP 118/70 | HR 73 | Wt 166.8 lb

## 2015-01-12 DIAGNOSIS — E039 Hypothyroidism, unspecified: Secondary | ICD-10-CM | POA: Diagnosis not present

## 2015-01-12 DIAGNOSIS — I10 Essential (primary) hypertension: Secondary | ICD-10-CM | POA: Diagnosis not present

## 2015-01-12 DIAGNOSIS — Z79891 Long term (current) use of opiate analgesic: Secondary | ICD-10-CM | POA: Insufficient documentation

## 2015-01-12 DIAGNOSIS — Z7952 Long term (current) use of systemic steroids: Secondary | ICD-10-CM | POA: Insufficient documentation

## 2015-01-12 DIAGNOSIS — I5022 Chronic systolic (congestive) heart failure: Secondary | ICD-10-CM

## 2015-01-12 DIAGNOSIS — Z7982 Long term (current) use of aspirin: Secondary | ICD-10-CM | POA: Insufficient documentation

## 2015-01-12 DIAGNOSIS — E785 Hyperlipidemia, unspecified: Secondary | ICD-10-CM | POA: Diagnosis not present

## 2015-01-12 DIAGNOSIS — I447 Left bundle-branch block, unspecified: Secondary | ICD-10-CM | POA: Insufficient documentation

## 2015-01-12 DIAGNOSIS — Z79899 Other long term (current) drug therapy: Secondary | ICD-10-CM | POA: Insufficient documentation

## 2015-01-12 DIAGNOSIS — Z87891 Personal history of nicotine dependence: Secondary | ICD-10-CM | POA: Insufficient documentation

## 2015-01-12 DIAGNOSIS — I429 Cardiomyopathy, unspecified: Secondary | ICD-10-CM | POA: Diagnosis not present

## 2015-01-12 NOTE — Patient Instructions (Signed)
Your physician recommends that you schedule a follow-up appointment in: 4 weeks  Do the following things EVERYDAY: 1) Weigh yourself in the morning before breakfast. Write it down and keep it in a log. 2) Take your medicines as prescribed 3) Eat low salt foods-Limit salt (sodium) to 2000 mg per day.  4) Stay as active as you can everyday 5) Limit all fluids for the day to less than 2 liters 6)

## 2015-01-12 NOTE — Progress Notes (Signed)
Patient ID: Stacy Hernandez, female   DOB: Jul 25, 1956, 59 y.o.   MRN: [id number] PCP: Dr. Shelia Media  59 yo with history of HTN, intermittent LBBB, and nonischemic cardiomyopathy presents for cardiology followup.  She was admitted with chest pain in 6/13.  ECG showed intermittent LBBB.  Echo showed EF 45-50%.  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    Developed CP and dyspnea. She was set up for echo and Cardiolite in 12/15.  Echo showed EF 10-15% (significant fall).  She had a Lexiscan Cardiolite with EF 25% and apical anteroseptal and apical fixed perfusion defect.  Therefore, she was sent for left and right heart catheterization in 1/16.  This showed mild elevation in LV and RV filling pressures and no significant CAD. She was started on Lasix 20 mg daily.   She returns for follow up. Complaining of excessive sweating over the last 4 weeks. Quit smoking in January. Every day having dyspnea at rest and with exertion. Denies orthopnea/PND/dizziness. Mild dyspnea with inclines. Having fatigue. Weight at home 166-71 pounds.  Taking all medications.  Out of work due to neck injury.    Labs (12/13): LDL 80, HDL 46, K 4, creatinine 0.7 Labs (6/14): K 4, creatinine 0.8, LDL 73, HDL 45 Labs (12/15): K 4.2, creatinine 0.7, HCT 43.3, LDL 60, HDL 51, BNP 577, TSH normal Labs (2/16): K 4.2, creatinine 0.7 Labs 11/25/2014: SPEP no M Spike K 4.3 Creatinine 0.74   PMH: 1. HTN 2. Hyperlipidemia 3. Prior smoker 4. Intermittent LBBB/IVCD 5. Hypothyroidism 6. Cardiomyopathy: Nonischemic.  Echo (6/13) with EF 45-50%, paradoxical septal motion.  LHC (6/13) with 40% D1 stenosis, 30% PLV stenosis.  Echo (12/15) with EF 10-15%, moderately dilated LV, diffuse hypokinesis, moderate MR.   Lexiscan Cardiolite (12/15) with EF 25%, diffuse hypokinesis, apical anteroseptal and apical fixed defect. LHC/RHC (1/16) with mean RA 8, PA 37/19, mean PCWP 19, CI 2.63, EF 30%, no significant CAD.  7. Cardiac MRI 12/20/2014: EF 24% Mod  mitral regurg thought to be from dilated LV. 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  Quit smoking in 1/16.    FH: Father CABG at 50   ROS: All systems reviewed and negative except as per HPI.   Current Outpatient Prescriptions  Medication Sig Dispense Refill  . aspirin 81 MG tablet Take 81 mg by mouth daily.    Marland Kitchen atorvastatin (LIPITOR) 10 MG tablet TAKE 1 TABLET BY MOUTH AT BEDTIME 30 tablet 6  . carvedilol (COREG) 6.25 MG tablet Take 1 tablet (6.25 mg total) by mouth 2 (two) times daily with a meal. 60 tablet 3  . Cholecalciferol (VITAMIN D-3 PO) Take 2,000 Units by mouth daily.     . furosemide (LASIX) 20 MG tablet Take 20 mg by mouth daily.    Marland Kitchen HYDROcodone-acetaminophen (NORCO) 5-325 MG per tablet Take 1 tablet by mouth every 6 (six) hours as needed.    . hydroxychloroquine (PLAQUENIL) 200 MG tablet Take 200 mg by mouth 2 (two) times daily.  5  . Inositol Niacinate (NIACIN FLUSH FREE) 500 MG CAPS Take 3 capsules by mouth daily.    Marland Kitchen levothyroxine (SYNTHROID, LEVOTHROID) 100 MCG tablet Take 100 mcg by mouth daily before breakfast.    . Multiple Vitamins-Minerals (MULTIVITAMIN WITH MINERALS) tablet Take 1 tablet by mouth daily.    . nitroGLYCERIN (NITROSTAT) 0.4 MG SL tablet Place 1 tablet (0.4 mg total) under the tongue every 5 (five) minutes  as needed for chest pain. 25 tablet 3  . potassium chloride (K-DUR) 10 MEQ tablet Take 10 mEq by mouth daily.    . predniSONE (DELTASONE) 20 MG tablet Take 3 daily for 5 days, 2 daily for 5 days, 1 daily for 5 days. 30 tablet 0  . sacubitril-valsartan (ENTRESTO) 49-51 MG Take 1 tablet by mouth 2 (two) times daily. 60 tablet 3  . tiZANidine (ZANAFLEX) 4 MG capsule Take 1 capsule (4 mg total) by mouth 3 (three) times daily as needed for muscle spasms. 30 capsule 0   No current facility-administered medications for this encounter.    BP 118/70 mmHg  Pulse 73  Wt 166 lb 12 oz (75.637 kg)   SpO2 98% General: NAD Neck: No JVD, no thyromegaly or thyroid nodule.  Lungs: Clear to auscultation bilaterally with normal respiratory effort. CV: Nondisplaced PMI.  Heart regular S1/S2, no S3/S4, 1/6 SEM.  No peripheral edema.  No carotid bruit.  Normal pedal pulses.  Abdomen: Soft, nontender, no hepatosplenomegaly, no distention.  Neurologic: Alert and oriented x 3.  Psych: Normal affect. Extremities: No clubbing or cyanosis.   Assessment/Plan: 1. Cardiomyopathy: Nonischemic cardiomyopathy. In the past, she had EF 45-50% with no significant CAD (2013).  I thought that she had a mild LBBB-related nonischemic cardiomyopathy.  However, in the fall of 2015 she developed exertional chest pain and dyspnea.  Echo and Cardiolite showed marked worsening of her LV systolic function (EF 32-67% by echo) and Cardiolite suggested possible prior MI.  Left heart cath in 1/16 showed no significant CAD.  RHC showed mildly elevated right and left heart filling pressures and normal CI. No M spike noted.   - Much improved NYAH II-III. Volume status and labs look good. Lasix 20 mg daily.  - Continue Coreg 6.25 bid will not increase due to fatigue.    - Continue Entresto 49/51 mg bid.   - Cardiac MRI without evidence infiltrative disease EF 24% . - Echo in 5/16 (6 months medical therapy) to decide on need for CRT-D (LBBB-like IVCD).   2. Smoking: Quit January 2016. .    Follow up in 4 weeks.  CLEGG,AMY NP-C  01/12/2015  Patient seen and examined with Darrick Grinder, NP. We discussed all aspects of the encounter. I agree with the assessment and plan as stated above.   She is doing fairly well although does have some fatigue. Will continue current regimen for now. Hope to increase Entresto at next visit. Will need to decide on ICD soon.   Benay Spice 4:32 PM

## 2015-01-17 NOTE — Telephone Encounter (Signed)
entresto approved see phone note on 01/11/15

## 2015-02-09 ENCOUNTER — Ambulatory Visit (HOSPITAL_COMMUNITY)
Admission: RE | Admit: 2015-02-09 | Discharge: 2015-02-09 | Disposition: A | Discharge status: Home / Self Care | Payer: 59 | Source: Ambulatory Visit | Attending: Cardiology | Admitting: Cardiology

## 2015-02-09 VITALS — BP 96/62 | HR 77 | Wt 167.8 lb

## 2015-02-09 DIAGNOSIS — Z87891 Personal history of nicotine dependence: Secondary | ICD-10-CM | POA: Insufficient documentation

## 2015-02-09 DIAGNOSIS — J90 Pleural effusion, not elsewhere classified: Secondary | ICD-10-CM

## 2015-02-09 DIAGNOSIS — I5022 Chronic systolic (congestive) heart failure: Secondary | ICD-10-CM

## 2015-02-09 DIAGNOSIS — G4733 Obstructive sleep apnea (adult) (pediatric): Secondary | ICD-10-CM | POA: Diagnosis not present

## 2015-02-09 DIAGNOSIS — Z79899 Other long term (current) drug therapy: Secondary | ICD-10-CM | POA: Diagnosis not present

## 2015-02-09 DIAGNOSIS — I10 Essential (primary) hypertension: Secondary | ICD-10-CM | POA: Insufficient documentation

## 2015-02-09 DIAGNOSIS — E785 Hyperlipidemia, unspecified: Secondary | ICD-10-CM | POA: Insufficient documentation

## 2015-02-09 DIAGNOSIS — Z720 Tobacco use: Secondary | ICD-10-CM

## 2015-02-09 DIAGNOSIS — I429 Cardiomyopathy, unspecified: Secondary | ICD-10-CM | POA: Diagnosis present

## 2015-02-09 DIAGNOSIS — Z7982 Long term (current) use of aspirin: Secondary | ICD-10-CM | POA: Diagnosis not present

## 2015-02-09 DIAGNOSIS — J449 Chronic obstructive pulmonary disease, unspecified: Secondary | ICD-10-CM

## 2015-02-09 DIAGNOSIS — E039 Hypothyroidism, unspecified: Secondary | ICD-10-CM | POA: Diagnosis not present

## 2015-02-09 DIAGNOSIS — I252 Old myocardial infarction: Secondary | ICD-10-CM | POA: Diagnosis not present

## 2015-02-09 DIAGNOSIS — R0683 Snoring: Secondary | ICD-10-CM

## 2015-02-09 LAB — BASIC METABOLIC PANEL
Anion gap: 6 (ref 5–15)
BUN: 17 mg/dL (ref 6–23)
CO2: 31 mmol/L (ref 19–32)
Calcium: 10.1 mg/dL (ref 8.4–10.5)
Chloride: 105 mmol/L (ref 96–112)
Creatinine, Ser: 0.86 mg/dL (ref 0.50–1.10)
GFR calc Af Amer: 85 mL/min — ABNORMAL LOW (ref >90)
GFR calc non Af Amer: 73 mL/min — ABNORMAL LOW (ref >90)
Glucose, Bld: 84 mg/dL (ref 70–99)
Potassium: 4.4 mmol/L (ref 3.5–5.1)
Sodium: 142 mmol/L (ref 135–145)

## 2015-02-09 LAB — BRAIN NATRIURETIC PEPTIDE: B Natriuretic Peptide: 339.1 pg/mL — ABNORMAL HIGH (ref 0.0–100.0)

## 2015-02-09 MED ORDER — POTASSIUM CHLORIDE ER 10 MEQ PO TBCR: 20.0000 meq | EXTENDED_RELEASE_TABLET | Freq: Every day | ORAL | Status: DC

## 2015-02-09 MED ORDER — FUROSEMIDE 20 MG PO TABS: 40.0000 mg | ORAL_TABLET | Freq: Every day | ORAL | Status: DC

## 2015-02-09 NOTE — Patient Instructions (Signed)
Increase Furosemide (Lasix) to 40 mg (2 tabs) daily  Increase Potassium to 20 meq (2 tabs) daily  Labs today  Lab in 2 weeks (bmet)  A chest x-ray takes a picture of the organs and structures inside the chest, including the heart, lungs, and blood vessels. This test can show several things, including, whether the heart is enlarges; whether fluid is building up in the lungs; and whether pacemaker / defibrillator leads are still in place.  Your physician has recommended that you have a sleep study. This test records several body functions during sleep, including: brain activity, eye movement, oxygen and carbon dioxide blood levels, heart rate and rhythm, breathing rate and rhythm, the flow of air through your mouth and nose, snoring, body muscle movements, and chest and belly movement.  Your physician has recommended that you have a pulmonary function test. Pulmonary Function Tests are a group of tests that measure how well air moves in and out of your lungs.  Your physician recommends that you schedule a follow-up appointment in: 1 month with an echocardiogram

## 2015-02-10 DIAGNOSIS — G4733 Obstructive sleep apnea (adult) (pediatric): Secondary | ICD-10-CM | POA: Insufficient documentation

## 2015-02-10 NOTE — Progress Notes (Signed)
Patient ID: Stacy Hernandez, female   DOB: 24-Nov-1955, 59 y.o.   MRN: [id number] PCP: Dr. Shelia Media  59 yo with history of HTN, intermittent LBBB, and nonischemic cardiomyopathy presents for cardiology followup.  She was admitted with chest pain in 6/13.  ECG showed intermittent LBBB.  Echo showed EF 45-50%.  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    Developed CP and dyspnea. She was set up for echo and Cardiolite in 12/15.  Echo showed EF 10-15% (significant fall).  She had a Lexiscan Cardiolite with EF 25% and apical anteroseptal and apical fixed perfusion defect.  Therefore, she was sent for left and right heart catheterization in 1/16.  This showed mild elevation in LV and RV filling pressures and no significant CAD.  She was started on Lasix 20 mg daily.   Since I saw her last, she injured her neck and is out of work.  She remains off cigarettes.  She is short of breath walking 50-100 yards.  No chest pain, no orthopnea/PND.  No lightheadedness.  +Generalized fatigue.  Sleepy during the day.  Weight is stable.   Labs (12/13): LDL 80, HDL 46, K 4, creatinine 0.7 Labs (6/14): K 4, creatinine 0.8, LDL 73, HDL 45 Labs (12/15): K 4.2, creatinine 0.7, HCT 43.3, LDL 60, HDL 51, BNP 577, TSH normal Labs (2/16): K 4.2, creatinine 0.7 Labs (1/16): SPEP no M spike K 4.3 Creatinine 0.74  Labs (2/16): K 4.2, creatinine 0.78  PMH: 1. HTN 2. Hyperlipidemia 3. Prior smoker 4. Intermittent LBBB/IVCD 5. Hypothyroidism 6. Cardiomyopathy: Nonischemic.  Echo (6/13) with EF 45-50%, paradoxical septal motion.  LHC (6/13) with 40% D1 stenosis, 30% PLV stenosis.  Echo (12/15) with EF 10-15%, moderately dilated LV, diffuse hypokinesis, moderate MR.   Lexiscan Cardiolite (12/15) with EF 25%, diffuse hypokinesis, apical anteroseptal and apical fixed defect. LHC/RHC (1/16) with mean RA 8, PA 37/19, mean PCWP 19, CI 2.63, EF 30%, no significant CAD.  7. Cardiac MRI 12/20/2014: EF 24% Mod mitral regurg thought to be from  dilated LV. 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  Quit smoking in 1/16.    FH: Father CABG at 35   ROS: All systems reviewed and negative except as per HPI.   Current Outpatient Prescriptions  Medication Sig Dispense Refill  . aspirin 81 MG tablet Take 81 mg by mouth daily.    Marland Kitchen atorvastatin (LIPITOR) 10 MG tablet TAKE 1 TABLET BY MOUTH AT BEDTIME 30 tablet 6  . carvedilol (COREG) 6.25 MG tablet Take 1 tablet (6.25 mg total) by mouth 2 (two) times daily with a meal. 60 tablet 3  . Cholecalciferol (VITAMIN D-3 PO) Take 2,000 Units by mouth daily.     . furosemide (LASIX) 20 MG tablet Take 2 tablets (40 mg total) by mouth daily. 60 tablet 3  . HYDROcodone-acetaminophen (NORCO) 5-325 MG per tablet Take 1 tablet by mouth every 6 (six) hours as needed.    . hydroxychloroquine (PLAQUENIL) 200 MG tablet Take 200 mg by mouth 2 (two) times daily.  5  . Inositol Niacinate (NIACIN FLUSH FREE) 500 MG CAPS Take 3 capsules by mouth daily.    Marland Kitchen levothyroxine (SYNTHROID, LEVOTHROID) 100 MCG tablet Take 100 mcg by mouth daily before breakfast.    . Multiple Vitamins-Minerals (MULTIVITAMIN WITH MINERALS) tablet Take 1 tablet by mouth daily.    . nitroGLYCERIN (NITROSTAT) 0.4 MG SL tablet Place 1 tablet (0.4 mg total)  under the tongue every 5 (five) minutes as needed for chest pain. 25 tablet 3  . potassium chloride (K-DUR) 10 MEQ tablet Take 2 tablets (20 mEq total) by mouth daily. 60 tablet 3  . sacubitril-valsartan (ENTRESTO) 49-51 MG Take 1 tablet by mouth 2 (two) times daily. 60 tablet 3   No current facility-administered medications for this encounter.    BP 96/62 mmHg  Pulse 77  Wt 167 lb 12.8 oz (76.114 kg)  SpO2 96% General: NAD Neck: JVP 8 cm, no thyromegaly or thyroid nodule.  Lungs: Decreased breath sounds/rhonchi right base.  CV: Nondisplaced PMI.  Heart regular S1/S2, no S3/S4, 1/6 SEM.  No peripheral edema.  No carotid bruit.   Normal pedal pulses.  Abdomen: Soft, nontender, no hepatosplenomegaly, mild distention.  Neurologic: Alert and oriented x 3.  Psych: Normal affect. Extremities: No clubbing or cyanosis.   Assessment/Plan: 1. Cardiomyopathy: Nonischemic cardiomyopathy. In the past, she had EF 45-50% with no significant CAD (2013).  I thought that she had a mild LBBB-related nonischemic cardiomyopathy.  However, in the fall of 2015 she developed exertional chest pain and dyspnea.  Echo and Cardiolite showed marked worsening of her LV systolic function (EF 58-83% by echo) and Cardiolite suggested possible prior MI.  Left heart cath in 1/16 showed no significant CAD.  RHC showed mildly elevated right and left heart filling pressures and normal CI. SPEP negative and no infiltrative disease on cardiac MRI.  Currently, NYHA class II-III symptoms.  She is not markedly volume overloaded on exam despite ongoing significant symptoms.    - Increase Lasix to 40 mg daily and KCl to 20 daily.  BMET/BNP today and repeat BMET in 2 wks.  - Continue Coreg 6.25 bid will not increase due to fatigue.    - Continue Entresto 49/51 mg bid, will not increase with soft BP.   - Echo in 5/16 (6 months medical therapy) to decide on need for CRT-D (LBBB-like IVCD) => followup in office in 1 month with the echo.    2. COPD: I am concerned that some of her dyspnea may be due to COPD rather than CHF. She was a long-time smoker.  I will arrange for PFTs.  3. OSA: She is sleepy and fatigued during the day.  I will arrange for sleep study.   4. Decreased breath sounds right base: I will get a CXR to look for pleural effusion.   Loralie Champagne 02/10/2015

## 2015-02-11 ENCOUNTER — Telehealth (HOSPITAL_COMMUNITY): Payer: Self-pay | Admitting: *Deleted

## 2015-02-11 MED ORDER — DOXYCYCLINE HYCLATE 100 MG PO CAPS: 100.0000 mg | ORAL_CAPSULE | Freq: Two times a day (BID) | ORAL | Status: AC

## 2015-02-11 NOTE — Telephone Encounter (Signed)
-----   Message from Larey Dresser, MD sent at 02/09/2015 10:21 PM EDT ----- ? Right basilar pneumonia.  Would treat with doxycycline 100 mg bid x 10 days.  Will need repeat CXR in about 4 weeks to see if right lung looks better.  If not, may need CT scan.

## 2015-02-11 NOTE — Telephone Encounter (Signed)
Pt aware, rx sent in, repeat x-ray 5/4 at [facility]

## 2015-02-16 ENCOUNTER — Ambulatory Visit (HOSPITAL_COMMUNITY)
Admission: RE | Admit: 2015-02-16 | Discharge: 2015-02-16 | Disposition: A | Discharge status: Home / Self Care | Payer: 59 | Source: Ambulatory Visit | Attending: Cardiology | Admitting: Cardiology

## 2015-02-16 DIAGNOSIS — J449 Chronic obstructive pulmonary disease, unspecified: Secondary | ICD-10-CM

## 2015-02-16 LAB — PULMONARY FUNCTION TEST
DL/VA % pred: 85 %
DL/VA: 3.77 ml/min/mmHg/L
DLCO unc % pred: 32 %
DLCO unc: 6.57 ml/min/mmHg
FEF 25-75 Post: 1.05 L/sec
FEF 25-75 Pre: 0.67 L/sec
FEF2575-%Change-Post: 57 %
FEF2575-%Pred-Post: 46 %
FEF2575-%Pred-Pre: 29 %
FEV1-%Change-Post: 13 %
FEV1-%Pred-Post: 41 %
FEV1-%Pred-Pre: 36 %
FEV1-Post: 0.97 L
FEV1-Pre: 0.85 L
FEV1FVC-%Change-Post: 1 %
FEV1FVC-%Pred-Pre: 97 %
FEV6-%Change-Post: 11 %
FEV6-%Pred-Post: 43 %
FEV6-%Pred-Pre: 38 %
FEV6-Post: 1.25 L
FEV6-Pre: 1.12 L
FEV6FVC-%Pred-Post: 103 %
FEV6FVC-%Pred-Pre: 103 %
FVC-%Change-Post: 11 %
FVC-%Pred-Post: 41 %
FVC-%Pred-Pre: 37 %
FVC-Post: 1.25 L
FVC-Pre: 1.12 L
Post FEV1/FVC ratio: 78 %
Post FEV6/FVC ratio: 100 %
Pre FEV1/FVC ratio: 76 %
Pre FEV6/FVC Ratio: 100 %
RV % pred: 101 %
RV: 1.84 L
TLC % pred: 67 %
TLC: 3.12 L

## 2015-02-16 MED ORDER — ALBUTEROL SULFATE (2.5 MG/3ML) 0.083% IN NEBU
2.5000 mg | INHALATION_SOLUTION | Freq: Once | RESPIRATORY_TRACT | Status: AC
  Administered 2015-02-16: 2.5 mg via RESPIRATORY_TRACT

## 2015-02-21 ENCOUNTER — Other Ambulatory Visit (HOSPITAL_COMMUNITY): Payer: Self-pay | Admitting: Cardiology

## 2015-02-23 ENCOUNTER — Ambulatory Visit (HOSPITAL_COMMUNITY)
Admission: RE | Admit: 2015-02-23 | Discharge: 2015-02-23 | Disposition: A | Discharge status: Home / Self Care | Payer: 59 | Source: Ambulatory Visit | Attending: Internal Medicine | Admitting: Internal Medicine

## 2015-02-23 DIAGNOSIS — I5022 Chronic systolic (congestive) heart failure: Secondary | ICD-10-CM | POA: Diagnosis not present

## 2015-02-23 DIAGNOSIS — J449 Chronic obstructive pulmonary disease, unspecified: Secondary | ICD-10-CM

## 2015-02-23 LAB — BASIC METABOLIC PANEL
Anion gap: 7 (ref 5–15)
BUN: 19 mg/dL (ref 6–23)
CO2: 29 mmol/L (ref 19–32)
Calcium: 10.1 mg/dL (ref 8.4–10.5)
Chloride: 106 mmol/L (ref 96–112)
Creatinine, Ser: 0.9 mg/dL (ref 0.50–1.10)
GFR calc Af Amer: 80 mL/min — ABNORMAL LOW (ref >90)
GFR calc non Af Amer: 69 mL/min — ABNORMAL LOW (ref >90)
Glucose, Bld: 84 mg/dL (ref 70–99)
Potassium: 4.2 mmol/L (ref 3.5–5.1)
Sodium: 142 mmol/L (ref 135–145)

## 2015-03-08 ENCOUNTER — Other Ambulatory Visit: Payer: Self-pay | Admitting: Cardiology

## 2015-03-09 ENCOUNTER — Ambulatory Visit (HOSPITAL_BASED_OUTPATIENT_CLINIC_OR_DEPARTMENT_OTHER)
Admission: RE | Admit: 2015-03-09 | Discharge: 2015-03-09 | Disposition: A | Discharge status: Home / Self Care | Payer: 59 | Source: Ambulatory Visit

## 2015-03-09 ENCOUNTER — Ambulatory Visit (HOSPITAL_COMMUNITY)
Admission: RE | Admit: 2015-03-09 | Discharge: 2015-03-09 | Disposition: A | Discharge status: Home / Self Care | Payer: 59 | Source: Ambulatory Visit | Attending: Internal Medicine | Admitting: Internal Medicine

## 2015-03-09 ENCOUNTER — Encounter (HOSPITAL_COMMUNITY): Payer: Self-pay

## 2015-03-09 VITALS — BP 130/82 | HR 82 | Wt 166.1 lb

## 2015-03-09 DIAGNOSIS — J9611 Chronic respiratory failure with hypoxia: Secondary | ICD-10-CM | POA: Diagnosis not present

## 2015-03-09 DIAGNOSIS — I5022 Chronic systolic (congestive) heart failure: Secondary | ICD-10-CM

## 2015-03-09 DIAGNOSIS — J449 Chronic obstructive pulmonary disease, unspecified: Secondary | ICD-10-CM | POA: Insufficient documentation

## 2015-03-09 DIAGNOSIS — I429 Cardiomyopathy, unspecified: Secondary | ICD-10-CM | POA: Diagnosis not present

## 2015-03-09 DIAGNOSIS — G4733 Obstructive sleep apnea (adult) (pediatric): Secondary | ICD-10-CM | POA: Insufficient documentation

## 2015-03-09 DIAGNOSIS — E039 Hypothyroidism, unspecified: Secondary | ICD-10-CM | POA: Diagnosis not present

## 2015-03-09 DIAGNOSIS — J9610 Chronic respiratory failure, unspecified whether with hypoxia or hypercapnia: Secondary | ICD-10-CM | POA: Insufficient documentation

## 2015-03-09 DIAGNOSIS — Z7982 Long term (current) use of aspirin: Secondary | ICD-10-CM | POA: Insufficient documentation

## 2015-03-09 DIAGNOSIS — E785 Hyperlipidemia, unspecified: Secondary | ICD-10-CM | POA: Diagnosis not present

## 2015-03-09 DIAGNOSIS — I447 Left bundle-branch block, unspecified: Secondary | ICD-10-CM | POA: Insufficient documentation

## 2015-03-09 DIAGNOSIS — Z79899 Other long term (current) drug therapy: Secondary | ICD-10-CM | POA: Diagnosis not present

## 2015-03-09 DIAGNOSIS — J438 Other emphysema: Secondary | ICD-10-CM

## 2015-03-09 DIAGNOSIS — I2510 Atherosclerotic heart disease of native coronary artery without angina pectoris: Secondary | ICD-10-CM | POA: Insufficient documentation

## 2015-03-09 DIAGNOSIS — I10 Essential (primary) hypertension: Secondary | ICD-10-CM | POA: Insufficient documentation

## 2015-03-09 MED ORDER — BISOPROLOL FUMARATE 5 MG PO TABS: 5.0000 mg | ORAL_TABLET | Freq: Every day | ORAL | Status: DC

## 2015-03-09 NOTE — Progress Notes (Signed)
Patient ID: Stacy Hernandez, female   DOB: 02-02-1956, 59 y.o.   MRN: [id number] PCP: Dr. Shelia Media  59 yo with history of HTN, intermittent LBBB, and nonischemic cardiomyopathy presents for cardiology followup.  She was admitted with chest pain in 6/13.  ECG showed intermittent LBBB.  Echo showed EF 45-50%.  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    Developed CP and dyspnea. She was set up for echo and Cardiolite in 12/15.  Echo showed EF 10-15% (significant fall).  She had a Lexiscan Cardiolite with EF 25% and apical anteroseptal and apical fixed perfusion defect.  Therefore, she was sent for left and right heart catheterization in 1/16.  This showed mild elevation in LV and RV filling pressures and no significant CAD.  She was started on Lasix 20 mg daily.   She returns for HF follow up. Last visit lasix was increased. Remains SOB with exertion. Sleeping on 1 pillow Weight at home 165-166 pounds.   She remains off cigarettes.  Sleepy during the day.    Labs (12/13): LDL 80, HDL 46, K 4, creatinine 0.7 Labs (6/14): K 4, creatinine 0.8, LDL 73, HDL 45 Labs (12/15): K 4.2, creatinine 0.7, HCT 43.3, LDL 60, HDL 51, BNP 577, TSH normal Labs (2/16): K 4.2, creatinine 0.7 Labs (1/16): SPEP no M spike K 4.3 Creatinine 0.74  Labs (2/16): K 4.2, creatinine 0.78 Labs (4/201/2016) : K 4.2 Creatinine 0.90  PMH: 1. HTN 2. Hyperlipidemia 3. Prior smoker 4. Intermittent LBBB/IVCD 5. Hypothyroidism 6. Cardiomyopathy: Nonischemic.  Echo (6/13) with EF 45-50%, paradoxical septal motion.  LHC (6/13) with 40% D1 stenosis, 30% PLV stenosis.  Echo (12/15) with EF 10-15%, moderately dilated LV, diffuse hypokinesis, moderate MR.   Lexiscan Cardiolite (12/15) with EF 25%, diffuse hypokinesis, apical anteroseptal and apical fixed defect. LHC/RHC (1/16) with mean RA 8, PA 37/19, mean PCWP 19, CI 2.63, EF 30%, no significant CAD.  7. Cardiac MRI 12/20/2014: EF 24% Mod mitral regurg thought to be from dilated LV. 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  Quit smoking in 1/16.    FH: Father CABG at 24   ROS: All systems reviewed and negative except as per HPI.   Current Outpatient Prescriptions  Medication Sig Dispense Refill  . aspirin 81 MG tablet Take 81 mg by mouth daily.    Marland Kitchen atorvastatin (LIPITOR) 10 MG tablet TAKE 1 TABLET BY MOUTH AT BEDTIME 30 tablet 6  . carvedilol (COREG) 6.25 MG tablet TAKE 1 TABLET (6.25 MG TOTAL) BY MOUTH 2 (TWO) TIMES DAILY WITH A MEAL. 60 tablet 2  . Cholecalciferol (VITAMIN D-3 PO) Take 2,000 Units by mouth daily.     . furosemide (LASIX) 20 MG tablet Take 2 tablets (40 mg total) by mouth daily. 60 tablet 3  . HYDROcodone-acetaminophen (NORCO) 5-325 MG per tablet Take 1 tablet by mouth every 6 (six) hours as needed.    . hydroxychloroquine (PLAQUENIL) 200 MG tablet Take 200 mg by mouth 2 (two) times daily.  5  . Inositol Niacinate (NIACIN FLUSH FREE) 500 MG CAPS Take 3 capsules by mouth daily.    Marland Kitchen levothyroxine (SYNTHROID, LEVOTHROID) 100 MCG tablet Take 100 mcg by mouth daily before breakfast.    . Multiple Vitamins-Minerals (MULTIVITAMIN WITH MINERALS) tablet Take 1 tablet by mouth daily.    . nitroGLYCERIN (NITROSTAT) 0.4 MG SL tablet Place 1 tablet (0.4 mg total) under the tongue every 5 (five)  minutes as needed for chest pain. 25 tablet 3  . potassium chloride (K-DUR) 10 MEQ tablet Take 2 tablets (20 mEq total) by mouth daily. 60 tablet 3  . sacubitril-valsartan (ENTRESTO) 49-51 MG Take 1 tablet by mouth 2 (two) times daily. 60 tablet 3   No current facility-administered medications for this encounter.    BP 130/82 mmHg  Pulse 82  Wt 166 lb 1.9 oz (75.352 kg)  SpO2 98% General: NAD Neck: JVP 8 cm, no thyromegaly or thyroid nodule.  Lungs: Decreased breath sounds/rhonchi right base.  CV: Nondisplaced PMI.  Heart regular S1/S2, no S3/S4, 1/6 SEM.  No peripheral edema.  No carotid bruit.  Normal pedal pulses.   Abdomen: Soft, nontender, no hepatosplenomegaly, mild distention.  Neurologic: Alert and oriented x 3.  Psych: Normal affect. Extremities: No clubbing or cyanosis.   Assessment/Plan: 1. Cardiomyopathy: Nonischemic cardiomyopathy. In the past, she had EF 45-50% with no significant CAD (2013).  I thought that she had a mild LBBB-related nonischemic cardiomyopathy.  However, in the fall of 2015 she developed exertional chest pain and dyspnea.  Echo and Cardiolite showed marked worsening of her LV systolic function (EF 63-81% by echo) and Cardiolite suggested possible prior MI.  Left heart cath in 1/16 showed no significant CAD.  RHC showed mildly elevated right and left heart filling pressures and normal CI. SPEP negative and no infiltrative disease on cardiac MRI.   ECHO today discussed and reviewed by Dr Haroldine Laws EF 25% Dysnchronous .  Currently, NYHA class III symptoms.  Volume status stable. - Continue Lasix to 40 mg daily and KCl to 20 daily.   - Stop carvedlol and switch to bisoprolol 5 mg daily given severe COPD.  - Continue Entresto 49/51 mg bid - Echo with EF 20-25% will refer for CRT-D (LBBB-like IVCD) => followup in office in 1 month with the echo.    2. COPD: I am concerned that some of her dyspnea may be due to COPD rather than CHF. She was a long-time smoker.  PFTs.--Severe COPD.  has pulmonary follow up this week.  3. OSA: She is sleepy and fatigued during the day.  Sleep study in June .     Refer to EP for CRT-D   CLEGG,AMY NP-C   03/09/2015  Patient seen and examined with Darrick Grinder, NP. We discussed all aspects of the encounter. I agree with the assessment and plan as stated above.   Echo and PFTs reviewed personally in clinic. EF remains in 20-25% range with significant dyssynchrony. PFTs with severe COPD and I personally did a hall walk with her and sats dropped into 81% range. Volume status ok. Remains NYHA III.  Plan: 1) Refer for CRT-D 2) Has f/u with Dr. Melvyn Novas this week  for COPD 3) Start home O2 4) Switch carvedilol to bisoprolol 5 daily.   Total time spent 35 minutes. Over half that time spent discussing above.   Alrick Cubbage,MD 5:57 PM

## 2015-03-09 NOTE — Progress Notes (Signed)
Patient ID: Stacy Hernandez, female   DOB: 1956-09-04, 59 y.o.   MRN: [id number] Patient ambulated approx 150 feet on room air.  She desaturated to 81% on room air.  She returned to 99% on room air seated after ambulation.

## 2015-03-09 NOTE — Patient Instructions (Addendum)
Please begin Bisoprolol 5 mg daily Stop Carvedilol Your physician has referred you to Electrophysiology for possible Automatic Implantable Cardioverter-Defibrillator Follow-up 4 weeks

## 2015-03-11 ENCOUNTER — Encounter: Payer: Self-pay | Admitting: Internal Medicine

## 2015-03-11 ENCOUNTER — Ambulatory Visit (INDEPENDENT_AMBULATORY_CARE_PROVIDER_SITE_OTHER): Payer: 59 | Admitting: Internal Medicine

## 2015-03-11 ENCOUNTER — Ambulatory Visit (INDEPENDENT_AMBULATORY_CARE_PROVIDER_SITE_OTHER)
Admission: RE | Admit: 2015-03-11 | Discharge: 2015-03-11 | Disposition: A | Discharge status: Home / Self Care | Payer: 59 | Source: Ambulatory Visit | Attending: Internal Medicine | Admitting: Internal Medicine

## 2015-03-11 VITALS — BP 126/88 | HR 72 | Ht 62.0 in | Wt 168.0 lb

## 2015-03-11 DIAGNOSIS — R0600 Dyspnea, unspecified: Secondary | ICD-10-CM

## 2015-03-11 NOTE — Progress Notes (Signed)
   Subjective:    Patient ID: Stacy Hernandez, female    DOB: 09/06/1956,    MRN: [id number]  HPI  89 yowf quit smoking Jan 2016 and breathing worse since followed by Dr Aundra Dubin who referred to pulmonary clinic 03/11/2015 with new elevation R HD since previous cxr done 10/19/14    03/11/2015 1st [facility] office visit/ Wert   Chief Complaint  Patient presents with  . Pulmonary Consult    Referred by Dr. Loralie Champagne. Pt c/o SOB for the past yr. She states that she gets SOB with "just about anything"- house work, walking short distances such as from room to room at home. She has occ non prod cough.  indolent onset progressive doe x 1 y worse esp since jan 2016 assoc with cough better since quit smoking and has developed sense of immediate orthopnea so sleeps on side. Min am cough productive of mucoid sputum only.     No obvious   day to day or daytime variabilty or assoc   cp or chest tightness, subjective wheeze overt sinus or hb symptoms. No unusual exp hx or h/o childhood pna/ asthma or knowledge of premature birth.   Also denies any obvious fluctuation of symptoms with weather or environmental changes or other aggravating or alleviating factors except as outlined above   Current Medications, Allergies, Complete Past Medical History, Past Surgical History, Family History, and Social History were reviewed in [employer] record.             Review of Systems  Constitutional: Negative for fever, chills and unexpected weight change.  HENT: Negative for congestion, dental problem, ear pain, nosebleeds, postnasal drip, rhinorrhea, sinus pressure, sneezing, sore throat, trouble swallowing and voice change.   Eyes: Negative for visual disturbance.  Respiratory: Positive for cough and shortness of breath. Negative for choking.   Cardiovascular: Negative for chest pain and leg swelling.  Gastrointestinal: Negative for vomiting, abdominal pain and diarrhea.    Genitourinary: Negative for difficulty urinating.  Musculoskeletal: Negative for arthralgias.  Skin: Negative for rash.  Neurological: Negative for tremors, syncope and headaches.  Hematological: Does not bruise/bleed easily.       Objective:   Physical Exam   amb wf nad  Wt Readings from Last 3 Encounters:  03/11/15 168 lb (76.204 kg)  02/09/15 167 lb 12.8 oz (76.114 kg)  12/20/14 169 lb (76.658 kg)    Vital signs reviewed   HEENT: nl dentition, turbinates, and orophanx. Nl external ear canals without cough reflex   NECK :  without JVD/Nodes/TM/ nl carotid upstrokes bilaterally   LUNGS: no acc muscle use, slt distant bs esp R base    CV:  RRR  no s3 or murmur or increase in P2, no edema   ABD:  soft and nontender with nl excursion in the supine position. No bruits or organomegaly, bowel sounds nl  MS:  warm without deformities, calf tenderness, cyanosis or clubbing  SKIN: warm and dry without lesions    NEURO:  alert, approp, no deficits     CXR PA and Lateral:   03/11/2015 :     I personally reviewed images and agree with radiology impression as follows:    Elevation of the right hemidiaphragm with associated right basilar atelectasis. No focal consolidation. No pleural effusion or pneumothorax.  Heart is normal in size.     Assessment & Plan:

## 2015-03-11 NOTE — Patient Instructions (Addendum)
anoro one click each am  Please remember to go to the x-ray department downstairs for your tests - we will call you with the results when they are available.    Please schedule a follow up office visit in 2 weeks, sooner if needed  Late add  CTa chest and Sniff test eval sob

## 2015-03-12 ENCOUNTER — Encounter: Payer: Self-pay | Admitting: Internal Medicine

## 2015-03-12 NOTE — Assessment & Plan Note (Addendum)
-   assoc with new immediate orthopnea since Jan 2016 and  new R HD elevation since cxr was nl 10/19/14 - PFTs 02/16/15  FEV1  0.97 (41%) ratio 78 and 13% better p saba , erv 13% and DLCO 32 corrects to 85  - Left ventricle: The cavity size was normal. Wall thickness was normal. Systolic function was severely reduced. The estimated ejection fraction was in the range of 20% to 25%. Doppler parameters are consistent with a reversible restrictive pattern, indicative of decreased left ventricular diastolic compliance and/or increased left atrial pressure (grade 3 diastolic dysfunction). - Aortic valve: There was trivial regurgitation. - Mitral valve: There was mild regurgitation. - Left atrium: The atrium was moderately dilated. - Right ventricle: The cavity size was mildly dilated. Systolic function was mildly reduced. - Right atrium: The atrium was mildly dilated. - 03/12/2015  Walked RA x 3 laps @ 185 ft each stopped due to end of study, nl pace, no desat   The proper method of use, as well as anticipated side effects, of a metered-dose inhaler are discussed and demonstrated to the patient. 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

## 2015-03-14 ENCOUNTER — Other Ambulatory Visit: Payer: Self-pay

## 2015-03-14 ENCOUNTER — Inpatient Hospital Stay: Admission: RE | Admit: 2015-03-14 | Payer: 59 | Source: Ambulatory Visit

## 2015-03-14 ENCOUNTER — Ambulatory Visit (HOSPITAL_COMMUNITY)
Admission: RE | Admit: 2015-03-14 | Discharge: 2015-03-14 | Disposition: A | Discharge status: Home / Self Care | Payer: 59 | Source: Ambulatory Visit | Attending: Internal Medicine | Admitting: Internal Medicine

## 2015-03-14 ENCOUNTER — Other Ambulatory Visit: Payer: Self-pay | Admitting: Internal Medicine

## 2015-03-14 ENCOUNTER — Ambulatory Visit (HOSPITAL_COMMUNITY): Payer: 59

## 2015-03-14 ENCOUNTER — Telehealth: Payer: Self-pay | Admitting: *Deleted

## 2015-03-14 ENCOUNTER — Encounter (HOSPITAL_COMMUNITY): Payer: Self-pay

## 2015-03-14 DIAGNOSIS — J986 Disorders of diaphragm: Secondary | ICD-10-CM | POA: Insufficient documentation

## 2015-03-14 DIAGNOSIS — R0600 Dyspnea, unspecified: Secondary | ICD-10-CM

## 2015-03-14 DIAGNOSIS — R0602 Shortness of breath: Secondary | ICD-10-CM | POA: Diagnosis not present

## 2015-03-14 MED ORDER — IOHEXOL 350 MG/ML SOLN
100.0000 mL | Freq: Once | INTRAVENOUS | Status: AC | PRN
  Administered 2015-03-14: 100 mL via INTRAVENOUS

## 2015-03-14 MED ORDER — IOHEXOL 300 MG/ML  SOLN: 100.0000 mL | Freq: Once | INTRAMUSCULAR | Status: AC | PRN

## 2015-03-14 NOTE — Telephone Encounter (Signed)
-----   Message from Tanda Rockers, MD sent at 03/12/2015  7:47 AM EDT ----- After reviewing records : Needs CTa chest and sniff test dx sob

## 2015-03-14 NOTE — Progress Notes (Signed)
Quick Note:  Spoke with pt and notified of results per Dr. Wert. Pt verbalized understanding and denied any questions.  ______ 

## 2015-03-14 NOTE — Telephone Encounter (Signed)
Spoke with the pt and notified of recs per MW  She verbalized understanding  Orders were sent to [facility]

## 2015-03-15 ENCOUNTER — Encounter: Payer: Self-pay | Admitting: Internal Medicine

## 2015-03-15 ENCOUNTER — Encounter: Payer: Self-pay | Admitting: *Deleted

## 2015-03-15 ENCOUNTER — Ambulatory Visit (HOSPITAL_COMMUNITY)
Admission: RE | Admit: 2015-03-15 | Discharge: 2015-03-15 | Disposition: A | Discharge status: Home / Self Care | Payer: 59 | Source: Ambulatory Visit | Attending: Internal Medicine | Admitting: Internal Medicine

## 2015-03-15 ENCOUNTER — Ambulatory Visit (INDEPENDENT_AMBULATORY_CARE_PROVIDER_SITE_OTHER): Payer: 59 | Admitting: Internal Medicine

## 2015-03-15 VITALS — BP 100/82 | HR 54 | Ht 62.0 in | Wt 167.4 lb

## 2015-03-15 DIAGNOSIS — I5022 Chronic systolic (congestive) heart failure: Secondary | ICD-10-CM | POA: Diagnosis not present

## 2015-03-15 DIAGNOSIS — R0602 Shortness of breath: Secondary | ICD-10-CM | POA: Diagnosis not present

## 2015-03-15 DIAGNOSIS — I10 Essential (primary) hypertension: Secondary | ICD-10-CM

## 2015-03-15 DIAGNOSIS — I519 Heart disease, unspecified: Secondary | ICD-10-CM | POA: Diagnosis not present

## 2015-03-15 DIAGNOSIS — Z720 Tobacco use: Secondary | ICD-10-CM | POA: Diagnosis not present

## 2015-03-15 DIAGNOSIS — I429 Cardiomyopathy, unspecified: Secondary | ICD-10-CM

## 2015-03-15 DIAGNOSIS — R0600 Dyspnea, unspecified: Secondary | ICD-10-CM

## 2015-03-15 DIAGNOSIS — I2510 Atherosclerotic heart disease of native coronary artery without angina pectoris: Secondary | ICD-10-CM

## 2015-03-15 DIAGNOSIS — I2583 Coronary atherosclerosis due to lipid rich plaque: Secondary | ICD-10-CM

## 2015-03-15 NOTE — Assessment & Plan Note (Signed)
She denies anginal symptoms. Will follow. No change in meds.

## 2015-03-15 NOTE — Progress Notes (Signed)
HPI Stacy Hernandez is referred today by Dr. Aundra Dubin for evaluation of and consideration for an ICD. She is a 59 yo woman with multiple medical problems including chronic systolic heart failure, HTN, COPD with a h/o tobacco abuse, and obesity. She also has an elevated right hemidiaphragm.  Allergies  Allergen Reactions  . Prednisone Nausea Only     Current Outpatient Prescriptions  Medication Sig Dispense Refill  . aspirin 81 MG tablet Take 81 mg by mouth daily.    Marland Kitchen atorvastatin (LIPITOR) 10 MG tablet TAKE 1 TABLET BY MOUTH AT BEDTIME 30 tablet 6  . bisoprolol (ZEBETA) 5 MG tablet Take 1 tablet (5 mg total) by mouth daily. 30 tablet 3  . Cholecalciferol (VITAMIN D-3 PO) Take 2,000 Units by mouth daily.     . furosemide (LASIX) 20 MG tablet Take 2 tablets (40 mg total) by mouth daily. 60 tablet 3  . HYDROcodone-acetaminophen (NORCO) 5-325 MG per tablet Take 1 tablet by mouth every 6 (six) hours as needed (pain).     . hydroxychloroquine (PLAQUENIL) 200 MG tablet Take 200 mg by mouth 2 (two) times daily.  5  . Inositol Niacinate (NIACIN FLUSH FREE) 500 MG CAPS Take 3 capsules by mouth daily.    Marland Kitchen levothyroxine (SYNTHROID, LEVOTHROID) 100 MCG tablet Take 100 mcg by mouth daily before breakfast.    . Multiple Vitamins-Minerals (MULTIVITAMIN WITH MINERALS) tablet Take 1 tablet by mouth daily.    . nitroGLYCERIN (NITROSTAT) 0.4 MG SL tablet Place 1 tablet (0.4 mg total) under the tongue every 5 (five) minutes as needed for chest pain. 25 tablet 3  . potassium chloride (K-DUR) 10 MEQ tablet Take 2 tablets (20 mEq total) by mouth daily. 60 tablet 3  . sacubitril-valsartan (ENTRESTO) 49-51 MG Take 1 tablet by mouth 2 (two) times daily. 60 tablet 3   No current facility-administered medications for this visit.     Past Medical History  Diagnosis Date  . Thyroid disease     Post-ablation hypothyroidism  . Hypertriglyceridemia   . Hypercalcemia     Due to HCTZ, improved with d/c of  calcium supplements  . Nephrolithiasis   . Osteoporosis   . Tobacco abuse   . Back pain     Recent positive ANA but told she has OA  . Cardiomyopathy     a.  EF 45-50% by echo 04/23/2012;  b.  Echo (12/15):  EF 10-15%, Gr 2 DD, ventricular septum dyssynergy, mod MR, mod LAE.  Marland Kitchen CAD (coronary artery disease)     a.  per cath in June 2013 with nonobstructive CAD;  b.  Lexiscan Myoview (12/15):  No ischemia.  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 Hypertension     ROS:   All systems reviewed and negative except as noted in the HPI.   Past Surgical History  Procedure Laterality Date  . Bladder surgery      bladder & bowel tac with mesh  . Partial hysterectomy      Due to cervical dysplasia  . Cesarean section    . Tonsillectomy    . Carpal tunnel release    . Left heart catheterization with coronary angiogram N/A 04/22/2012    Procedure: LEFT HEART CATHETERIZATION WITH CORONARY ANGIOGRAM;  Surgeon: Stacy Bow, MD;  Location[facility] C[facility] LAB;  Service: Cardiovascular;  Laterality: N/A;  . Left and right heart catheterization with coronary angiogram N/A  11/12/2014    Procedure: LEFT AND RIGHT HEART CATHETERIZATION WITH CORONARY ANGIOGRAM;  Surgeon: Larey Dresser, MD;  Location: [facility] CATH LAB;  Service: Cardiovascular;  Laterality: N/A;     Family History  Problem Relation Age of Onset  . Coronary artery disease Father     s/p CABG in his 66s  . Hypertension Father   . COPD Mother     smoked  . Colon polyps Mother   . Diabetes Sister   . Cirrhosis Sister   . Colon cancer Maternal Uncle 78  . Heart attack Father   . Stroke Neg Hx      History   Social History  . Marital Status: Married    Spouse Name: N/A  . Number of Children: N/A  . Years of Education: N/A   Occupational History  . Not on file.   Social History Main Topics  . Smoking status: Former Smoker -- 2.00 packs/day for 35 years    Types:  Cigarettes    Quit date: 11/05/2014  . Smokeless tobacco: Never Used  . Alcohol Use: No  . Drug Use: No  . Sexual Activity: Not Currently   Other Topics Concern  . Not on file   Social History Narrative     BP 100/82 mmHg  Pulse 54  Ht 5\' 2"  (1.575 m)  Wt 167 lb 6.4 oz (75.932 kg)  BMI 30.61 kg/m2  Physical Exam:  Well appearing NAD HEENT: Unremarkable Neck:  No JVD, no thyromegally Lymphatics:  No adenopathy Back:  No CVA tenderness Lungs:  Clear HEART:  Regular rate rhythm, no murmurs, no rubs, no clicks Abd:  soft, positive bowel sounds, no organomegally, no rebound, no guarding Ext:  2 plus pulses, no edema, no cyanosis, no clubbing Skin:  No rashes no nodules Neuro:  CN II through XII intact, motor grossly intact  EKG - NSR with a narrow QRS, old ECG's demonstrate LBBB   Assess/Plan:

## 2015-03-15 NOTE — Patient Instructions (Signed)
Medication Instructions:  Your physician recommends that you continue on your current medications as directed. Please refer to the Current Medication list given to you today.   Labwork: Your physician recommends that you return for lab work on 04/06/15 with Dr Aundra Dubin apt  BMP/CBC   Testing/Procedures: Your physician has recommended that you have a defibrillator inserted. An implantable cardioverter defibrillator (ICD) is a small device that is placed in your chest or, in rare cases, your abdomen. This device uses electrical pulses or shocks to help control life-threatening, irregular heartbeats that could lead the heart to suddenly stop beating (sudden cardiac arrest). Leads are attached to the ICD that goes into your heart. This is done in the hospital and usually requires an overnight stay. Please see the instruction sheet given to you today for more information.    Follow-Up: Your physician recommends that you schedule a follow-up appointment in: 7-10 days from 04/15/15 in device clinic for wound check   Any Other Special Instructions Will Be Listed Below (If Applicable).

## 2015-03-15 NOTE — Assessment & Plan Note (Signed)
She has had severe LV dysfunction for several years. Her symptoms are class 3 but also multifactorial. She has some underlying lung disease. She is on maximal medical therapy. I have recommended ICD implant. She has LBBB except when her sinus rate is in the low 50's. I would suggest BiV ICD. I have discussed the risks/benefits/goals/expectations of ICD implant with the patient and she wishes to proceed.

## 2015-03-15 NOTE — Assessment & Plan Note (Signed)
She has a h/o tobacco abuse but has stopped smoking. Will follow. She is encouraged not to restart.

## 2015-03-15 NOTE — Assessment & Plan Note (Signed)
Her blood pressure is well controlled. She will continue her current meds.  

## 2015-03-16 ENCOUNTER — Telehealth: Payer: Self-pay | Admitting: Internal Medicine

## 2015-03-16 NOTE — Progress Notes (Signed)
Quick Note:  Spoke with pt and notified of results per Dr. Wert. Pt verbalized understanding and denied any questions.  ______ 

## 2015-03-16 NOTE — Progress Notes (Signed)
Quick Note:  LMTCB ______ 

## 2015-03-17 ENCOUNTER — Ambulatory Visit (HOSPITAL_COMMUNITY): Payer: 59

## 2015-03-18 NOTE — Telephone Encounter (Signed)
Sorry to confuse but the result of the sniff included the ct result: they both show the same thing but the reason we do the CT is to be sure cancer is not cause and it wasn't

## 2015-03-18 NOTE — Telephone Encounter (Signed)
Pt is asking for CT results - CT done 03/14/15  Please advise thanks.

## 2015-03-18 NOTE — Telephone Encounter (Signed)
Pt aware of results per MW Pt aware that these will be discussed further at next OV Nothing further needed.

## 2015-03-18 NOTE — Telephone Encounter (Signed)
Usually the cause is a viral infection like a bell's palsy and may improve over time but we'll go over all that on return - there is no easy option for treatment that I can recommend prior to the ov

## 2015-03-18 NOTE — Telephone Encounter (Signed)
Pt calling had additional questions regarding snifff test results. Wanting to know if the paralysis will ever go away? What has caused this paralysis? Pt states that she will ask her questions at her upcoming appt with MW on 03/28/15 if unable to be answered now.  Notes Recorded by Rosana Berger, CMA on 03/16/2015 at 11:46 AM Spoke with pt and notified of results per Dr. Melvyn Novas. Pt verbalized understanding and denied any questions. 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

## 2015-03-18 NOTE — Telephone Encounter (Signed)
Pt returned call [phone]

## 2015-03-18 NOTE — Telephone Encounter (Signed)
lmtcb x1 for pt. 

## 2015-03-28 ENCOUNTER — Encounter: Payer: Self-pay | Admitting: Internal Medicine

## 2015-03-28 ENCOUNTER — Ambulatory Visit (INDEPENDENT_AMBULATORY_CARE_PROVIDER_SITE_OTHER): Payer: 59 | Admitting: Internal Medicine

## 2015-03-28 VITALS — BP 112/72 | HR 66 | Ht 62.0 in | Wt 167.4 lb

## 2015-03-28 DIAGNOSIS — R0600 Dyspnea, unspecified: Secondary | ICD-10-CM | POA: Diagnosis not present

## 2015-03-28 DIAGNOSIS — G4733 Obstructive sleep apnea (adult) (pediatric): Secondary | ICD-10-CM | POA: Diagnosis not present

## 2015-03-28 DIAGNOSIS — J986 Disorders of diaphragm: Secondary | ICD-10-CM | POA: Diagnosis not present

## 2015-03-28 NOTE — Patient Instructions (Addendum)
Weight control is simply a matter of calorie balance which needs to be tilted in your favor by eating less and exercising more(subject to cardiology approval).  To get the most out of exercise, you need to be continuously aware that you are short of breath, but never out of breath, for 30 minutes daily. As you improve, it will actually be easier for you to do the same amount of exercise  in  30 minutes so always push to the level where you are short of breath.  If this does not result in gradual weight reduction then I strongly recommend you see a nutritionist with a food diary x 2 weeks so that we can work out a negative calorie balance which is universally effective in steady weight loss programs.  Think of your calorie balance like you do your bank account where in this case you want the balance to go down so you must take in less calories than you burn up.  It's just that simple:  Hard to do, but easy to understand.  Good luck!   If you need a sleep specialist you should see one of our doctors due to the added problem of the diaphragm dysfunction which would be expected to be worse when sleeping.  For now do not recommend  any follow up but in future (say 3-4 months)  may need to be referred to [facility] if not improving in terms of ex tolerance  With treatment of your heart problems per cardiology   Late add pfts still pending

## 2015-03-28 NOTE — Progress Notes (Signed)
Subjective:    Patient ID: Stacy Hernandez, female    DOB: 1956/03/01,    MRN: [id number]    Brief patient profile:  21 yowf quit smoking Jan 2016 and breathing worse since then followed by Dr Aundra Dubin who referred to pulmonary clinic 03/11/2015 with new elevation R HD since previous cxr done 10/19/14    History of Present Illness  03/11/2015 1st [facility] office visit/ Stacy Hernandez   Chief Complaint  Patient presents with  . Pulmonary Consult    Referred by Dr. Loralie Champagne. Pt c/o SOB for the past yr. She states that she gets SOB with "just about anything"- house work, walking short distances such as from room to room at home. She has occ non prod cough.  indolent onset progressive doe x 1 y worse esp since jan 2016 assoc with cough better since quit smoking and has developed sense of immediate orthopnea so sleeps on side. Min am cough productive of mucoid sputum only.    rec anoro one click each am Late add  CTa chest and Sniff test eval sob > paralyzed R HD  03/28/2015 f/u ov/Stacy Hernandez re: paralyzed R HD /on anoro no better  Chief Complaint  Patient presents with  . Follow-up    Pt states SOB and cough are unchanged since her last visit. No new co's today.     main concern is immediate orthopnea and doe x anytime she gets in a hurry or inclines/ steps    No obvious day to day or daytime variabilty or assoc chronic cough or cp or chest tightness, subjective wheeze overt sinus or hb symptoms. No unusual exp hx or h/o childhood pna/ asthma or knowledge of premature birth.  Sleeping ok without nocturnal  or early am exacerbation  of respiratory  c/o's or need for noct saba. Also denies any obvious fluctuation of symptoms with weather or environmental changes or other aggravating or alleviating factors except as outlined above   Current Medications, Allergies, Complete Past Medical History, Past Surgical History, Family History, and Social History were reviewed in [employer] record.  ROS  The following are not active complaints unless bolded sore throat, dysphagia, dental problems, itching, sneezing,  nasal congestion or excess/ purulent secretions, ear ache,   fever, chills, sweats, unintended wt loss, pleuritic or exertional cp, hemoptysis,  orthopnea pnd or leg swelling, presyncope, palpitations, heartburn, abdominal pain, anorexia, nausea, vomiting, diarrhea  or change in bowel or urinary habits, change in stools or urine, dysuria,hematuria,  rash, arthralgias, visual complaints, headache, numbness weakness or ataxia or problems with walking or coordination,  change in mood/affect or memory.                       Objective:   Physical Exam   amb wf nad  03/28/2015        167  Wt Readings from Last 3 Encounters:  03/11/15 168 lb (76.204 kg)  02/09/15 167 lb 12.8 oz (76.114 kg)  12/20/14 169 lb (76.658 kg)    Vital signs reviewed   HEENT: nl dentition, turbinates, and orophanx. Nl external ear canals without cough reflex   NECK :  without JVD/Nodes/TM/ nl carotid upstrokes bilaterally   LUNGS: no acc muscle use, slt distant bs esp R base    CV:  RRR  no s3 or murmur or increase in P2, no edema   ABD:  soft and nontender with nl excursion in the supine position. No bruits  or organomegaly, bowel sounds nl  MS:  warm without deformities, calf tenderness, cyanosis or clubbing  SKIN: warm and dry without lesions    NEURO:  alert, approp, no deficits      I personally reviewed images and agree with radiology impression as follows:  CTa chest: 03/14/15 Negative for pulmonary embolism. Elevated right hemidiaphragm with right lower lobe atelectasis. Lungs otherwise clear.      Assessment & Plan:

## 2015-03-29 ENCOUNTER — Encounter: Payer: Self-pay | Admitting: Internal Medicine

## 2015-03-29 DIAGNOSIS — J986 Disorders of diaphragm: Secondary | ICD-10-CM | POA: Insufficient documentation

## 2015-03-29 NOTE — Assessment & Plan Note (Addendum)
Consider formal sleep eval/ may need bipap at hs to correct based on paralyzed R HD -  Does not have hypercarbia so can't do bipap on that basis.

## 2015-03-29 NOTE — Assessment & Plan Note (Signed)
See sniff 03/15/15   Discussed under dyspnea a/p

## 2015-03-29 NOTE — Assessment & Plan Note (Signed)
-   assoc with new immediate orthopnea since Jan 2016 and new R HD elevation since cxr was nl 10/19/14 - PFTs 02/16/15  FEV1  0.97 (41%) ratio 78 and 13% better p saba , erv 13% and DLCO 32 corrects to 85  - Echo 03/09/15 Left ventricle: The cavity size was normal. Wall thickness was normal. Systolic function was severely reduced. The estimated ejection fraction was in the range of 20% to 25%. Doppler parameters are consistent with a reversible restrictive pattern, indicative of decreased left ventricular diastolic compliance and/or increased left atrial pressure (grade 3 diastolic dysfunction). - Aortic valve: There was trivial regurgitation. - Mitral valve: There was mild regurgitation. - Left atrium: The atrium was moderately dilated. - Right ventricle: The cavity size was mildly dilated. Systolic function was mildly reduced. - Right atrium: The atrium was mildly dilated. - 03/11/2015  Walked RA x 3 laps @ 185 ft each stopped due to end of study, nl pace, no desat - 03/11/15 trial of anoro > dc'd 03/28/15 as not effective  - CTa 03/14/2015 > Negative for pulmonary embolism. Elevated right hemidiaphragm with right lower lobe atelectasis. Lungs otherwise clear - R Sniff test 03/15/15  > pos paralysis    I had an extended summary discussion with the patient reviewing all relevant studies completed to date and  lasting 15 to 20 minutes of a 25 minute visit on the following ongoing concerns:  1) needs pfts off anoro to sort out whether has any residual airflow obst s/p smoking cessation  2) really not a great candidate for plication of the diaphragm to prevent pendeluft but worth considering referral to [facility] for this after cards eval complete  3) in meantime, best rx, pending cards approval, is wt loss through neg cal bal/ reconditioning  Each maintenance medication was reviewed in detail including most importantly the difference between maintenance and as needed and under what  circumstances the prns are to be used.  Please see instructions for details which were reviewed in writing and the patient given a copy.

## 2015-04-06 ENCOUNTER — Encounter (HOSPITAL_COMMUNITY): Payer: Self-pay | Admitting: *Deleted

## 2015-04-06 ENCOUNTER — Ambulatory Visit (HOSPITAL_COMMUNITY)
Admission: RE | Admit: 2015-04-06 | Discharge: 2015-04-06 | Disposition: A | Discharge status: Home / Self Care | Payer: 59 | Source: Ambulatory Visit | Attending: Cardiology | Admitting: Cardiology

## 2015-04-06 VITALS — BP 106/68 | HR 76 | Wt 167.8 lb

## 2015-04-06 DIAGNOSIS — I5022 Chronic systolic (congestive) heart failure: Secondary | ICD-10-CM

## 2015-04-06 DIAGNOSIS — G4733 Obstructive sleep apnea (adult) (pediatric): Secondary | ICD-10-CM | POA: Insufficient documentation

## 2015-04-06 DIAGNOSIS — I429 Cardiomyopathy, unspecified: Secondary | ICD-10-CM | POA: Diagnosis present

## 2015-04-06 DIAGNOSIS — E785 Hyperlipidemia, unspecified: Secondary | ICD-10-CM | POA: Insufficient documentation

## 2015-04-06 DIAGNOSIS — E039 Hypothyroidism, unspecified: Secondary | ICD-10-CM | POA: Diagnosis not present

## 2015-04-06 DIAGNOSIS — I10 Essential (primary) hypertension: Secondary | ICD-10-CM | POA: Diagnosis not present

## 2015-04-06 DIAGNOSIS — J449 Chronic obstructive pulmonary disease, unspecified: Secondary | ICD-10-CM | POA: Insufficient documentation

## 2015-04-06 DIAGNOSIS — J9610 Chronic respiratory failure, unspecified whether with hypoxia or hypercapnia: Secondary | ICD-10-CM

## 2015-04-06 MED ORDER — SPIRONOLACTONE 25 MG PO TABS: 12.5000 mg | ORAL_TABLET | Freq: Every day | ORAL | Status: DC

## 2015-04-06 NOTE — Patient Instructions (Signed)
Start Spironolactone 12.5 mg (1/2 tab) daily  Labs in 10 days  Your physician recommends that you schedule a follow-up appointment in: 1 month

## 2015-04-07 NOTE — Progress Notes (Signed)
Patient ID: Stacy Hernandez, female   DOB: Dec 15, 1955, 59 y.o.   MRN: [id number] PCP: Dr. Shelia Media  59 yo with history of HTN, intermittent LBBB, and nonischemic cardiomyopathy presents for cardiology followup.  She was admitted with chest pain in 6/13.  ECG showed intermittent LBBB.  Echo showed EF 45-50%.  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    Developed CP and dyspnea. She was set up for echo and Cardiolite in 12/15.  Echo showed EF 10-15% (significant fall).  She had a Lexiscan Cardiolite with EF 25% and apical anteroseptal and apical fixed perfusion defect.  Therefore, she was sent for left and right heart catheterization in 1/16.  This showed mild elevation in LV and RV filling pressures and no significant CAD.  She was started on Lasix 20 mg daily.  Echo in 5/16 showed EF 20-25%, mildly dilated and mildly dysfunctional RV.  PFTs 4/16 showed severe obstruction and restriction.  She has seen Dr Melvyn Novas.  She is noted to have an elevated right hemidiaphragm.   She returns for HF follow up. Remains SOB with exertion. She can walk about 50 yards then has to stop.  She has orthopnea chronically and has to sleep on her side.  No chest pain.  No lightheadedness or syncope.    Labs (12/13): LDL 80, HDL 46, K 4, creatinine 0.7 Labs (6/14): K 4, creatinine 0.8, LDL 73, HDL 45 Labs (12/15): K 4.2, creatinine 0.7, HCT 43.3, LDL 60, HDL 51, BNP 577, TSH normal Labs (2/16): K 4.2, creatinine 0.7 Labs (1/16): SPEP no M spike K 4.3 Creatinine 0.74  Labs (2/16): K 4.2, creatinine 0.78 Labs (4/201/2016) : K 4.2 Creatinine 0.90, BNP 339  PMH: 1. HTN 2. Hyperlipidemia 3. COPD: Prior smoker.  PFTs (4/16) with FVC 37%, FEV1 36%, ratio 97%, TLC 67%, DLCO 32% => severe obstruction and restriction.  COPD + elevated right hemidiaphragm.  4. Intermittent LBBB/IVCD 5. Hypothyroidism 6. Cardiomyopathy: Nonischemic.  Echo (6/13) with EF 45-50%, paradoxical septal motion.  LHC (6/13) with 40% D1 stenosis, 30% PLV  stenosis.  Echo (12/15) with EF 10-15%, moderately dilated LV, diffuse hypokinesis, moderate MR.   Lexiscan Cardiolite (12/15) with EF 25%, diffuse hypokinesis, apical anteroseptal and apical fixed defect. LHC/RHC (1/16) with mean RA 8, PA 37/19, mean PCWP 19, CI 2.63, EF 30%, no significant CAD.  Cardiac MRI 12/20/2014: EF 24% Mod mitral regurg thought to be from dilated LV, normal RV size, no LGE.  Echo (5/16) with EF 20-25%, mildly dilated and mildly dysfunctional RV, mild MR.    7. 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  Quit smoking in 1/16.    FH: Father CABG at 23   ROS: All systems reviewed and negative except as per HPI.   Current Outpatient Prescriptions  Medication Sig Dispense Refill  . aspirin 81 MG tablet Take 81 mg by mouth daily.    Marland Kitchen atorvastatin (LIPITOR) 10 MG tablet TAKE 1 TABLET BY MOUTH AT BEDTIME 30 tablet 6  . bisoprolol (ZEBETA) 5 MG tablet Take 1 tablet (5 mg total) by mouth daily. 30 tablet 3  . Cholecalciferol (VITAMIN D-3 PO) Take 2,000 Units by mouth daily.     . furosemide (LASIX) 20 MG tablet Take 2 tablets (40 mg total) by mouth daily. 60 tablet 3  . HYDROcodone-acetaminophen (NORCO) 5-325 MG per tablet Take 1 tablet by mouth every 6 (six) hours as  needed (pain).     . hydroxychloroquine (PLAQUENIL) 200 MG tablet Take 200 mg by mouth 2 (two) times daily.  5  . Inositol Niacinate (NIACIN FLUSH FREE) 500 MG CAPS Take 3 capsules by mouth daily.    Marland Kitchen levothyroxine (SYNTHROID, LEVOTHROID) 100 MCG tablet Take 100 mcg by mouth daily before breakfast.    . Multiple Vitamins-Minerals (MULTIVITAMIN WITH MINERALS) tablet Take 1 tablet by mouth daily.    . nitroGLYCERIN (NITROSTAT) 0.4 MG SL tablet Place 1 tablet (0.4 mg total) under the tongue every 5 (five) minutes as needed for chest pain. 25 tablet 3  . potassium chloride (K-DUR) 10 MEQ tablet Take 2 tablets (20  mEq total) by mouth daily. 60 tablet 3  . sacubitril-valsartan (ENTRESTO) 49-51 MG Take 1 tablet by mouth 2 (two) times daily. 60 tablet 3  . Umeclidinium-Vilanterol 62.5-25 MCG/INH AEPB Inhale 1 puff into the lungs daily.    Marland Kitchen spironolactone (ALDACTONE) 25 MG tablet Take 0.5 tablets (12.5 mg total) by mouth daily. 15 tablet 3   No current facility-administered medications for this encounter.    BP 106/68 mmHg  Pulse 76  Wt 167 lb 12 oz (76.091 kg)  SpO2 94% General: NAD Neck: JVP not elevated, no thyromegaly or thyroid nodule.  Lungs: Decreased breath sounds/rhonchi right base.  CV: Nondisplaced PMI.  Heart regular S1/S2, no S3/S4, 1/6 SEM.  No peripheral edema.  No carotid bruit.  Normal pedal pulses.  Abdomen: Soft, nontender, no hepatosplenomegaly, mild distention.  Neurologic: Alert and oriented x 3.  Psych: Normal affect. Extremities: No clubbing or cyanosis.   Assessment/Plan: 1. Cardiomyopathy: Nonischemic cardiomyopathy. In the past, she had EF 45-50% with no significant CAD (2013).  I thought that she had a mild LBBB-related nonischemic cardiomyopathy.  However, in the fall of 2015 she developed exertional chest pain and dyspnea.  Echo and Cardiolite showed marked worsening of her LV systolic function (EF 56-31% by echo) and Cardiolite suggested possible prior MI.  Left heart cath in 1/16 showed no significant CAD.  RHC showed mildly elevated right and left heart filling pressures and normal CI. SPEP negative and no infiltrative disease on cardiac MRI.  Repeat echo in 5/16 with EF 20-25%.  NYHA class III symptoms.  She does not appear volume overloaded, I think that lung disease plays a significant role in her dyspnea.  - Continue Lasix to 40 mg daily and KCl to 20 daily.   - Continue bisoprolol 5 mg daily.  - Continue Entresto 49/51 mg bid.  - Add spironolactone 12.5 mg daily.   - She is set up to get CRT-D system next Friday.    2. COPD: I am concerned that some of her dyspnea  may be due to COPD rather than CHF. She was a long-time smoker.  PFTs showed severe COPD but also restriction from elevated right hemidiaphragm.  She has seen pulmonary.   3. OSA: She is sleepy and fatigued during the day.  Sleep study in June .     Followup in 2 months.   Loralie Champagne 04/07/2015

## 2015-04-11 ENCOUNTER — Encounter: Payer: Self-pay | Admitting: Internal Medicine

## 2015-04-13 ENCOUNTER — Ambulatory Visit (HOSPITAL_COMMUNITY)
Admission: RE | Admit: 2015-04-13 | Discharge: 2015-04-13 | Disposition: A | Discharge status: Home / Self Care | Payer: 59 | Source: Ambulatory Visit | Attending: Internal Medicine | Admitting: Internal Medicine

## 2015-04-13 DIAGNOSIS — I5022 Chronic systolic (congestive) heart failure: Secondary | ICD-10-CM | POA: Diagnosis not present

## 2015-04-13 LAB — BASIC METABOLIC PANEL
Anion gap: 8 (ref 5–15)
BUN: 21 mg/dL — ABNORMAL HIGH (ref 6–20)
CO2: 24 mmol/L (ref 22–32)
Calcium: 9.4 mg/dL (ref 8.9–10.3)
Chloride: 108 mmol/L (ref 101–111)
Creatinine, Ser: 1.01 mg/dL — ABNORMAL HIGH (ref 0.44–1.00)
GFR calc Af Amer: >60 mL/min (ref >60)
GFR calc non Af Amer: >60 mL/min (ref >60)
Glucose, Bld: 88 mg/dL (ref 65–99)
Potassium: 4.5 mmol/L (ref 3.5–5.1)
Sodium: 140 mmol/L (ref 135–145)

## 2015-04-13 LAB — BRAIN NATRIURETIC PEPTIDE: B Natriuretic Peptide: 98.1 pg/mL (ref 0.0–100.0)

## 2015-04-13 LAB — CBC
HCT: 38.5 % (ref 36.0–46.0)
Hemoglobin: 12.7 g/dL (ref 12.0–15.0)
MCH: 30.5 pg (ref 26.0–34.0)
MCHC: 33 g/dL (ref 30.0–36.0)
MCV: 92.5 fL (ref 78.0–100.0)
Platelets: 227 10*3/uL (ref 150–400)
RBC: 4.16 MIL/uL (ref 3.87–5.11)
RDW: 12.9 % (ref 11.5–15.5)
WBC: 7.9 10*3/uL (ref 4.0–10.5)

## 2015-04-14 MED ORDER — CEFAZOLIN SODIUM-DEXTROSE 2-3 GM-% IV SOLR: 2.0000 g | INTRAVENOUS | Status: DC

## 2015-04-14 MED ORDER — SODIUM CHLORIDE 0.9 % IR SOLN
80.0000 mg | Status: DC
  Filled 2015-04-14: qty 2

## 2015-04-15 ENCOUNTER — Encounter (HOSPITAL_COMMUNITY): Payer: Self-pay

## 2015-04-15 ENCOUNTER — Ambulatory Visit (HOSPITAL_COMMUNITY)
Admission: RE | Admit: 2015-04-15 | Discharge: 2015-04-16 | Disposition: A | Discharge status: Home / Self Care | Payer: 59 | Source: Ambulatory Visit | Attending: Internal Medicine | Admitting: Internal Medicine

## 2015-04-15 ENCOUNTER — Encounter (HOSPITAL_COMMUNITY)
Admission: RE | Disposition: A | Discharge status: Home / Self Care | Payer: PRIVATE HEALTH INSURANCE | Source: Ambulatory Visit | Attending: Internal Medicine

## 2015-04-15 DIAGNOSIS — I429 Cardiomyopathy, unspecified: Secondary | ICD-10-CM

## 2015-04-15 DIAGNOSIS — E669 Obesity, unspecified: Secondary | ICD-10-CM | POA: Insufficient documentation

## 2015-04-15 DIAGNOSIS — Z87891 Personal history of nicotine dependence: Secondary | ICD-10-CM | POA: Diagnosis not present

## 2015-04-15 DIAGNOSIS — I2510 Atherosclerotic heart disease of native coronary artery without angina pectoris: Secondary | ICD-10-CM | POA: Diagnosis not present

## 2015-04-15 DIAGNOSIS — J449 Chronic obstructive pulmonary disease, unspecified: Secondary | ICD-10-CM | POA: Diagnosis not present

## 2015-04-15 DIAGNOSIS — M810 Age-related osteoporosis without current pathological fracture: Secondary | ICD-10-CM | POA: Diagnosis not present

## 2015-04-15 DIAGNOSIS — I428 Other cardiomyopathies: Secondary | ICD-10-CM | POA: Insufficient documentation

## 2015-04-15 DIAGNOSIS — I10 Essential (primary) hypertension: Secondary | ICD-10-CM | POA: Insufficient documentation

## 2015-04-15 DIAGNOSIS — I5022 Chronic systolic (congestive) heart failure: Secondary | ICD-10-CM | POA: Diagnosis not present

## 2015-04-15 DIAGNOSIS — E039 Hypothyroidism, unspecified: Secondary | ICD-10-CM | POA: Diagnosis not present

## 2015-04-15 DIAGNOSIS — E781 Pure hyperglyceridemia: Secondary | ICD-10-CM | POA: Diagnosis not present

## 2015-04-15 DIAGNOSIS — Z7982 Long term (current) use of aspirin: Secondary | ICD-10-CM | POA: Insufficient documentation

## 2015-04-15 DIAGNOSIS — I447 Left bundle-branch block, unspecified: Secondary | ICD-10-CM | POA: Diagnosis not present

## 2015-04-15 DIAGNOSIS — I255 Ischemic cardiomyopathy: Secondary | ICD-10-CM | POA: Diagnosis not present

## 2015-04-15 DIAGNOSIS — Z87442 Personal history of urinary calculi: Secondary | ICD-10-CM | POA: Diagnosis not present

## 2015-04-15 DIAGNOSIS — Z6830 Body mass index (BMI) 30.0-30.9, adult: Secondary | ICD-10-CM | POA: Diagnosis not present

## 2015-04-15 DIAGNOSIS — Z95810 Presence of automatic (implantable) cardiac defibrillator: Secondary | ICD-10-CM

## 2015-04-15 DIAGNOSIS — I519 Heart disease, unspecified: Secondary | ICD-10-CM

## 2015-04-15 HISTORY — DX: Presence of automatic (implantable) cardiac defibrillator: Z95.810

## 2015-04-15 HISTORY — DX: Unspecified osteoarthritis, unspecified site: M19.90

## 2015-04-15 HISTORY — PX: EP IMPLANTABLE DEVICE: SHX172B

## 2015-04-15 HISTORY — DX: Chronic obstructive pulmonary disease, unspecified: J44.9

## 2015-04-15 LAB — SURGICAL PCR SCREEN
MRSA, PCR: NEGATIVE
Staphylococcus aureus: NEGATIVE

## 2015-04-15 SURGERY — BIV ICD INSERTION CRT-D

## 2015-04-15 MED ORDER — BISOPROLOL FUMARATE 5 MG PO TABS
5.0000 mg | ORAL_TABLET | Freq: Every day | ORAL | Status: DC
  Administered 2015-04-15 – 2015-04-16 (×2): 5 mg via ORAL
  Filled 2015-04-15 (×2): qty 1

## 2015-04-15 MED ORDER — LEVOTHYROXINE SODIUM 100 MCG PO TABS
100.0000 ug | ORAL_TABLET | Freq: Every day | ORAL | Status: DC
  Administered 2015-04-16: 100 ug via ORAL
  Filled 2015-04-15: qty 1

## 2015-04-15 MED ORDER — CHLORHEXIDINE GLUCONATE 4 % EX LIQD: 60.0000 mL | Freq: Once | CUTANEOUS | Status: DC

## 2015-04-15 MED ORDER — FENTANYL CITRATE (PF) 100 MCG/2ML IJ SOLN
INTRAMUSCULAR | Status: AC
  Filled 2015-04-15: qty 2

## 2015-04-15 MED ORDER — FUROSEMIDE 40 MG PO TABS
40.0000 mg | ORAL_TABLET | Freq: Every day | ORAL | Status: DC
  Administered 2015-04-15: 40 mg via ORAL
  Filled 2015-04-15 (×2): qty 1

## 2015-04-15 MED ORDER — NITROGLYCERIN 0.4 MG SL SUBL: 0.4000 mg | SUBLINGUAL_TABLET | SUBLINGUAL | Status: DC | PRN

## 2015-04-15 MED ORDER — CEFAZOLIN SODIUM-DEXTROSE 2-3 GM-% IV SOLR
INTRAVENOUS | Status: AC
  Filled 2015-04-15: qty 50

## 2015-04-15 MED ORDER — MUPIROCIN 2 % EX OINT
TOPICAL_OINTMENT | CUTANEOUS | Status: AC
  Administered 2015-04-15: 1
  Filled 2015-04-15: qty 22

## 2015-04-15 MED ORDER — SODIUM CHLORIDE 0.9 % IV BOLUS (SEPSIS)
INTRAVENOUS | Status: DC | PRN
  Administered 2015-04-15: 250 mL via INTRAVENOUS

## 2015-04-15 MED ORDER — IOHEXOL 350 MG/ML SOLN
INTRAVENOUS | Status: DC | PRN
  Administered 2015-04-15: 35 mL via INTRAVENOUS

## 2015-04-15 MED ORDER — FENTANYL CITRATE (PF) 100 MCG/2ML IJ SOLN
INTRAMUSCULAR | Status: DC | PRN
  Administered 2015-04-15 (×10): 12.5 ug via INTRAVENOUS
  Administered 2015-04-15: 25 ug via INTRAVENOUS
  Administered 2015-04-15 (×2): 12.5 ug via INTRAVENOUS

## 2015-04-15 MED ORDER — ATORVASTATIN CALCIUM 10 MG PO TABS
10.0000 mg | ORAL_TABLET | Freq: Every day | ORAL | Status: DC
  Administered 2015-04-15: 10 mg via ORAL
  Filled 2015-04-15: qty 1

## 2015-04-15 MED ORDER — SACUBITRIL-VALSARTAN 49-51 MG PO TABS
1.0000 | ORAL_TABLET | Freq: Two times a day (BID) | ORAL | Status: DC
  Administered 2015-04-15 – 2015-04-16 (×3): 1 via ORAL
  Filled 2015-04-15 (×4): qty 1

## 2015-04-15 MED ORDER — CEFAZOLIN SODIUM 1-5 GM-% IV SOLN
1.0000 g | Freq: Four times a day (QID) | INTRAVENOUS | Status: AC
  Administered 2015-04-15 – 2015-04-16 (×3): 1 g via INTRAVENOUS
  Filled 2015-04-15 (×3): qty 50

## 2015-04-15 MED ORDER — MIDAZOLAM HCL 5 MG/5ML IJ SOLN
INTRAMUSCULAR | Status: AC
  Filled 2015-04-15: qty 5

## 2015-04-15 MED ORDER — SODIUM CHLORIDE 0.9 % IR SOLN
Status: DC | PRN
  Administered 2015-04-15: 500 mL

## 2015-04-15 MED ORDER — ASPIRIN 81 MG PO CHEW
81.0000 mg | CHEWABLE_TABLET | Freq: Every day | ORAL | Status: DC
  Administered 2015-04-15 – 2015-04-16 (×2): 81 mg via ORAL
  Filled 2015-04-15 (×3): qty 1

## 2015-04-15 MED ORDER — HYDROXYCHLOROQUINE SULFATE 200 MG PO TABS
200.0000 mg | ORAL_TABLET | Freq: Two times a day (BID) | ORAL | Status: DC
  Administered 2015-04-15 – 2015-04-16 (×3): 200 mg via ORAL
  Filled 2015-04-15 (×3): qty 1

## 2015-04-15 MED ORDER — SODIUM CHLORIDE 0.9 % IV SOLN
INTRAVENOUS | Status: DC
  Administered 2015-04-15: 08:00:00 via INTRAVENOUS

## 2015-04-15 MED ORDER — HYDROCODONE-ACETAMINOPHEN 5-325 MG PO TABS
1.0000 | ORAL_TABLET | Freq: Every day | ORAL | Status: DC
  Administered 2015-04-15: 1 via ORAL
  Filled 2015-04-15: qty 1

## 2015-04-15 MED ORDER — ACETAMINOPHEN 325 MG PO TABS
325.0000 mg | ORAL_TABLET | ORAL | Status: DC | PRN
Start: 2015-04-15 — End: 2015-04-16

## 2015-04-15 MED ORDER — POTASSIUM CHLORIDE ER 10 MEQ PO TBCR
20.0000 meq | EXTENDED_RELEASE_TABLET | Freq: Every day | ORAL | Status: DC
  Administered 2015-04-15 – 2015-04-16 (×2): 20 meq via ORAL
  Filled 2015-04-15 (×4): qty 2

## 2015-04-15 MED ORDER — LIDOCAINE HCL (PF) 1 % IJ SOLN
INTRAMUSCULAR | Status: AC
  Filled 2015-04-15: qty 30

## 2015-04-15 MED ORDER — PNEUMOCOCCAL VAC POLYVALENT 25 MCG/0.5ML IJ INJ
0.5000 mL | INJECTION | INTRAMUSCULAR | Status: DC
  Filled 2015-04-15: qty 0.5

## 2015-04-15 MED ORDER — ONDANSETRON HCL 4 MG/2ML IJ SOLN: 4.0000 mg | Freq: Four times a day (QID) | INTRAMUSCULAR | Status: DC | PRN

## 2015-04-15 MED ORDER — MIDAZOLAM HCL 5 MG/5ML IJ SOLN
INTRAMUSCULAR | Status: DC | PRN
  Administered 2015-04-15 (×14): 1 mg via INTRAVENOUS

## 2015-04-15 MED ORDER — LIDOCAINE HCL (PF) 1 % IJ SOLN
INTRAMUSCULAR | Status: DC | PRN
  Administered 2015-04-15: 50 mL

## 2015-04-15 MED ORDER — DEXTROSE 5 % IV SOLN
2.0000 g | INTRAVENOUS | Status: DC | PRN
  Administered 2015-04-15: 2 g via INTRAVENOUS

## 2015-04-15 SURGICAL SUPPLY — 19 items
CABLE SURGICAL S-101-97-12 (CABLE) ×1 IMPLANT
CATH ATTAIN COMMAND 6250-MB2 (CATHETERS) ×1 IMPLANT
CATH ATTAIN SELECT 6238TEL (CATHETERS) ×1 IMPLANT
CATH HEX JOS 2-5-2 65CM 6F REP (CATHETERS) ×1 IMPLANT
GUIDEWIRE ANGLED .035X150CM (WIRE) ×1 IMPLANT
ICD VIVA XT CRT-D DTBA1D1 (ICD Generator) ×1 IMPLANT
KIT ESSENTIALS PG (KITS) ×1 IMPLANT
LEAD ATTAIN ABILITY 4396-88CM (Lead) ×1 IMPLANT
LEAD CAPSURE NOVUS 45CM (Lead) ×1 IMPLANT
LEAD SPRINT QUAT SEC 6935-58CM (Lead) ×1 IMPLANT
PAD DEFIB LIFELINK (PAD) ×1 IMPLANT
SHEATH CLASSIC 9.5F (SHEATH) ×1 IMPLANT
SHEATH CLASSIC 9F (SHEATH) ×1 IMPLANT
SHEATH COOK PEEL AWAY SET 7F (SHEATH) ×1 IMPLANT
SHEATH PINNACLE 7F 10CM (SHEATH) IMPLANT
TRAY PACEMAKER INSERTION (CUSTOM PROCEDURE TRAY) ×1 IMPLANT
WIRE ACUITY WHISPER EDS 4648 (WIRE) ×1 IMPLANT
WIRE ASAHI SOFT 180CM (WIRE) ×1 IMPLANT
WIRE MAILMAN 182CM (WIRE) ×1 IMPLANT

## 2015-04-15 NOTE — Interval H&P Note (Signed)
History and Physical Interval Note:  04/15/2015 9:04 AM  Bing Matter  has presented today for surgery, with the diagnosis of lv disfunction/left bundle/chf  The various methods of treatment have been discussed with the patient and family. After consideration of risks, benefits and other options for treatment, the patient has consented to  Procedure(s): BiV ICD Insertion CRT-D (N/A) as a surgical intervention .  The patient's history has been reviewed, patient examined, no change in status, stable for surgery.  I have reviewed the patient's chart and labs.  Questions were answered to the patient's satisfaction.     Mikle Bosworth.D.

## 2015-04-15 NOTE — H&P (View-Only) (Signed)
HPI Stacy Hernandez is referred today by Dr. Aundra Dubin for evaluation of and consideration for an ICD. She is a 59 yo woman with multiple medical problems including chronic systolic heart failure, HTN, COPD with a h/o tobacco abuse, and obesity. She also has an elevated right hemidiaphragm.  Allergies  Allergen Reactions  . Prednisone Nausea Only     Current Outpatient Prescriptions  Medication Sig Dispense Refill  . aspirin 81 MG tablet Take 81 mg by mouth daily.    Marland Kitchen atorvastatin (LIPITOR) 10 MG tablet TAKE 1 TABLET BY MOUTH AT BEDTIME 30 tablet 6  . bisoprolol (ZEBETA) 5 MG tablet Take 1 tablet (5 mg total) by mouth daily. 30 tablet 3  . Cholecalciferol (VITAMIN D-3 PO) Take 2,000 Units by mouth daily.     . furosemide (LASIX) 20 MG tablet Take 2 tablets (40 mg total) by mouth daily. 60 tablet 3  . HYDROcodone-acetaminophen (NORCO) 5-325 MG per tablet Take 1 tablet by mouth every 6 (six) hours as needed (pain).     . hydroxychloroquine (PLAQUENIL) 200 MG tablet Take 200 mg by mouth 2 (two) times daily.  5  . Inositol Niacinate (NIACIN FLUSH FREE) 500 MG CAPS Take 3 capsules by mouth daily.    Marland Kitchen levothyroxine (SYNTHROID, LEVOTHROID) 100 MCG tablet Take 100 mcg by mouth daily before breakfast.    . Multiple Vitamins-Minerals (MULTIVITAMIN WITH MINERALS) tablet Take 1 tablet by mouth daily.    . nitroGLYCERIN (NITROSTAT) 0.4 MG SL tablet Place 1 tablet (0.4 mg total) under the tongue every 5 (five) minutes as needed for chest pain. 25 tablet 3  . potassium chloride (K-DUR) 10 MEQ tablet Take 2 tablets (20 mEq total) by mouth daily. 60 tablet 3  . sacubitril-valsartan (ENTRESTO) 49-51 MG Take 1 tablet by mouth 2 (two) times daily. 60 tablet 3   No current facility-administered medications for this visit.     Past Medical History  Diagnosis Date  . Thyroid disease     Post-ablation hypothyroidism  . Hypertriglyceridemia   . Hypercalcemia     Due to HCTZ, improved with d/c of  calcium supplements  . Nephrolithiasis   . Osteoporosis   . Tobacco abuse   . Back pain     Recent positive ANA but told she has OA  . Cardiomyopathy     a.  EF 45-50% by echo 04/23/2012;  b.  Echo (12/15):  EF 10-15%, Gr 2 DD, ventricular septum dyssynergy, mod MR, mod LAE.  Marland Kitchen CAD (coronary artery disease)     a.  per cath in June 2013 with nonobstructive CAD;  b.  Lexiscan Myoview (12/15):  No ischemia.  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 Hypertension     ROS:   All systems reviewed and negative except as noted in the HPI.   Past Surgical History  Procedure Laterality Date  . Bladder surgery      bladder & bowel tac with mesh  . Partial hysterectomy      Due to cervical dysplasia  . Cesarean section    . Tonsillectomy    . Carpal tunnel release    . Left heart catheterization with coronary angiogram N/A 04/22/2012    Procedure: LEFT HEART CATHETERIZATION WITH CORONARY ANGIOGRAM;  Surgeon: Hillary Bow, MD;  Location[facility] C[facility] LAB;  Service: Cardiovascular;  Laterality: N/A;  . Left and right heart catheterization with coronary angiogram N/A  11/12/2014    Procedure: LEFT AND RIGHT HEART CATHETERIZATION WITH CORONARY ANGIOGRAM;  Surgeon: Larey Dresser, MD;  Location[facility] LAB;  Service: Cardiovascular;  Laterality: N/A;     Family History  Problem Relation Age of Onset  . Coronary artery disease Father     s/p CABG in his 65s  . Hypertension Father   . COPD Mother     smoked  . Colon polyps Mother   . Diabetes Sister   . Cirrhosis Sister   . Colon cancer Maternal Uncle 78  . Heart attack Father   . Stroke Neg Hx      History   Social History  . Marital Status: Married    Spouse Name: N/A  . Number of Children: N/A  . Years of Education: N/A   Occupational History  . Not on file.   Social History Main Topics  . Smoking status: Former Smoker -- 2.00 packs/day for 35 years    Types:  Cigarettes    Quit date: 11/05/2014  . Smokeless tobacco: Never Used  . Alcohol Use: No  . Drug Use: No  . Sexual Activity: Not Currently   Other Topics Concern  . Not on file   Social History Narrative     BP 100/82 mmHg  Pulse 54  Ht 5\' 2"  (1.575 m)  Wt 167 lb 6.4 oz (75.932 kg)  BMI 30.61 kg/m2  Physical Exam:  Well appearing NAD HEENT: Unremarkable Neck:  No JVD, no thyromegally Lymphatics:  No adenopathy Back:  No CVA tenderness Lungs:  Clear HEART:  Regular rate rhythm, no murmurs, no rubs, no clicks Abd:  soft, positive bowel sounds, no organomegally, no rebound, no guarding Ext:  2 plus pulses, no edema, no cyanosis, no clubbing Skin:  No rashes no nodules Neuro:  CN II through XII intact, motor grossly intact  EKG - NSR with a narrow QRS, old ECG's demonstrate LBBB   Assess/Plan:

## 2015-04-16 ENCOUNTER — Ambulatory Visit (HOSPITAL_COMMUNITY): Payer: 59

## 2015-04-16 ENCOUNTER — Encounter (HOSPITAL_COMMUNITY): Payer: Self-pay | Admitting: Physician Assistant

## 2015-04-16 DIAGNOSIS — I428 Other cardiomyopathies: Secondary | ICD-10-CM | POA: Diagnosis not present

## 2015-04-16 DIAGNOSIS — Z95810 Presence of automatic (implantable) cardiac defibrillator: Secondary | ICD-10-CM

## 2015-04-16 DIAGNOSIS — I255 Ischemic cardiomyopathy: Secondary | ICD-10-CM | POA: Diagnosis not present

## 2015-04-16 NOTE — Progress Notes (Signed)
Patient ID: Stacy Hernandez, female   DOB: 09-Jul-1956, 59 y.o.   MRN: [id number]    Patient Name: Stacy Hernandez Date of Encounter: 04/16/2015     Active Problems:   Chronic systolic CHF (congestive heart failure)   S/P ICD (internal cardiac defibrillator) procedure    SUBJECTIVE  S/p Biv ICD implant, doing well with minimal incisional pain.  CURRENT MEDS . aspirin  81 mg Oral Daily  . atorvastatin  10 mg Oral QHS  . bisoprolol  5 mg Oral Daily  . furosemide  40 mg Oral Daily  . HYDROcodone-acetaminophen  1 tablet Oral QHS  . hydroxychloroquine  200 mg Oral BID  . levothyroxine  100 mcg Oral QAC breakfast  . pneumococcal 23 valent vaccine  0.5 mL Intramuscular Tomorrow-1000  . potassium chloride  20 mEq Oral Daily  . sacubitril-valsartan  1 tablet Oral BID    OBJECTIVE  Filed Vitals:   04/15/15 1340 04/15/15 2037 04/16/15 0030 04/16/15 0436  BP: 160/80 117/73 122/58 124/64  Pulse:  75 66 60  Temp:  98.7 F (37.1 C) 98.2 F (36.8 C) 98.4 F (36.9 C)  TempSrc:  Oral Oral Oral  Resp:  18 18 18   Height:      Weight:    165 lb (74.844 kg)  SpO2:  96% 96% 97%    Intake/Output Summary (Last 24 hours) at 04/16/15 0823 Last data filed at 04/15/15 1800  Gross per 24 hour  Intake     50 ml  Output      0 ml  Net     50 ml   Filed Weights   04/15/15 0731 04/16/15 0436  Weight: 167 lb (75.751 kg) 165 lb (74.844 kg)    PHYSICAL EXAM  General: Pleasant, NAD. Neuro: Alert and oriented X 3. Moves all extremities spontaneously. Psych: Normal affect. HEENT:  Normal  Neck: Supple without bruits or JVD. Lungs:  Resp regular and unlabored, CTA. minmal hematoma. Heart: RRR no s3, s4, or murmurs. Abdomen: Soft, non-tender, non-distended, BS + x 4.  Extremities: No clubbing, cyanosis or edema. DP/PT/Radials 2+ and equal bilaterally.  Accessory Clinical Findings  CBC  Recent Labs  04/13/15 1632  WBC 7.9  HGB 12.7  HCT 38.5  MCV 92.5  PLT 756   Basic Metabolic  Panel  Recent Labs  04/13/15 1631  NA 140  K 4.5  CL 108  CO2 24  GLUCOSE 88  BUN 21*  CREATININE 1.01*  CALCIUM 9.4   Liver Function Tests No results for input(s): AST, ALT, ALKPHOS, BILITOT, PROT, ALBUMIN in the last 72 hours. No results for input(s): LIPASE, AMYLASE in the last 72 hours. Cardiac Enzymes No results for input(s): CKTOTAL, CKMB, CKMBINDEX, TROPONINI in the last 72 hours. BNP Invalid input(s): POCBNP D-Dimer No results for input(s): DDIMER in the last 72 hours. Hemoglobin A1C No results for input(s): HGBA1C in the last 72 hours. Fasting Lipid Panel No results for input(s): CHOL, HDL, LDLCALC, TRIG, CHOLHDL, LDLDIRECT in the last 72 hours. Thyroid Function Tests No results for input(s): TSH, T4TOTAL, T3FREE, THYROIDAB in the last 72 hours.  Invalid input(s): FREET3  TELE  nsr with biv pacing  Radiology/Studies  Dg Chest 2 View  04/16/2015   CLINICAL DATA:  Status post defibrillator placement  EXAM: CHEST - 2 VIEW  COMPARISON:  03/11/2015  FINDINGS: Cardiac shadow is mildly enlarged but stable. A defibrillator is now seen. No pneumothorax is noted. Persistent elevation of the right hemidiaphragm is noted. Mild  right basilar atelectasis is again seen. No bony abnormality is noted.  IMPRESSION: No evidence of pneumothorax following defibrillator placement. Persistent right basilar changes are noted.   Electronically Signed   By: Inez Catalina M.D.   On: 04/16/2015 07:42    ASSESSMENT AND PLAN  1. Chronic systolic heart failure 2. Intermittent LBBB 3. Non-ischemic CM 4. S/p BIV ICD with normal device function. Rec: ok for discharge. Usual followup. Continue current meds.   Gregg Taylor,M.D.  04/16/2015 8:23 AM

## 2015-04-16 NOTE — Discharge Summary (Signed)
CARDIOLOGY DISCHARGE SUMMARY   Patient ID: Stacy Hernandez MRN: [id number] DOB/AGE: 1955-12-22 59 y.o.  Admit date: 04/15/2015 Discharge date: 04/16/2015  PCP: Baldemar Friday, FNP Primary Cardiologist: Dr. Aundra Dubin Electrophysiologist: Dr. Lovena Le  Primary Discharge Diagnosis:  Chronic systolic CHF, class III Secondary Discharge Diagnosis:    S/P ICD (internal cardiac defibrillator) procedure - Medtronic (serial Number [id number]) biventricular ICD   Left bundle branch block   Nonischemic cardiomyopathy  PROCEDURES:  1. Biventricular ICD implantation. 2. Defibrillation threshold testing 3.Venography of the coronary sinus  Hospital Course: Stacy Hernandez is a 59 y.o. female with a history of chronic systolic CHF class III, hypertension, COPD, history of tobacco use and obesity. She was evaluated by Dr. Lovena Le for ICD insertion and scheduled for the procedure. She came to the hospital for this on 04/15/2015.   A Medtronic biventricular ICD was inserted without immediate complication. She tolerated the procedure well.  On 03/16/2015, she was evaluated by Dr. Lovena Le and all data were reviewed.  Her postprocedure chest x-ray results are below, it showed no pneumothorax or other acute process.  She was ambulating without chest pain or shortness of breath. Her ECG showed sinus rhythm with biventricular pacing.   No med changes were indicated. She is considered stable for discharge, to follow up as an outpatient.   Labs:   Lab Results  Component Value Date   WBC 7.9 04/13/2015   HGB 12.7 04/13/2015   HCT 38.5 04/13/2015   MCV 92.5 04/13/2015   PLT 227 04/13/2015     Recent Labs Lab 04/13/15 1631  NA 140  K 4.5  CL 108  CO2 24  BUN 21*  CREATININE 1.01*  CALCIUM 9.4  GLUCOSE 88      Radiology: Dg Chest 2 View  04/16/2015   CLINICAL DATA:  Status post defibrillator placement  EXAM: CHEST - 2 VIEW  COMPARISON:  03/11/2015  FINDINGS: Cardiac shadow is  mildly enlarged but stable. A defibrillator is now seen. No pneumothorax is noted. Persistent elevation of the right hemidiaphragm is noted. Mild right basilar atelectasis is again seen. No bony abnormality is noted.  IMPRESSION: No evidence of pneumothorax following defibrillator placement. Persistent right basilar changes are noted.   Electronically Signed   By: Inez Catalina M.D.   On: 04/16/2015 07:42    ICD insertion: 04/15/2015 CONCLUSIONS:  1. Nonischemic cardiomyopathy with Left bundle-branch block and chronic New [place] Heart Association class III heart failure.  2. Successful biventricular ICD implantation.  3. DFT less than or equal to 20joules.  4. No early apparent complications.   EKG: 04/16/2015 Sinus rhythm, biventricular pacing  FOLLOW UP PLANS AND APPOINTMENTS Allergies  Allergen Reactions  . Prednisone Nausea Only     Medication List    TAKE these medications        aspirin 81 MG tablet  Take 81 mg by mouth daily.     atorvastatin 10 MG tablet  Commonly known as:  LIPITOR  TAKE 1 TABLET BY MOUTH AT BEDTIME     bisoprolol 5 MG tablet  Commonly known as:  ZEBETA  Take 1 tablet (5 mg total) by mouth daily.     furosemide 20 MG tablet  Commonly known as:  LASIX  Take 2 tablets (40 mg total) by mouth daily.     HYDROcodone-acetaminophen 5-325 MG per tablet  Commonly known as:  NORCO/VICODIN  Take 1 tablet by mouth at bedtime.     hydroxychloroquine 200 MG tablet  Commonly known  as:  PLAQUENIL  Take 200 mg by mouth 2 (two) times daily.     levothyroxine 100 MCG tablet  Commonly known as:  SYNTHROID, LEVOTHROID  Take 100 mcg by mouth daily before breakfast.     multivitamin with minerals tablet  Take 1 tablet by mouth daily.     NIACIN FLUSH FREE 500 MG Caps  Generic drug:  Inositol Niacinate  Take 3 capsules by mouth daily.     nitroGLYCERIN 0.4 MG SL tablet  Commonly known as:  NITROSTAT  Place 1 tablet (0.4 mg total) under the tongue every  5 (five) minutes as needed for chest pain.     potassium chloride 10 MEQ tablet  Commonly known as:  K-DUR  Take 2 tablets (20 mEq total) by mouth daily.     sacubitril-valsartan 49-51 MG  Commonly known as:  ENTRESTO  Take 1 tablet by mouth 2 (two) times daily.     spironolactone 25 MG tablet  Commonly known as:  ALDACTONE  Take 0.5 tablets (12.5 mg total) by mouth daily.     Umeclidinium-Vilanterol 62.5-25 MCG/INH Aepb  Inhale 1 puff into the lungs daily.     VITAMIN D-3 PO  Take 2,000 Units by mouth daily.        Discharge Instructions    (HEART FAILURE PATIENTS) Call MD:  Anytime you have any of the following symptoms: 1) 3 pound weight gain in 24 hours or 5 pounds in 1 week 2) shortness of breath, with or without a dry hacking cough 3) swelling in the hands, feet or stomach 4) if you have to sleep on extra pillows at night in order to breathe.    Complete by:  As directed      Diet - low sodium heart healthy    Complete by:  As directed      Increase activity slowly    Complete by:  As directed           Follow-up Information    Follow up with [facility] On 04/28/2015.   Why:  at 4:30 for wound check   Contact information:   [place] [address] [place] [address]       Follow up with Patsey Berthold, NP On 06/06/2015.   Specialty:  Nurse Practitioner   Why:  at [facility] information:   [place] [address] [phone]       [place] UP APPOINTMENTS  Time spent with patient to include physician time: 36 min Signed: Rosaria Ferries, PA-C 04/16/2015, 11:55 AM Co-Sign MD  EP Attending  Patient seen and examined. Agree with above. [place] for discharge.  Mikle Bosworth.D.

## 2015-04-16 NOTE — Progress Notes (Signed)
Patient ID: SHANTI AGRESTI, female   DOB: August 05, 1956, 59 y.o.   MRN: [id number] ICD Criteria  Current LVEF:30% ;Obtained > or = 1 month ago and < or = 3 months ago.  NYHA Functional Classification: Class III  Heart Failure History:  Yes, Duration of heart failure since onset is > 9 months  Non-Ischemic Dilated Cardiomyopathy History:  Yes, timeframe is > 9 months  Atrial Fibrillation/Atrial Flutter:  No.  Ventricular Tachycardia History:  No.  Cardiac Arrest History:  No  History of Syndromes with Risk of Sudden Death:  No.  Previous ICD:  No.  Electrophysiology Study: No.  Prior MI: No.  PPM: No.  OSA:  No  Patient Life Expectancy of >=1 year: Yes.  Anticoagulation Therapy:  Patient is NOT on anticoagulation therapy.   Beta Blocker Therapy:  Yes.   Ace Inhibitor/ARB Therapy:  Yes.

## 2015-04-16 NOTE — Discharge Instructions (Signed)
Supplemental Discharge Instructions for  Pacemaker/Defibrillator Patients  Activity No heavy lifting or vigorous activity with your left/right arm for 6 to 8 weeks.  Do not raise your left/right arm above your head for one week.  Gradually raise your affected arm as drawn below.                       06/14                   06/15                      06/16                       06/17            NO DRIVING for  one week    ; you may begin driving on     02/63     . WOUND CARE - Keep the wound area clean and dry.  Do not get this area wet for one week. No showers for one week; you may shower on      06/18        . - The tape/steri-strips on your wound will fall off; do not pull them off.  No bandage is needed on the site.  DO  NOT apply any creams, oils, or ointments to the wound area. - If you notice any drainage or discharge from the wound, any swelling or bruising at the site, or you develop a fever > 101? F after you are discharged home, call the office at once.  Special Instructions - You are still able to use cellular telephones; use the ear opposite the side where you have your pacemaker/defibrillator.  Avoid carrying your cellular phone near your device. - When traveling through airports, show security personnel your identification card to avoid being screened in the metal detectors.  Ask the security personnel to use the hand wand. - Avoid arc welding equipment, MRI testing (magnetic resonance imaging), TENS units (transcutaneous nerve stimulators).  Call the office for questions about other devices. - Avoid electrical appliances that are in poor condition or are not properly grounded. - Microwave ovens are safe to be near or to operate.  Additional information for defibrillator patients should your device go off: - If your device goes off ONCE and you feel fine afterward, notify the device clinic nurses. - If your device goes off ONCE and you do not feel well afterward, call  911. - If your device goes off TWICE, call 911. - If your device goes off THREE times in one day, call 911.  DO NOT DRIVE YOURSELF OR A FAMILY MEMBER WITH A DEFIBRILLATOR TO THE HOSPITAL--CALL 911.   Diabetes Mellitus and Food It is important for you to manage your blood sugar (glucose) level. Your blood glucose level can be greatly affected by what you eat. Eating healthier foods in the appropriate amounts throughout the day at about the same time each day will help you control your blood glucose level. It can also help slow or prevent worsening of your diabetes mellitus. Healthy eating may even help you improve the level of your blood pressure and reach or maintain a healthy weight.  HOW CAN FOOD AFFECT ME? Carbohydrates Carbohydrates affect your blood glucose level more than any other type of food. Your dietitian will help you determine how many carbohydrates to eat at each meal and  teach you how to count carbohydrates. Counting carbohydrates is important to keep your blood glucose at a healthy level, especially if you are using insulin or taking certain medicines for diabetes mellitus. Alcohol Alcohol can cause sudden decreases in blood glucose (hypoglycemia), especially if you use insulin or take certain medicines for diabetes mellitus. Hypoglycemia can be a life-threatening condition. Symptoms of hypoglycemia (sleepiness, dizziness, and disorientation) are similar to symptoms of having too much alcohol.  If your health care provider has given you approval to drink alcohol, do so in moderation and use the following guidelines:  Women should not have more than one drink per day, and men should not have more than two drinks per day. One drink is equal to:  12 oz of beer.  5 oz of wine.  1 oz of hard liquor.  Do not drink on an empty stomach.  Keep yourself hydrated. Have water, diet soda, or unsweetened iced tea.  Regular soda, juice, and other mixers might contain a lot of  carbohydrates and should be counted. WHAT FOODS ARE NOT RECOMMENDED? As you make food choices, it is important to remember that all foods are not the same. Some foods have fewer nutrients per serving than other foods, even though they might have the same number of calories or carbohydrates. It is difficult to get your body what it needs when you eat foods with fewer nutrients. Examples of foods that you should avoid that are high in calories and carbohydrates but low in nutrients include:  Trans fats (most processed foods list trans fats on the Nutrition Facts label).  Regular soda.  Juice.  Candy.  Sweets, such as cake, pie, doughnuts, and cookies.  Fried foods. WHAT FOODS CAN I EAT? Have nutrient-rich foods, which will nourish your body and keep you healthy. The food you should eat also will depend on several factors, including:  The calories you need.  The medicines you take.  Your weight.  Your blood glucose level.  Your blood pressure level.  Your cholesterol level. You also should eat a variety of foods, including:  Protein, such as meat, poultry, fish, tofu, nuts, and seeds (lean animal proteins are best).  Fruits.  Vegetables.  Dairy products, such as milk, cheese, and yogurt (low fat is best).  Breads, grains, pasta, cereal, rice, and beans.  Fats such as olive oil, trans fat-free margarine, canola oil, avocado, and olives. DOES EVERYONE WITH DIABETES MELLITUS HAVE THE SAME MEAL PLAN? Because every person with diabetes mellitus is different, there is not one meal plan that works for everyone. It is very important that you meet with a dietitian who will help you create a meal plan that is just right for you. Document Released: 07/19/2005 Document Revised: 10/27/2013 Document Reviewed: 09/18/2013 [facility] Patient Information 2015 [place], [place]. This information is not intended to replace advice given to you by your health care provider. Make sure you discuss any  questions you have with your health care provider.   Cardiac Diet This diet can help prevent heart disease and stroke. Many factors influence your heart health, including eating and exercise habits. Coronary risk rises a lot with abnormal blood fat (lipid) levels. Cardiac meal planning includes limiting unhealthy fats, increasing healthy fats, and making other small dietary changes. General guidelines are as follows:  Adjust calorie intake to reach and maintain desirable body weight.  Limit total fat intake to less than 30% of total calories. Saturated fat should be less than 7% of calories.  Saturated fats  are found in animal products and in some vegetable products. Saturated vegetable fats are found in coconut oil, cocoa butter, palm oil, and palm kernel oil. Read labels carefully to avoid these products as much as possible. Use butter in moderation. Choose tub margarines and oils that have 2 grams of fat or less. Good cooking oils are canola and olive oils.  Practice low-fat cooking techniques. Do not fry food. Instead, broil, bake, boil, steam, grill, roast on a rack, stir-fry, or microwave it. Other fat reducing suggestions include:  Remove the skin from poultry.  Remove all visible fat from meats.  Skim the fat off stews, soups, and gravies before serving them.  Steam vegetables in water or broth instead of sauting them in fat.  Avoid foods with trans fat (or hydrogenated oils), such as commercially fried foods and commercially baked goods. Commercial shortening and deep-frying fats will contain trans fat.  Increase intake of fruits, vegetables, whole grains, and legumes to replace foods high in fat.  Increase consumption of nuts, legumes, and seeds to at least 4 servings weekly. One serving of a legume equals  cup, and 1 serving of nuts or seeds equals  cup.  Choose whole grains more often. Have 3 servings per day (a serving is 1 ounce [oz]).  Eat 4 to 5 servings of vegetables  per day. A serving of vegetables is 1 cup of raw leafy vegetables;  cup of raw or cooked cut-up vegetables;  cup of vegetable juice.  Eat 4 to 5 servings of fruit per day. A serving of fruit is 1 medium whole fruit;  cup of dried fruit;  cup of fresh, frozen, or canned fruit;  cup of 100% fruit juice.  Increase your intake of dietary fiber to 20 to 30 grams per day. Insoluble fiber may help lower your risk of heart disease and may help curb your appetite.  Soluble fiber binds cholesterol to be removed from the blood. Foods high in soluble fiber are dried beans, citrus fruits, oats, apples, bananas, broccoli, Brussels sprouts, and eggplant.  Try to include foods fortified with plant sterols or stanols, such as yogurt, breads, juices, or margarines. Choose several fortified foods to achieve a daily intake of 2 to 3 grams of plant sterols or stanols.  Foods with omega-3 fats can help reduce your risk of heart disease. Aim to have a 3.5 oz portion of fatty fish twice per week, such as salmon, mackerel, albacore tuna, sardines, lake trout, or herring. If you wish to take a fish oil supplement, choose one that contains 1 gram of both DHA and EPA.  Limit processed meats to 2 servings (3 oz portion) weekly.  Limit the sodium in your diet to 1500 milligrams (mg) per day. If you have high blood pressure, talk to a registered dietitian about a DASH (Dietary Approaches to Stop Hypertension) eating plan.  Limit sweets and beverages with added sugar, such as soda, to no more than 5 servings per week. One serving is:   1 tablespoon sugar.  1 tablespoon jelly or jam.   cup sorbet.  1 cup lemonade.   cup regular soda. CHOOSING FOODS Starches  Allowed: Breads: All kinds (wheat, rye, raisin, white, oatmeal, [place], [place], and English muffin bread). Low-fat rolls: English muffins, frankfurter and hamburger buns, bagels, pita bread, tortillas (not fried). Pancakes, waffles, biscuits, and muffins  made with recommended oil.  Avoid: Products made with saturated or trans fats, oils, or whole milk products. Butter rolls, cheese breads, croissants. Chiropractor  doughnuts, muffins, sweet rolls, biscuits, waffles, pancakes, store-bought mixes. Crackers  Allowed: Low-fat crackers and snacks: Animal, graham, rye, saltine (with recommended oil, no lard), oyster, and matzo crackers. Bread sticks, melba toast, rusks, flatbread, pretzels, and light popcorn.  Avoid: High-fat crackers: cheese crackers, butter crackers, and those made with coconut, palm oil, or trans fat (hydrogenated oils). Buttered popcorn. Cereals  Allowed: Hot or cold whole-grain cereals.  Avoid: Cereals containing coconut, hydrogenated vegetable fat, or animal fat. Potatoes / Pasta / Rice  Allowed: All kinds of potatoes, rice, and pasta (such as macaroni, spaghetti, and noodles).  Avoid: Pasta or rice prepared with cream sauce or high-fat cheese. Chow mein noodles, [place] fries. Vegetables  Allowed: All vegetables and vegetable juices.  Avoid: Fried vegetables. Vegetables in cream, butter, or high-fat cheese sauces. Limit coconut. Fruit in cream or custard. Protein  Allowed: Limit your intake of meat, seafood, and poultry to no more than 6 oz (cooked weight) per day. All lean, well-trimmed beef, veal, pork, and lamb. All chicken and [place] without skin. All fish and shellfish. Wild game: wild duck, rabbit, pheasant, and venison. Egg whites or low-cholesterol egg substitutes may be used as desired. Meatless dishes: recipes with dried beans, peas, lentils, and tofu (soybean curd). Seeds and nuts: all seeds and most nuts.  Avoid: Prime grade and other heavily marbled and fatty meats, such as short ribs, spare ribs, rib eye roast or steak, frankfurters, sausage, bacon, and high-fat luncheon meats, mutton. Caviar. Commercially fried fish. Domestic duck, goose, venison sausage. Organ meats: liver, gizzard, heart, chitterlings,  brains, kidney, sweetbreads. Dairy  Allowed: Low-fat cheeses: nonfat or low-fat cottage cheese (1% or 2% fat), cheeses made with part skim milk, such as mozzarella, farmers, string, or ricotta. (Cheeses should be labeled no more than 2 to 6 grams fat per oz.). Skim (or 1%) milk: liquid, powdered, or evaporated. Buttermilk made with low-fat milk. Drinks made with skim or low-fat milk or cocoa. Chocolate milk or cocoa made with skim or low-fat (1%) milk. Nonfat or low-fat yogurt.  Avoid: Whole milk cheeses, including colby, cheddar, [place], Monterey Jack, [place], [place], [place], American, Swiss, and blue. Creamed cottage cheese, cream cheese. Whole milk and whole milk products, including buttermilk or yogurt made from whole milk, drinks made from whole milk. Condensed milk, evaporated whole milk, and 2% milk. Soups and Combination Foods  Allowed: Low-fat low-sodium soups: broth, dehydrated soups, homemade broth, soups with the fat removed, homemade cream soups made with skim or low-fat milk. Low-fat spaghetti, lasagna, chili, and Spanish rice if low-fat ingredients and low-fat cooking techniques are used.  Avoid: Cream soups made with whole milk, cream, or high-fat cheese. All other soups. Desserts and Sweets  Allowed: Sherbet, fruit ices, gelatins, meringues, and angel food cake. Homemade desserts with recommended fats, oils, and milk products. Jam, jelly, honey, marmalade, sugars, and syrups. Pure sugar candy, such as gum drops, hard candy, jelly beans, marshmallows, mints, and small amounts of dark chocolate.  Avoid: Commercially prepared cakes, pies, cookies, frosting, pudding, or mixes for these products. Desserts containing whole milk products, chocolate, coconut, lard, palm oil, or palm kernel oil. Ice cream or ice cream drinks. Candy that contains chocolate, coconut, butter, hydrogenated fat, or unknown ingredients. Buttered syrups. Fats and Oils  Allowed: Vegetable oils: safflower,  sunflower, corn, soybean, cottonseed, sesame, canola, olive, or peanut. Non-hydrogenated margarines. Salad dressing or mayonnaise: homemade or commercial, made with a recommended oil. Low or nonfat salad dressing or mayonnaise.  Limit added fats and oils to  6 to 8 tsp per day (includes fats used in cooking, baking, salads, and spreads on bread). Remember to count the "hidden fats" in foods.  Avoid: Solid fats and shortenings: butter, lard, salt pork, bacon drippings. Gravy containing meat fat, shortening, or suet. Cocoa butter, coconut. Coconut oil, palm oil, palm kernel oil, or hydrogenated oils: these ingredients are often used in bakery products, nondairy creamers, whipped toppings, candy, and commercially fried foods. Read labels carefully. Salad dressings made of unknown oils, sour cream, or cheese, such as blue cheese and Roquefort. Cream, all kinds: half-and-half, light, heavy, or whipping. Sour cream or cream cheese (even if "light" or low-fat). Nondairy cream substitutes: coffee creamers and sour cream substitutes made with palm, palm kernel, hydrogenated oils, or coconut oil. Beverages  Allowed: Coffee (regular or decaffeinated), tea. Diet carbonated beverages, mineral water. Alcohol: Check with your caregiver. Moderation is recommended.  Avoid: Whole milk, regular sodas, and juice drinks with added sugar. Condiments  Allowed: All seasonings and condiments. Cocoa powder. "Cream" sauces made with recommended ingredients.  Avoid: Carob powder made with hydrogenated fats. SAMPLE MENU Breakfast   cup orange juice   cup oatmeal  1 slice toast  1 tsp margarine  1 cup skim milk Lunch  [place] sandwich with 2 oz [place], 2 slices bread  Lettuce and tomato slices  Fresh fruit  Carrot sticks  Coffee or tea Snack  Fresh fruit or low-fat crackers Dinner  3 oz lean ground beef  1 baked potato  1 tsp margarine   cup asparagus  Lettuce salad  1 tbs non-creamy  dressing   cup peach slices  1 cup skim milk Document Released: 07/31/2008 Document Revised: 04/22/2012 Document Reviewed: 12/22/2013 [employer] Patient Information 2015 [place], [place]. This information is not intended to replace advice given to you by your health care provider. Make sure you discuss any questions you have with your health care provider.

## 2015-04-18 ENCOUNTER — Ambulatory Visit (HOSPITAL_BASED_OUTPATIENT_CLINIC_OR_DEPARTMENT_OTHER): Payer: 59 | Attending: Cardiology | Admitting: Radiology

## 2015-04-18 VITALS — Ht 62.0 in | Wt 167.0 lb

## 2015-04-18 DIAGNOSIS — G4733 Obstructive sleep apnea (adult) (pediatric): Secondary | ICD-10-CM

## 2015-04-18 DIAGNOSIS — R0683 Snoring: Secondary | ICD-10-CM | POA: Diagnosis not present

## 2015-04-18 DIAGNOSIS — G4719 Other hypersomnia: Secondary | ICD-10-CM | POA: Diagnosis present

## 2015-04-18 MED FILL — Cefazolin Sodium for IV Soln 2 GM and Dextrose 3% (50 ML): INTRAVENOUS | Qty: 50 | Status: AC

## 2015-04-22 ENCOUNTER — Telehealth (HOSPITAL_COMMUNITY): Payer: Self-pay | Admitting: *Deleted

## 2015-04-22 NOTE — Telephone Encounter (Signed)
Pt's FMLA forms completed, signed by Dr Aundra Dubin and faxed to her company at [phone]

## 2015-04-26 ENCOUNTER — Telehealth: Payer: Self-pay | Admitting: Cardiology

## 2015-04-26 NOTE — Telephone Encounter (Signed)
Please let patient know that she overall has mild OSA but it is severe during REM sleep with signficant oxygen desaturations.  Please set up for CPAP titration.

## 2015-04-26 NOTE — Sleep Study (Signed)
NAME: Stacy Hernandez DATE OF BIRTH:  06/27/1956 MEDICAL RECORD NUMBER [id number]  LOCATION: [facility]  PHYSICIAN: Maddoxx Burkitt R  DATE OF STUDY: 04/18/2015  SLEEP STUDY TYPE: Nocturnal Polysomnogram               REFERRING PHYSICIAN: Larey Dresser, MD  INDICATION FOR STUDY: Snoring, excessive daytime fatigue and witnessed apnea  EPWORTH SLEEPINESS SCORE: 11 HEIGHT: 5\' 2"  (157.5 cm)  WEIGHT: 167 lb (75.751 kg)    Body mass index is 30.54 kg/(m^2).  NECK SIZE: 14 in.  MEDICATIONS: Reviewed in the chart  SLEEP ARCHITECTURE: The patient slept for a total of 261 minutes out of a total sleep period time of 340 minutes.  There was 9 minutes of slow wave sleep and 35 minutes of REM sleep.  The onset to sleep latency was normal at 16 minutes and the onset to REM sleep latency was prolonged at 239 minutes.  The sleep efficiency was reduced at 73%.    RESPIRATORY DATA: The patient had 1 central apnea and 28 hypopneas.  Most events occurred during REM sleep in the supine position.  The AHI was 6.7 events per hour consistent with minimal obstructive sleep apnea/hypopnea syndrome.  There was mild snoring.  OXYGEN DATA: The average oxygen saturation was 91%.  The lowest oxygen saturation was 80%.  The time spent with oxygen saturations < 88% was 11.7 minutes.    CARDIAC DATA: The patient maintained NSR with intermittent V paced rhythm with PVC's.  The average heart rate was 61 bpm.  The lowest heart rate was 30 bpm.     MOVEMENT/PARASOMNIA: There were minimal periodic limb movements with a PLMS index of 0.9 events per hour.  There were no REM behavior sleep disorders.  IMPRESSION/ RECOMMENDATION:   1.  Mild obstructive sleep apnea/hypopnea syndrome with an AHI of 6.7 events per hour. The RDI was elevated at 29.6 events per hour.  The AHI during REM sleep was severe at 36 events per hour.   Most events occurred during REM sleep in the supine position. 2.  Mild snoring  was noted during the study.   3.  Significant oxygen desaturations were noted with the lowest being 80%.  The time spent with oxygen saturations < 88% was 11.7 minutes.   4.  Reduced sleep efficiency with increased frequency of arousals due to mainly spontaneous arousals.   5.  Abnormal sleep architecture with prolonged latency to REM sleep.   6.  The patient has severe sleep apnea during REM sleep but overall mild OSA and significant upper airway resistance as indicated by a high RDI.  Given the patient's significant oxygen desaturations, elevated Epworth sleepiness scale and excessive daytime sleepiness and snoring, recommend proceeding with CPAP titration.   7. Treatment would also include careful attention to proper sleep hygiene, weight reduction for elevated BMI, avoidance of sleeping in the supine position and avoidance of alcohol within four hours of bedtime.  Specific treatment decisions should be tailored to each patient based upon the clinical situation and all treatment options should be considered.  The patient should be instructed to avoid driving if sleepy and careful clinical follow up is needed to ensure that the patient's symptoms are improving with therapy and the PAP adherence is supported and measured if prescribed.    Signed:  Sueanne Margarita Diplomate, American Board of Sleep Medicine  ELECTRONICALLY SIGNED ON:  04/26/2015, 6:29 PM [place] PH: [phone]   FX: [phone] ACCREDITED BY THE AMERICAN ACADEMY OF SLEEP MEDICINE  

## 2015-04-27 ENCOUNTER — Other Ambulatory Visit: Payer: Self-pay | Admitting: *Deleted

## 2015-04-27 ENCOUNTER — Encounter: Payer: Self-pay | Admitting: *Deleted

## 2015-04-27 DIAGNOSIS — G4733 Obstructive sleep apnea (adult) (pediatric): Secondary | ICD-10-CM

## 2015-04-27 NOTE — Telephone Encounter (Signed)
Patient is aware of results. Titration is scheduled for 07/17/15 with patient added to cancellation list.   Letter has been sent as confirmation to patient.

## 2015-04-28 ENCOUNTER — Ambulatory Visit (INDEPENDENT_AMBULATORY_CARE_PROVIDER_SITE_OTHER): Payer: 59 | Admitting: *Deleted

## 2015-04-28 ENCOUNTER — Encounter: Payer: Self-pay | Admitting: Internal Medicine

## 2015-04-28 ENCOUNTER — Encounter: Payer: Self-pay | Admitting: *Deleted

## 2015-04-28 DIAGNOSIS — I429 Cardiomyopathy, unspecified: Secondary | ICD-10-CM

## 2015-04-28 DIAGNOSIS — I5022 Chronic systolic (congestive) heart failure: Secondary | ICD-10-CM | POA: Diagnosis not present

## 2015-04-28 DIAGNOSIS — Z95810 Presence of automatic (implantable) cardiac defibrillator: Secondary | ICD-10-CM

## 2015-04-29 LAB — CUP PACEART INCLINIC DEVICE CHECK
Battery Remaining Longevity: 98 mo
Battery Voltage: 3.08 V
Brady Statistic AP VP Percent: 34.73 %
Brady Statistic AP VS Percent: 0.04 %
Brady Statistic AS VP Percent: 65.16 %
Brady Statistic AS VS Percent: 0.08 %
Brady Statistic RA Percent Paced: 34.76 %
Brady Statistic RV Percent Paced: 99.8 %
Date Time Interrogation Session: 20160623203714
HighPow Impedance: 171 Ohm
HighPow Impedance: 57 Ohm
Lead Channel Impedance Value: 361 Ohm
Lead Channel Impedance Value: 456 Ohm
Lead Channel Impedance Value: 456 Ohm
Lead Channel Impedance Value: 513 Ohm
Lead Channel Impedance Value: 646 Ohm
Lead Channel Pacing Threshold Amplitude: 0.375 V
Lead Channel Pacing Threshold Amplitude: 0.625 V
Lead Channel Pacing Threshold Amplitude: 1.125 V
Lead Channel Pacing Threshold Pulse Width: 0.4 ms
Lead Channel Pacing Threshold Pulse Width: 0.4 ms
Lead Channel Pacing Threshold Pulse Width: 0.4 ms
Lead Channel Sensing Intrinsic Amplitude: 14 mV
Lead Channel Sensing Intrinsic Amplitude: 17 mV
Lead Channel Sensing Intrinsic Amplitude: 6.25 mV
Lead Channel Sensing Intrinsic Amplitude: 7 mV
Lead Channel Setting Pacing Amplitude: 2 V
Lead Channel Setting Pacing Amplitude: 3.5 V
Lead Channel Setting Pacing Amplitude: 3.5 V
Lead Channel Setting Pacing Pulse Width: 0.4 ms
Lead Channel Setting Pacing Pulse Width: 0.4 ms
Lead Channel Setting Sensing Sensitivity: 0.3 mV
Zone Setting Detection Interval: 300 ms
Zone Setting Detection Interval: 350 ms
Zone Setting Detection Interval: 360 ms
Zone Setting Detection Interval: 450 ms

## 2015-04-29 NOTE — Progress Notes (Signed)
CRT-D Wound check appointment. Steri-strips removed. Wound without redness or edema. Incision edges approximated, wound well healed. Normal device function. Thresholds, sensing, and impedances consistent with implant measurements. Device programmed at 3.5V for extra safety margin until 3 month visit. Histogram distribution appropriate for patient and level of activity. Changed LV output from 2.75 to 2.0V. Changed LV safety margin from 1.5 to 1.0V. BiVpacing 99.8% No mode switches or ventricular arrhythmias noted. Patient educated about wound care, arm mobility, lifting restrictions, shock plan. ROV AS 06/06/15.

## 2015-05-05 ENCOUNTER — Encounter (HOSPITAL_COMMUNITY): Payer: Self-pay

## 2015-05-05 ENCOUNTER — Ambulatory Visit (HOSPITAL_COMMUNITY)
Admission: RE | Admit: 2015-05-05 | Discharge: 2015-05-05 | Disposition: A | Discharge status: Home / Self Care | Payer: 59 | Source: Ambulatory Visit | Attending: Cardiology | Admitting: Cardiology

## 2015-05-05 VITALS — BP 116/76 | HR 61 | Wt 171.2 lb

## 2015-05-05 DIAGNOSIS — Z7982 Long term (current) use of aspirin: Secondary | ICD-10-CM | POA: Diagnosis not present

## 2015-05-05 DIAGNOSIS — E785 Hyperlipidemia, unspecified: Secondary | ICD-10-CM | POA: Diagnosis not present

## 2015-05-05 DIAGNOSIS — Z87891 Personal history of nicotine dependence: Secondary | ICD-10-CM | POA: Diagnosis not present

## 2015-05-05 DIAGNOSIS — I10 Essential (primary) hypertension: Secondary | ICD-10-CM | POA: Diagnosis not present

## 2015-05-05 DIAGNOSIS — G4733 Obstructive sleep apnea (adult) (pediatric): Secondary | ICD-10-CM | POA: Diagnosis not present

## 2015-05-05 DIAGNOSIS — I429 Cardiomyopathy, unspecified: Secondary | ICD-10-CM | POA: Insufficient documentation

## 2015-05-05 DIAGNOSIS — I5022 Chronic systolic (congestive) heart failure: Secondary | ICD-10-CM

## 2015-05-05 DIAGNOSIS — Z79899 Other long term (current) drug therapy: Secondary | ICD-10-CM | POA: Diagnosis not present

## 2015-05-05 DIAGNOSIS — E039 Hypothyroidism, unspecified: Secondary | ICD-10-CM | POA: Insufficient documentation

## 2015-05-05 DIAGNOSIS — J449 Chronic obstructive pulmonary disease, unspecified: Secondary | ICD-10-CM | POA: Diagnosis not present

## 2015-05-05 DIAGNOSIS — Z95810 Presence of automatic (implantable) cardiac defibrillator: Secondary | ICD-10-CM

## 2015-05-05 MED ORDER — SPIRONOLACTONE 25 MG PO TABS: 12.5000 mg | ORAL_TABLET | Freq: Every day | ORAL | Status: DC

## 2015-05-05 NOTE — Patient Instructions (Signed)
STOP Potassium.  Start Spironolactone 12.5 mg (1/2 tablet) once daily.  Will schedule you for a Cardiopulmonary exercise test. Wear comfortable clothes and shoes. Avoid tobacco, caffeine, and alcohol products 12 hrs before test. Avoid heavy meal before test (light meal/snacks suggested).  Return in 1-2 weeks for lab work.  Follow up 2 months.  Do the following things EVERYDAY: 1) Weigh yourself in the morning before breakfast. Write it down and keep it in a log. 2) Take your medicines as prescribed 3) Eat low salt foods-Limit salt (sodium) to 2000 mg per day.  4) Stay as active as you can everyday 5) Limit all fluids for the day to less than 2 liters

## 2015-05-05 NOTE — Progress Notes (Signed)
Patient ID: Stacy Hernandez, female   DOB: Oct 27, 1956, 59 y.o.   MRN: [id number] PCP: Dr. Shelia Media EP: Dr Lovena Le Primary Heart Failure: Dr Aundra Dubin   59 yo with history of HTN, intermittent LBBB, and nonischemic cardiomyopathy presents for cardiology followup.  She was admitted with chest pain in 6/13.  ECG showed intermittent LBBB.  Echo showed EF 45-50%.  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    Developed CP and dyspnea. She was set up for echo and Cardiolite in 12/15.  Echo showed EF 10-15% (significant fall).  She had a Lexiscan Cardiolite with EF 25% and apical anteroseptal and apical fixed perfusion defect.  Therefore, she was sent for left and right heart catheterization in 1/16.  This showed mild elevation in LV and RV filling pressures and no significant CAD.  She was started on Lasix 20 mg daily.  Echo in 5/16 showed EF 20-25%, mildly dilated and mildly dysfunctional RV.  PFTs 4/16 showed severe obstruction and restriction.  She has seen Dr Melvyn Novas.  She is noted to have an elevated right hemidiaphragm.   She returns for HF follow up. Since the last visit she had medtronic CRT-D on 04/15/2015.  On 6/13 had sleep study with evidence of sleep apnea. Plans to start CPAP. Remains SOB with exertion.  Weight at home 171 pounds.  No chest pain.  No lightheadedness or syncope.  Currently not working. Considering disability.   Labs (12/13): LDL 80, HDL 46, K 4, creatinine 0.7 Labs (6/14): K 4, creatinine 0.8, LDL 73, HDL 45 Labs (12/15): K 4.2, creatinine 0.7, HCT 43.3, LDL 60, HDL 51, BNP 577, TSH normal Labs (2/16): K 4.2, creatinine 0.7 Labs (1/16): SPEP no M spike K 4.3 Creatinine 0.74  Labs (2/16): K 4.2, creatinine 0.78 Labs (02/23/2015) : K 4.2 Creatinine 0.90, BNP 339 Labs (04/13/2015): K 4.5 Creatinine 1.01   PMH: 1. HTN 2. Hyperlipidemia 3. COPD: Prior smoker.  PFTs (4/16) with FVC 37%, FEV1 36%, ratio 97%, TLC 67%, DLCO 32% => severe obstruction and restriction.  COPD + elevated right  hemidiaphragm.  4. Intermittent LBBB/IVCD 5. Hypothyroidism 6. Cardiomyopathy: Nonischemic.  Echo (6/13) with EF 45-50%, paradoxical septal motion.  LHC (6/13) with 40% D1 stenosis, 30% PLV stenosis.  Echo (12/15) with EF 10-15%, moderately dilated LV, diffuse hypokinesis, moderate MR.   Lexiscan Cardiolite (12/15) with EF 25%, diffuse hypokinesis, apical anteroseptal and apical fixed defect. LHC/RHC (1/16) with mean RA 8, PA 37/19, mean PCWP 19, CI 2.63, EF 30%, no significant CAD.  Cardiac MRI 12/20/2014: EF 24% Mod mitral regurg thought to be from dilated LV, normal RV size, no LGE.  Echo (5/16) with EF 20-25%, mildly dilated and mildly dysfunctional RV, mild MR.    7. 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  8. 04/15/2015 Medtronicn CRT-d  9. 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  Quit smoking in 1/16.    FH: Father CABG at 59   ROS: All systems reviewed and negative except as per HPI.   Current Outpatient Prescriptions  Medication Sig Dispense Refill  . aspirin 81 MG tablet Take 81 mg by mouth daily.    Marland Kitchen atorvastatin (LIPITOR) 10 MG tablet TAKE 1 TABLET BY MOUTH AT BEDTIME 30 tablet 6  . bisoprolol (ZEBETA) 5 MG tablet Take 1 tablet (5 mg total) by mouth daily. 30 tablet 3  . Cholecalciferol (VITAMIN D-3 PO) Take 2,000 Units by  mouth daily.     . furosemide (LASIX) 20 MG tablet Take 2 tablets (40 mg total) by mouth daily. 60 tablet 3  . HYDROcodone-acetaminophen (NORCO) 5-325 MG per tablet Take 1 tablet by mouth at bedtime.     . hydroxychloroquine (PLAQUENIL) 200 MG tablet Take 200 mg by mouth 2 (two) times daily.  5  . Inositol Niacinate (NIACIN FLUSH FREE) 500 MG CAPS Take 3 capsules by mouth daily.    Marland Kitchen levothyroxine (SYNTHROID, LEVOTHROID) 100 MCG tablet Take 100 mcg by mouth daily before breakfast.    . Multiple Vitamins-Minerals (MULTIVITAMIN WITH MINERALS) tablet Take 1 tablet by  mouth daily.    . nitroGLYCERIN (NITROSTAT) 0.4 MG SL tablet Place 1 tablet (0.4 mg total) under the tongue every 5 (five) minutes as needed for chest pain. 25 tablet 3  . potassium chloride (K-DUR) 10 MEQ tablet Take 2 tablets (20 mEq total) by mouth daily. 60 tablet 3  . sacubitril-valsartan (ENTRESTO) 49-51 MG Take 1 tablet by mouth 2 (two) times daily. 60 tablet 3  . Umeclidinium-Vilanterol 62.5-25 MCG/INH AEPB Inhale 1 puff into the lungs as needed.     Marland Kitchen spironolactone (ALDACTONE) 25 MG tablet Take 0.5 tablets (12.5 mg total) by mouth daily. (Patient not taking: Reported on 04/13/2015) 15 tablet 3   No current facility-administered medications for this encounter.    BP 116/76 mmHg  Pulse 61  Wt 171 lb 4 oz (77.678 kg)  SpO2 97% General: NAD Husband present Neck: JVP not elevated, no thyromegaly or thyroid nodule.  Lungs: Decreased breath sounds  CV: Nondisplaced PMI.  Heart regular S1/S2, no S3/S4, 1/6 SEM.  No peripheral edema.  No carotid bruit.  Normal pedal pulses. L upper chest scar intact no erythema or exudate.  Abdomen: Soft, nontender, no hepatosplenomegaly, mild distention.  Neurologic: Alert and oriented x 3.  Psych: Normal affect. Extremities: No clubbing or cyanosis.   Assessment/Plan: 1. Cardiomyopathy: Nonischemic cardiomyopathy. In the past, she had EF 45-50% with no significant CAD (2013).  Per Dr Aundra Dubin -- LBBB-related nonischemic cardiomyopathy.  However, in the fall of 2015 she developed exertional chest pain and dyspnea.  Echo and Cardiolite showed marked worsening of her LV systolic function (EF 16-10% by echo) and Cardiolite suggested possible prior MI.  Left heart cath in 1/16 showed no significant CAD.  RHC showed mildly elevated right and left heart filling pressures and normal CI. SPEP negative and no infiltrative disease on cardiac MRI.  Repeat echo in 5/16 with EF 20-25%.   NYHA class II-III symptoms.   She does not appear volume overloaded. Continue lasix    - Continue Lasix to 40 mg daily . Stop potassium today.    - Continue bisoprolol 5 mg daily. Will not titrate. Heart rate 61 bpm.  - Continue Entresto 49/51 mg bid.  - Add spironolactone 12.5 mg daily again today and I am stopping potassium. Apparently Insurance denied when she tried to start after last visit. Check BMET in 10 days  Does not need to return to work until she returns to the office for further evaluation. No lite duty available at her work place and she can only return full time when she can perform all duties at 100%. I am not convinced she will be able to return.    2. COPD:  PFTs showed severe COPD but also restriction from elevated right hemidiaphragm.  She has seen pulmonary.   3. OSA: She is sleepy and fatigued during the day.  Plan to  start CPAP 07/17/15. 4. S/P Medtronic CRT-D 04/15/2015 - Check optivol next visit.   Set up CPX. Check BMET in 10 days.    Follow up in 2 months with Dr Aundra Dubin .   CLEGG,AMY NP-C  05/05/2015

## 2015-05-06 ENCOUNTER — Encounter (HOSPITAL_COMMUNITY): Payer: Self-pay

## 2015-05-06 NOTE — Progress Notes (Signed)
[employer] faxed request of medical records from our office 04/06/15 - present. All records available faxed to return # [phone] as provided. Claim # [id number] Policy # [id number] Copy of request form scanned into patient's electronic medical records for reference.  Renee Pain

## 2015-05-11 ENCOUNTER — Telehealth (HOSPITAL_COMMUNITY): Payer: Self-pay | Admitting: Cardiology

## 2015-05-11 NOTE — Telephone Encounter (Signed)
Pt scheduled for cpx on 05/17/15 Per dr.mclean Cpt code [phone] With pts insurance- medicare/cigna No pre cert required

## 2015-05-17 ENCOUNTER — Ambulatory Visit (HOSPITAL_COMMUNITY)
Admission: RE | Admit: 2015-05-17 | Discharge: 2015-05-17 | Disposition: A | Discharge status: Home / Self Care | Payer: 59 | Source: Ambulatory Visit | Attending: Cardiology | Admitting: Cardiology

## 2015-05-17 ENCOUNTER — Ambulatory Visit (HOSPITAL_COMMUNITY): Payer: 59

## 2015-05-17 DIAGNOSIS — I5022 Chronic systolic (congestive) heart failure: Secondary | ICD-10-CM | POA: Insufficient documentation

## 2015-05-17 LAB — BASIC METABOLIC PANEL
Anion gap: 6 (ref 5–15)
BUN: 14 mg/dL (ref 6–20)
CO2: 29 mmol/L (ref 22–32)
Calcium: 10 mg/dL (ref 8.9–10.3)
Chloride: 104 mmol/L (ref 101–111)
Creatinine, Ser: 0.71 mg/dL (ref 0.44–1.00)
GFR calc Af Amer: >60 mL/min (ref >60)
GFR calc non Af Amer: >60 mL/min (ref >60)
Glucose, Bld: 80 mg/dL (ref 65–99)
Potassium: 4.7 mmol/L (ref 3.5–5.1)
Sodium: 139 mmol/L (ref 135–145)

## 2015-05-18 ENCOUNTER — Other Ambulatory Visit (HOSPITAL_COMMUNITY): Payer: Self-pay | Admitting: *Deleted

## 2015-05-18 DIAGNOSIS — I5022 Chronic systolic (congestive) heart failure: Secondary | ICD-10-CM

## 2015-05-28 ENCOUNTER — Other Ambulatory Visit (HOSPITAL_COMMUNITY): Payer: Self-pay | Admitting: Internal Medicine

## 2015-05-29 ENCOUNTER — Other Ambulatory Visit (HOSPITAL_COMMUNITY): Payer: Self-pay | Admitting: Cardiology

## 2015-05-30 NOTE — Telephone Encounter (Signed)
bensimhon refill. Thank you for your time. 

## 2015-06-02 ENCOUNTER — Encounter: Payer: Self-pay | Admitting: Internal Medicine

## 2015-06-02 ENCOUNTER — Ambulatory Visit (INDEPENDENT_AMBULATORY_CARE_PROVIDER_SITE_OTHER): Payer: 59 | Admitting: Internal Medicine

## 2015-06-02 VITALS — BP 132/88 | HR 61 | Ht 62.0 in | Wt 173.0 lb

## 2015-06-02 DIAGNOSIS — R0600 Dyspnea, unspecified: Secondary | ICD-10-CM | POA: Diagnosis not present

## 2015-06-02 DIAGNOSIS — E669 Obesity, unspecified: Secondary | ICD-10-CM | POA: Diagnosis not present

## 2015-06-02 DIAGNOSIS — J986 Disorders of diaphragm: Secondary | ICD-10-CM

## 2015-06-02 DIAGNOSIS — G4733 Obstructive sleep apnea (adult) (pediatric): Secondary | ICD-10-CM | POA: Diagnosis not present

## 2015-06-02 NOTE — Progress Notes (Signed)
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 Pulmonary Consult    Referred by Dr. Loralie Champagne. Pt c/o SOB for the past yr. She states that she gets SOB with "just about anything"- house work, walking short distances such as from room to room at home. She has occ non prod cough.  indolent onset progressive doe x 1 y worse esp since jan 2016 assoc with cough better since quit smoking and has developed sense of immediate orthopnea so sleeps on side. Min am cough productive of mucoid sputum only.    rec anoro one click each am Late add  CTa chest and Sniff test eval sob > paralyzed R HD    03/28/2015 f/u ov/Wert re: paralyzed R HD /on anoro no better  Chief Complaint  Patient presents with  . Follow-up    Pt states SOB and cough are unchanged since her last visit. No new co's today.    main concern is immediate orthopnea and doe x anytime she gets in a hurry or inclines/ steps rec Weight control is simply a Hernandez of calorie balance If you need a sleep specialist you should see one of our doctors due to the added problem of the diaphragm dysfunction which would be expected to be worse when sleeping. For now do not recommend  any follow up but in future (say 3-4 months)  may need to be referred to [facility] if not improving in terms of ex tolerance  With treatment of your heart problems per cardiology       06/02/2015 f/u ov/Wert re: obesity with R HD paralysis  Chief Complaint  Patient presents with  . Follow-up    Pt advised to return here per the request of Dr Aundra Dubin. She  had CPST 05/18/15. She states that her breathing is unchanged since the last visit here and she denies any new co's today.      No obvious day to day or daytime variabilty or assoc chronic cough or cp or chest tightness, subjective wheeze overt sinus or hb symptoms. No unusual exp hx or h/o childhood pna/ asthma or knowledge of premature birth.  Sleeping ok without nocturnal  or early am exacerbation  of respiratory  c/o's or need for noct saba. Also denies any obvious fluctuation of symptoms with weather or environmental changes or other aggravating or alleviating factors except as outlined above   Current Medications, Allergies, Complete Past Medical History, Past Surgical History, Family History, and Social History were reviewed in [employer] record.  ROS  The following are not active complaints unless bolded sore throat, dysphagia, dental problems, itching, sneezing,  nasal congestion or excess/ purulent secretions, ear ache,   fever, chills, sweats, unintended wt loss, pleuritic or exertional cp, hemoptysis,  orthopnea pnd or leg swelling, presyncope, palpitations, heartburn, abdominal pain, anorexia, nausea, vomiting, diarrhea  or change in bowel or urinary habits, change in stools or urine, dysuria,hematuria,  rash, arthralgias, visual complaints, headache, numbness weakness or ataxia or problems with walking or coordination,  change  in mood/affect or memory.                       Objective:   Physical Exam   amb wf nad  03/28/2015        167 > 06/02/2015   173  Wt Readings from Last 3 Encounters:  03/11/15 168 lb (76.204 kg)  02/09/15 167 lb 12.8 oz (76.114 kg)  12/20/14 169 lb (76.658 kg)    Vital signs reviewed   HEENT: nl dentition, turbinates, and orophanx. Nl external ear canals without cough reflex   NECK :  without JVD/Nodes/TM/ nl carotid upstrokes bilaterally   LUNGS: no acc muscle use, slt distant bs esp R base    CV:  RRR  no s3 or  murmur or increase in P2, no edema   ABD:  soft and nontender with nl excursion in the supine position. No bruits or organomegaly, bowel sounds nl  MS:  warm without deformities, calf tenderness, cyanosis or clubbing  SKIN: warm and dry without lesions    NEURO:  alert, approp, no deficits      I personally reviewed images and agree with radiology impression as follows:  CTa chest: 03/14/15 Negative for pulmonary embolism. Elevated right hemidiaphragm with right lower lobe atelectasis. Lungs otherwise clear.      Assessment & Plan:   Outpatient Encounter Prescriptions as of 06/02/2015  Medication Sig  . aspirin 81 MG tablet Take 81 mg by mouth daily.  Marland Kitchen atorvastatin (LIPITOR) 10 MG tablet TAKE 1 TABLET BY MOUTH AT BEDTIME  . bisoprolol (ZEBETA) 5 MG tablet Take 1 tablet (5 mg total) by mouth daily.  . Cholecalciferol (VITAMIN D-3 PO) Take 2,000 Units by mouth daily.   Marland Kitchen ENTRESTO 49-51 MG TAKE 1 TABLET BY MOUTH 2 (TWO) TIMES DAILY.  . furosemide (LASIX) 20 MG tablet TAKE 2 TABLETS (40 MG TOTAL) BY MOUTH DAILY.  Marland Kitchen HYDROcodone-acetaminophen (NORCO) 5-325 MG per tablet Take 1 tablet by mouth at bedtime.   . hydroxychloroquine (PLAQUENIL) 200 MG tablet Take 200 mg by mouth 2 (two) times daily.  . Inositol Niacinate (NIACIN FLUSH FREE) 500 MG CAPS Take 3 capsules by mouth daily.  Marland Kitchen KLOR-CON M10 10 MEQ tablet TAKE 2 TABLETS (20 MEQ TOTAL) BY MOUTH DAILY.  Marland Kitchen levothyroxine (SYNTHROID, LEVOTHROID) 100 MCG tablet Take 100 mcg by mouth daily before breakfast.  . Multiple Vitamins-Minerals (MULTIVITAMIN WITH MINERALS) tablet Take 1 tablet by mouth daily.  . nitroGLYCERIN (NITROSTAT) 0.4 MG SL tablet Place 1 tablet (0.4 mg total) under the tongue every 5 (five) minutes as needed for chest pain.  Marland Kitchen spironolactone (ALDACTONE) 25 MG tablet Take 0.5 tablets (12.5 mg total) by mouth daily.  . [DISCONTINUED] Umeclidinium-Vilanterol 62.5-25 MCG/INH AEPB Inhale 1 puff into the lungs as needed.    No  facility-administered encounter medications on file as of 06/02/2015.

## 2015-06-02 NOTE — Patient Instructions (Signed)
Weight control is simply a matter of calorie balance which needs to be tilted in your favor by eating less and exercising more.  To get the most out of exercise, you need to be continuously aware that you are short of breath, but never out of breath, for 30 minutes daily. As you improve, it will actually be easier for you to do the same amount of exercise  in  30 minutes so always push to the level where you are short of breath.  If this does not result in gradual weight reduction then I strongly recommend you see a nutritionist with a food diary x 2 weeks so that we can work out a negative calorie balance which is universally effective in steady weight loss programs.  Think of your calorie balance like you do your bank account where in this case you want the balance to go down so you must take in less calories than you burn up.  It's just that simple:  Hard to do, but easy to understand.  Good luck!   Please schedule a follow up visit in 3 months but call sooner if needed to see one of our sleep docs to review your cpap and consider for bipap

## 2015-06-05 ENCOUNTER — Encounter: Payer: Self-pay | Admitting: Nurse Practitioner

## 2015-06-05 NOTE — Progress Notes (Signed)
Electrophysiology Office Note Date: 06/06/2015  ID:  Stacy Hernandez, DOB [birth year], MRN [id number]  PCP: Baldemar Friday, FNP Primary Cardiologist: Aundra Dubin Electrophysiologist: Lovena Le  CC: 6 week post CRT implant  Stacy Hernandez is a 59 y.o. female seen today for Dr Lovena Le.  She underwent CRTD implantation 04/2015 and presents today for follow up. Since discharge, she has had sleep study which demonstrated sleep apnea.  She reports compliance with CPAP.  She also underwent CPX which demonstrated primarily a pulmonary limitation to exercise capacity and has seen Dr Melvyn Novas in follow up.  She states that since CRTD implantation, her HF symptoms have not noticeably improved.  She denies chest pain, palpitations, PND, orthopnea, nausea, vomiting, dizziness, syncope, edema, weight gain, or early satiety. She has chronic stable shortness of breath with exertion.  She has not had ICD shocks.   Device History: MDT CRTD implanted 2016 for non ischemic cardiomyopathy, CHF History of appropriate therapy: no History of AAD therapy: no   Past Medical History  Diagnosis Date  . Thyroid disease     Post-ablation hypothyroidism  . Hypertriglyceridemia   . Hypercalcemia     Due to HCTZ, improved with d/c of calcium supplements  . Nephrolithiasis   . Osteoporosis   . Tobacco abuse   . Back pain     Recent positive ANA but told she has OA  . Cardiomyopathy     a.  EF 45-50% by echo 04/23/2012;  b.  Echo (12/15):  EF 10-15%, Gr 2 DD, ventricular septum dyssynergy, mod MR, mod LAE.  Marland Kitchen CAD (coronary artery disease)     a.  per cath in June 2013 with nonobstructive CAD;  b.  Lexiscan Myoview (12/15):  No ischemia.  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 Hypertension   . AICD (automatic cardioverter/defibrillator) present 04/15/2015    Medtronic (serial Number [id number]) biventricular ICD  . COPD (chronic obstructive pulmonary disease)    . OA (osteoarthritis)   . Sleep apnea    Past Surgical History  Procedure Laterality Date  . Bladder surgery      bladder & bowel tac with mesh  . Partial hysterectomy      Due to cervical dysplasia  . Cesarean section    . Tonsillectomy    . Carpal tunnel release    . Left heart catheterization with coronary angiogram N/A 04/22/2012    Procedure: LEFT HEART CATHETERIZATION WITH CORONARY ANGIOGRAM;  Surgeon: Hillary Bow, MD;  Location: [facility] CATH LAB;  Service: Cardiovascular;  Laterality: N/A;  . Left and right heart catheterization with coronary angiogram N/A 11/12/2014    Procedure: LEFT AND RIGHT HEART CATHETERIZATION WITH CORONARY ANGIOGRAM;  Surgeon: Larey Dresser, MD;  Location: [facility] CATH LAB;  Service: Cardiovascular;  Laterality: N/A;  . Ep implantable device N/A 04/15/2015    Procedure: BiV ICD Insertion CRT-D;  Surgeon: Evans Lance, MD;  Medtronic (serial Number [id number]) biventricular ICD    Current Outpatient Prescriptions  Medication Sig Dispense Refill  . aspirin 81 MG tablet Take 81 mg by mouth daily.    Marland Kitchen atorvastatin (LIPITOR) 10 MG tablet TAKE 1 TABLET BY MOUTH AT BEDTIME 30 tablet 6  . bisoprolol (ZEBETA) 5 MG tablet Take 1 tablet (5 mg total) by mouth daily. 30 tablet 3  . Cholecalciferol (VITAMIN D-3 PO) Take 2,000 Units by mouth daily.     Marland Kitchen ENTRESTO 49-51 MG TAKE 1 TABLET BY  MOUTH 2 (TWO) TIMES DAILY. 60 tablet 3  . furosemide (LASIX) 20 MG tablet TAKE 2 TABLETS (40 MG TOTAL) BY MOUTH DAILY. 60 tablet 3  . HYDROcodone-acetaminophen (NORCO) 5-325 MG per tablet Take 1 tablet by mouth at bedtime.     . hydroxychloroquine (PLAQUENIL) 200 MG tablet Take 200 mg by mouth 2 (two) times daily.  5  . Inositol Niacinate (NIACIN FLUSH FREE) 500 MG CAPS Take 3 capsules by mouth daily.    Marland Kitchen KLOR-CON M10 10 MEQ tablet TAKE 2 TABLETS (20 MEQ TOTAL) BY MOUTH DAILY. 60 tablet 3  . levothyroxine (SYNTHROID, LEVOTHROID) 100 MCG tablet Take 100 mcg by mouth daily before  breakfast.    . Multiple Vitamins-Minerals (MULTIVITAMIN WITH MINERALS) tablet Take 1 tablet by mouth daily.    . nitroGLYCERIN (NITROSTAT) 0.4 MG SL tablet Place 1 tablet (0.4 mg total) under the tongue every 5 (five) minutes as needed for chest pain. 25 tablet 3  . spironolactone (ALDACTONE) 25 MG tablet Take 0.5 tablets (12.5 mg total) by mouth daily. 15 tablet 3   No current facility-administered medications for this visit.    Allergies:   Prednisone   Social History: History   Social History  . Marital Status: Married    Spouse Name: N/A  . Number of Children: N/A  . Years of Education: N/A   Occupational History  . Not on file.   Social History Main Topics  . Smoking status: Former Smoker -- 2.00 packs/day for 35 years    Types: Cigarettes    Quit date: 11/05/2014  . Smokeless tobacco: Never Used  . Alcohol Use: No  . Drug Use: No  . Sexual Activity: Not Currently   Other Topics Concern  . Not on file   Social History Narrative    Family History: Family History  Problem Relation Age of Onset  . Coronary artery disease Father     s/p CABG in his 64s  . Hypertension Father   . COPD Mother     smoked  . Colon polyps Mother   . Diabetes Sister   . Cirrhosis Sister   . Colon cancer Maternal Uncle 78  . Heart attack Father   . Stroke Neg Hx     Review of Systems: All other systems reviewed and are otherwise negative except as noted above.   Physical Exam: VS:  BP 110/70 mmHg  Pulse 60  Ht 5\' 2"  (1.575 m)  Wt 173 lb 9.6 oz (78.744 kg)  BMI 31.74 kg/m2 , BMI Body mass index is 31.74 kg/(m^2).  GEN- The patient is well appearing, alert and oriented x 3 today.   HEENT: normocephalic, atraumatic; sclera clear, conjunctiva pink; hearing intact; oropharynx clear; neck supple, no JVP Lymph- no cervical lymphadenopathy Lungs- Clear to ausculation bilaterally, decreased breath sounds on the right, normal work of breathing.  No wheezes, rales, rhonchi Heart-  Regular rate and rhythm, 1/6 SEM  GI- soft, non-tender, non-distended, bowel sounds present  Extremities- no clubbing, cyanosis, or edema; DP/PT/radial pulses 2+ bilaterally MS- no significant deformity or atrophy Skin- warm and dry, no rash or lesion; ICD pocket well healed Psych- euthymic mood, full affect Neuro- strength and sensation are intact  ICD interrogation- reviewed in detail today,  See PACEART report  EKG:  EKG is ordered today. The ekg ordered today shows AV pacing, QRS 144msec  Recent Labs: 11/04/2014: TSH 4.180 04/13/2015: B Natriuretic Peptide 98.1; Hemoglobin 12.7; Platelets 227 05/17/2015: BUN 14; Creatinine, Ser 0.71; Potassium 4.7; Sodium  139   Wt Readings from Last 3 Encounters:  06/06/15 173 lb 9.6 oz (78.744 kg)  06/02/15 173 lb (78.472 kg)  05/05/15 171 lb 4 oz (77.678 kg)     Other studies Reviewed: Additional studies/ records that were reviewed today include: hospital records, CPX test, HF notes  Assessment and Plan:  1.  Chronic systolic dysfunction/NICM euvolemic today Stable on an appropriate medical regimen Normal ICD function See Pace Art report Adaptive CRT pacing turned on today. QRS morphology different from immediate post procedure, will obtain CXR to evaluate lead position  2.  Sleep apnea Compliance with CPAP encouraged - she receives her equipment 9/11  3.  Obesity Weight loss encouraged Body mass index is 31.74 kg/(m^2).  4.  HTN Stable No change required today  5.  COPD Per pulmonary   Current medicines are reviewed at length with the patient today.   The patient does not have concerns regarding her medicines.  The following changes were made today:  none  Labs/ tests ordered today include: CXR   Disposition:   Follow up with Dr Lovena Le in 6 weeks, follow up with Dr Aundra Dubin as scheduled.  Will defer to Dr Aundra Dubin if he would like for me to do AV optimization echo after he sees her later this month.   Signed, Chanetta Marshall,  NP 06/06/2015 9:15 AM  [place] [address] [phone] (office) [phone] (fax)

## 2015-06-06 ENCOUNTER — Encounter: Payer: Self-pay | Admitting: Nurse Practitioner

## 2015-06-06 ENCOUNTER — Encounter: Payer: Self-pay | Admitting: Internal Medicine

## 2015-06-06 ENCOUNTER — Ambulatory Visit (INDEPENDENT_AMBULATORY_CARE_PROVIDER_SITE_OTHER): Payer: 59 | Admitting: Nurse Practitioner

## 2015-06-06 ENCOUNTER — Ambulatory Visit (INDEPENDENT_AMBULATORY_CARE_PROVIDER_SITE_OTHER)
Admission: RE | Admit: 2015-06-06 | Discharge: 2015-06-06 | Disposition: A | Discharge status: Home / Self Care | Payer: 59 | Source: Ambulatory Visit | Attending: Nurse Practitioner | Admitting: Nurse Practitioner

## 2015-06-06 VITALS — BP 110/70 | HR 60 | Ht 62.0 in | Wt 173.6 lb

## 2015-06-06 DIAGNOSIS — G4730 Sleep apnea, unspecified: Secondary | ICD-10-CM

## 2015-06-06 DIAGNOSIS — I429 Cardiomyopathy, unspecified: Secondary | ICD-10-CM

## 2015-06-06 DIAGNOSIS — I5022 Chronic systolic (congestive) heart failure: Secondary | ICD-10-CM

## 2015-06-06 DIAGNOSIS — I10 Essential (primary) hypertension: Secondary | ICD-10-CM

## 2015-06-06 DIAGNOSIS — I428 Other cardiomyopathies: Secondary | ICD-10-CM

## 2015-06-06 LAB — CUP PACEART INCLINIC DEVICE CHECK
Date Time Interrogation Session: 20160801094049
Lead Channel Setting Pacing Amplitude: 2 V
Lead Channel Setting Pacing Amplitude: 3.5 V
Lead Channel Setting Pacing Amplitude: 3.5 V
Lead Channel Setting Pacing Pulse Width: 0.4 ms
Lead Channel Setting Pacing Pulse Width: 0.4 ms
Lead Channel Setting Sensing Sensitivity: 0.3 mV
Zone Setting Detection Interval: 300 ms
Zone Setting Detection Interval: 350 ms
Zone Setting Detection Interval: 360 ms
Zone Setting Detection Interval: 450 ms

## 2015-06-06 NOTE — Patient Instructions (Addendum)
Medication Instructions:   Your physician recommends that you continue on your current medications as directed. Please refer to the Current Medication list given to you today.  Labwork:   Testing/Procedures:  A chest x-ray takes a picture of the organs and structures inside the chest, including the heart, lungs, and blood vessels.  AT [address]Marland KitchenThis test can show several things, including, whether the heart is enlarges; whether fluid is building up in the lungs; and whether pacemaker / defibrillator leads are still in place.    Follow-Up:  WITH DR Lovena Le IN 6 WEEKS PHYS DEFIB CHECK    Any Other Special Instructions Will Be Listed Below (If Applicable).

## 2015-06-07 ENCOUNTER — Encounter: Payer: Self-pay | Admitting: Internal Medicine

## 2015-06-07 DIAGNOSIS — E669 Obesity, unspecified: Secondary | ICD-10-CM | POA: Insufficient documentation

## 2015-06-07 NOTE — Assessment & Plan Note (Signed)
Body mass index is 31.63 kg/(m^2).  Lab Results  Component Value Date   TSH 4.180 11/04/2014     Contributing to gerd tendency/ doe/reviewed need  achieve and maintain neg calorie balance > defer f/u primary care including intermittently monitoring thyroid status

## 2015-06-07 NOTE — Assessment & Plan Note (Signed)
sniff 03/15/15 :  demonstrates at the patient does have paralysis of the right hemidiaphragm. There is linear atelectasis at the right lung base.  Offered 2nd opinion at [facility] but pt declined for now

## 2015-06-07 NOTE — Assessment & Plan Note (Signed)
May need to be re-eval and considered for bipap in view of new dx of RHD paralysis, f/u sleep doc prn

## 2015-06-07 NOTE — Assessment & Plan Note (Addendum)
-   assoc with new immediate orthopnea since Jan 2016 and new R HD elevation since cxr was nl 10/19/14 - PFTs 02/16/15  FEV1  0.97 (41%) ratio 78 and 13% better p saba , erv 13% and DLCO 32 corrects to 85> no better on anoro so d/c'd 03/28/15  - Echo 03/09/15 Left ventricle: The cavity size was normal. Wall thickness was normal. Systolic function was severely reduced. The estimated ejection fraction was in the range of 20% to 25%. Doppler parameters are consistent with a reversible restrictive pattern, indicative of decreased left ventricular diastolic compliance and/or increased left atrial pressure (grade 3 diastolic dysfunction). - Aortic valve: There was trivial regurgitation. - Mitral valve: There was mild regurgitation. - Left atrium: The atrium was moderately dilated. - Right ventricle: The cavity size was mildly dilated. Systolic function was mildly reduced. - Right atrium: The atrium was mildly dilated. - 03/11/2015  Walked RA x 3 laps @ 185 ft each stopped due to end of study, nl pace, no desat - 03/11/15 trial of anoro > dc'd 03/28/15 as not effective  - CTa 03/14/2015 > Negative for pulmonary embolism. Elevated right hemidiaphragm with right lower lobe atelectasis. Lungs otherwise clear - R Sniff test 03/15/15  > pos paralysis  - CPST 05/18/15 Exercise testing with gas-exchange demonstrates a low-normal functional capacity when compared to matched sedentary norms. At peak exercise the patient is primarily limited by her severe intrinsic lung disease. Her obesity also appears to be limiting her functional capacity. There is no significant HF limitation observed despite her severe cardiomyopathy.   I had an extended  summary discussion with the patient reviewing all relevant studies completed to date and  lasting 15 to 20 minutes of a 25 minute visit on the following issues:    1) the RHD dysfunction is definitely contributing to her problem but for now she is not interested  in referral > she might be a candidate for plication if there is sign RHD paradox though not specifically noted in report  2) she did not respond to anoro and has no sign obst so ok to remain  off  3) her wt though not really excessive represents the only component that she could improve with neg cal bal (see sep a/p)   Each maintenance medication was reviewed in detail including most importantly the difference between maintenance and as needed and under what circumstances the prns are to be used.  Please see instructions for details which were reviewed in writing and the patient given a copy.

## 2015-06-15 ENCOUNTER — Encounter (HOSPITAL_COMMUNITY): Payer: Self-pay

## 2015-06-15 NOTE — Progress Notes (Signed)
[facility] faxed expedited record request, stating a 05/31/15 request was never returned.  No records on file of the 05/31/15 request, so unsure of what records were needed as the expedited request did not specify.  Latest notes, labs, and diagnostic reports faxed [phone].  Renee Pain

## 2015-06-25 ENCOUNTER — Other Ambulatory Visit (HOSPITAL_COMMUNITY): Payer: Self-pay | Admitting: Internal Medicine

## 2015-07-04 ENCOUNTER — Encounter (HOSPITAL_COMMUNITY): Payer: Self-pay | Admitting: Cardiology

## 2015-07-04 ENCOUNTER — Ambulatory Visit (HOSPITAL_COMMUNITY)
Admission: RE | Admit: 2015-07-04 | Discharge: 2015-07-04 | Disposition: A | Discharge status: Home / Self Care | Payer: 59 | Source: Ambulatory Visit | Attending: Cardiology | Admitting: Cardiology

## 2015-07-04 VITALS — BP 120/64 | HR 61 | Wt 174.0 lb

## 2015-07-04 DIAGNOSIS — I428 Other cardiomyopathies: Secondary | ICD-10-CM | POA: Diagnosis present

## 2015-07-04 DIAGNOSIS — I10 Essential (primary) hypertension: Secondary | ICD-10-CM | POA: Insufficient documentation

## 2015-07-04 DIAGNOSIS — J449 Chronic obstructive pulmonary disease, unspecified: Secondary | ICD-10-CM | POA: Diagnosis not present

## 2015-07-04 DIAGNOSIS — J986 Disorders of diaphragm: Secondary | ICD-10-CM

## 2015-07-04 DIAGNOSIS — I5022 Chronic systolic (congestive) heart failure: Secondary | ICD-10-CM | POA: Diagnosis not present

## 2015-07-04 DIAGNOSIS — Z7982 Long term (current) use of aspirin: Secondary | ICD-10-CM | POA: Diagnosis not present

## 2015-07-04 DIAGNOSIS — Z79899 Other long term (current) drug therapy: Secondary | ICD-10-CM | POA: Insufficient documentation

## 2015-07-04 DIAGNOSIS — I519 Heart disease, unspecified: Secondary | ICD-10-CM

## 2015-07-04 DIAGNOSIS — Z95810 Presence of automatic (implantable) cardiac defibrillator: Secondary | ICD-10-CM | POA: Diagnosis not present

## 2015-07-04 DIAGNOSIS — E039 Hypothyroidism, unspecified: Secondary | ICD-10-CM | POA: Diagnosis not present

## 2015-07-04 DIAGNOSIS — Z87891 Personal history of nicotine dependence: Secondary | ICD-10-CM | POA: Diagnosis not present

## 2015-07-04 DIAGNOSIS — G4733 Obstructive sleep apnea (adult) (pediatric): Secondary | ICD-10-CM | POA: Diagnosis not present

## 2015-07-04 DIAGNOSIS — E785 Hyperlipidemia, unspecified: Secondary | ICD-10-CM | POA: Diagnosis not present

## 2015-07-04 NOTE — Progress Notes (Signed)
Advanced Heart Failure Medication Review by a Pharmacist  Does the patient  feel that his/her medications are working for him/her?  yes  Has the patient been experiencing any side effects to the medications prescribed?  no  Does the patient measure his/her own blood pressure or blood glucose at home?  no   Does the patient have any problems obtaining medications due to transportation or finances?   no  Understanding of regimen: good Understanding of indications: good Potential of compliance: good    Pharmacist comments:  Stacy Hernandez is a pleasant 59 yo F presenting with all of her medication bottles. She has a great understanding of her regimen and the importance of consistent use. She reports great compliance with all of her medications and states that she has a $0 copay for all of them. The only discrepancy noted in her medication list was her potassium which she is no longer taking.   Ruta Hinds. Velva Harman, PharmD, BCPS, CPP Clinical Pharmacist Pager: [phone] Phone: [phone] 07/04/2015 11:21 AM

## 2015-07-04 NOTE — Patient Instructions (Signed)
Your physician recommends that you schedule a follow-up appointment in: 3 months  Do the following things EVERYDAY: 1) Weigh yourself in the morning before breakfast. Write it down and keep it in a log. 2) Take your medicines as prescribed 3) Eat low salt foods-Limit salt (sodium) to 2000 mg per day.  4) Stay as active as you can everyday 5) Limit all fluids for the day to less than 2 liters 6)   

## 2015-07-04 NOTE — Progress Notes (Signed)
Patient ID: Stacy Hernandez, female   DOB: [birth year], 59 y.o.   MRN: [id number]  PCP: Dr. Shelia Media EP: Dr Lovena Le Primary Heart Failure: Dr Aundra Dubin   59 yo with history of HTN, intermittent LBBB, and nonischemic cardiomyopathy presents for cardiology followup.  She was admitted with chest pain in 6/13.  ECG showed intermittent LBBB.  Echo showed EF 45-50%.  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    Developed CP and dyspnea. She was set up for echo and Cardiolite in 12/15.  Echo showed EF 10-15% (significant fall).  She had a Lexiscan Cardiolite with EF 25% and apical anteroseptal and apical fixed perfusion defect.  Therefore, she was sent for left and right heart catheterization in 1/16.  This showed mild elevation in LV and RV filling pressures and no significant CAD.  She was started on Lasix 20 mg daily.  Echo in 5/16 showed EF 20-25%, mildly dilated and mildly dysfunctional RV.  PFTs 4/16 showed severe obstruction and restriction.  She has seen Dr Melvyn Novas.  She is noted to have an elevated right hemidiaphragm.   She returns for HF follow up. Remains SOB with exertion. Disability has been approved.  Denies chest pain. Denies PND/Orthopnea. No ICD shocks. Weight at home 173-174 pounds. Taking all medications.   Labs (12/13): LDL 80, HDL 46, K 4, creatinine 0.7 Labs (6/14): K 4, creatinine 0.8, LDL 73, HDL 45 Labs (12/15): K 4.2, creatinine 0.7, HCT 43.3, LDL 60, HDL 51, BNP 577, TSH normal Labs (2/16): K 4.2, creatinine 0.7 Labs (1/16): SPEP no M spike K 4.3 Creatinine 0.74  Labs (2/16): K 4.2, creatinine 0.78 Labs (02/23/2015) : K 4.2 Creatinine 0.90, BNP 339 Labs (04/13/2015): K 4.5 Creatinine 1.01  Labs 05/17/2015: K 4.7 Creatinine 0.71   PMH: 1. HTN 2. Hyperlipidemia 3. COPD: Prior smoker.  PFTs (4/16) with FVC 37%, FEV1 36%, ratio 97%, TLC 67%, DLCO 32% => severe obstruction and restriction.  COPD + elevated right hemidiaphragm.  4. Intermittent LBBB/IVCD 5. Hypothyroidism 6. Cardiomyopathy:  Nonischemic.  Echo (6/13) with EF 45-50%, paradoxical septal motion.  LHC (6/13) with 40% D1 stenosis, 30% PLV stenosis.  Echo (12/15) with EF 10-15%, moderately dilated LV, diffuse hypokinesis, moderate MR.   Lexiscan Cardiolite (12/15) with EF 25%, diffuse hypokinesis, apical anteroseptal and apical fixed defect. LHC/RHC (1/16) with mean RA 8, PA 37/19, mean PCWP 19, CI 2.63, EF 30%, no significant CAD.  Cardiac MRI 12/20/2014: EF 24% Mod mitral regurg thought to be from dilated LV, normal RV size, no LGE.  Echo (5/16) with EF 20-25%, mildly dilated and mildly dysfunctional RV, mild MR.    7. 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  8. 04/15/2015 Medtronicn CRT-d  9. 04/18/2015 Sleep Study - CPAP  10. CPX: Peak VO2 16% RER 1.1 VE MVV 93% Limited by lung disease. No HF limitation.   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  Quit smoking in 1/16.    FH: Father CABG at 24   ROS: All systems reviewed and negative except as per HPI.   Current Outpatient Prescriptions  Medication Sig Dispense Refill  . aspirin 81 MG tablet Take 81 mg by mouth daily.    Marland Kitchen atorvastatin (LIPITOR) 10 MG tablet TAKE 1 TABLET BY MOUTH AT BEDTIME 30 tablet 6  . bisoprolol (ZEBETA) 5 MG tablet TAKE 1 TABLET (5 MG TOTAL) BY MOUTH DAILY. 30 tablet 3  . Cholecalciferol (VITAMIN D-3 PO) Take 1,000 Units  by mouth daily.     Marland Kitchen ENTRESTO 49-51 MG TAKE 1 TABLET BY MOUTH 2 (TWO) TIMES DAILY. 60 tablet 3  . furosemide (LASIX) 20 MG tablet TAKE 2 TABLETS (40 MG TOTAL) BY MOUTH DAILY. 60 tablet 3  . HYDROcodone-acetaminophen (NORCO) 5-325 MG per tablet Take 1 tablet by mouth at bedtime.     . hydroxychloroquine (PLAQUENIL) 200 MG tablet Take 200 mg by mouth 2 (two) times daily.  5  . Inositol Niacinate (NIACIN FLUSH FREE) 500 MG CAPS Take 3 capsules by mouth daily.    Marland Kitchen levothyroxine (SYNTHROID, LEVOTHROID) 100 MCG tablet Take 100 mcg by mouth daily before breakfast.    .  Multiple Vitamins-Minerals (MULTIVITAMIN WITH MINERALS) tablet Take 1 tablet by mouth daily.    Marland Kitchen spironolactone (ALDACTONE) 25 MG tablet Take 0.5 tablets (12.5 mg total) by mouth daily. 15 tablet 3  . nitroGLYCERIN (NITROSTAT) 0.4 MG SL tablet Place 1 tablet (0.4 mg total) under the tongue every 5 (five) minutes as needed for chest pain. (Patient not taking: Reported on 07/04/2015) 25 tablet 3   No current facility-administered medications for this encounter.    BP 120/64 mmHg  Pulse 61  Wt 174 lb (78.926 kg)  SpO2 96% General: NAD Husband present Neck: JVP not elevated, no thyromegaly or thyroid nodule.  Lungs: Decreased breath sounds  CV: Nondisplaced PMI.  Heart regular S1/S2, no S3/S4, 1/6 SEM.  No peripheral edema.  No carotid bruit.  Normal pedal pulses. L upper chest scar intact no erythema or exudate.  Abdomen: Soft, nontender, no hepatosplenomegaly, mild distention.  Neurologic: Alert and oriented x 3.  Psych: Normal affect. Extremities: No clubbing or cyanosis.   Assessment/Plan: 1. Cardiomyopathy: Nonischemic cardiomyopathy. In the past, she had EF 45-50% with no significant CAD (2013).  Per Dr Aundra Dubin -- LBBB-related nonischemic cardiomyopathy.  However, in the fall of 2015 she developed exertional chest pain and dyspnea.  Echo and Cardiolite showed marked worsening of her LV systolic function (EF 16-38% by echo) and Cardiolite suggested possible prior MI.  Left heart cath in 1/16 showed no significant CAD.  RHC showed mildly elevated right and left heart filling pressures and normal CI. SPEP negative and no infiltrative disease on cardiac MRI.   Repeat echo in 5/16 with EF 20-25%.   CPX showed limitations are related to lung disease as well as obesity. No HF limitation.  NYHA class II-III symptoms.   - Volume status stable. Continue lasix Lasix  40 mg daily.     - Continue bisoprolol 5 mg daily. Will not titrate. Today heart rate 61 bpm.  - Continue Entresto 49/51 mg bid.  -  Continue spironolactone 12.5 mg daily  Does not need to return to work until she returns to the office for further evaluation. No lite duty available at her work place and she can only return full time when she can perform all duties at 100%. I am still not convinced she will be able to return.   Disability pending.  2. COPD:  PFTs showed severe COPD but also restriction from elevated right hemidiaphragm.  Followed by pulmonary.   3. OSA: Plan to start CPAP 07/17/15. 4. S/P Medtronic CRT-D 04/15/2015    Follow up in 2-3 months. Provided a letter she needs to remain out of work until next office visit.  Briane Birden NP-C  07/04/2015

## 2015-07-12 ENCOUNTER — Encounter (HOSPITAL_COMMUNITY): Payer: Self-pay

## 2015-07-12 NOTE — Progress Notes (Signed)
Medical record request received to CHF clinic from [facility]. All records available by our office/providers 04/15/15 - present faxed to provided # [phone] as requested. Claim # [id number] Copy of medical record request scanned into electronic medical records.  Renee Pain

## 2015-07-17 ENCOUNTER — Ambulatory Visit (HOSPITAL_BASED_OUTPATIENT_CLINIC_OR_DEPARTMENT_OTHER): Payer: 59 | Attending: Cardiology

## 2015-07-17 VITALS — Ht 62.0 in | Wt 167.0 lb

## 2015-07-17 DIAGNOSIS — R0683 Snoring: Secondary | ICD-10-CM | POA: Diagnosis not present

## 2015-07-17 DIAGNOSIS — I493 Ventricular premature depolarization: Secondary | ICD-10-CM | POA: Insufficient documentation

## 2015-07-17 DIAGNOSIS — G4733 Obstructive sleep apnea (adult) (pediatric): Secondary | ICD-10-CM | POA: Insufficient documentation

## 2015-07-18 ENCOUNTER — Telehealth: Payer: Self-pay | Admitting: Cardiology

## 2015-07-18 NOTE — Progress Notes (Signed)
    Patient Name: Stacy Hernandez, Arps MRN: [id number] Study Date: 07/17/2015 Gender: Female D.O.B: Jun 18, 1956 Age (years): 76 Referring Provider: Fransico Him MD, ABSM Interpreting Physician: Fransico Him MD, ABSM RPSGT: Joni Reining  Height (inches): 62 Weight (lbs): 167 BMI: 31 Neck Size: 14.00   CLINICAL INFORMATION Sleep Study Type: CPAP Titration  Indication for sleep study: OSA  Epworth Sleepiness Score: 11  Date of PSG:  04/18/2015  SLEEP STUDY TECHNIQUE As per the AASM Manual for the Scoring of Sleep and Associated Events v2.3 (April 2016) with a hypopnea requiring 4% desaturations.  The channels recorded and monitored were frontal, central and occipital EEG, electrooculogram (EOG), submentalis EMG (chin), nasal and oral airflow, thoracic and abdominal wall motion, anterior tibialis EMG, snore microphone, electrocardiogram, and pulse oximetry. Continuous positive airway pressure (CPAP) was initiated when the patient met split night criteria and was titrated according to treat sleep-disordered breathing.  MEDICATIONS Medications taken by the patient : ASA, Lipitor, Zebeta, Entresto, Lasix, Plaquenil, Niacin, Synthroid, Aldactone.   Medications administered by patient during sleep study : No sleep medicine administered.  RESPIRATORY PARAMETERS  Titration  Optimal Pressure (cm):N/A AHI at Optimal Pressure (/hr):0  Min O2 at Optimal Pressure (%):N/A Supine % at Optimal (%):N/A  Sleep % at Optimal (%):N/A      SLEEP ARCHITECTURE The recording time for the entire night was 364.7 minutes  During the titration period of 351.6 minutes, the patient slept for 243 minutes in REM and nonREM, yielding a sleep efficiency of 67%. Sleep onset after CPAP initiation was 13 minutes with a prolonged REM latency of 237 minutes. The patient spent 5% of the night in stage N1 sleep, 2.7% in stage N2 sleep, 24.5% in stage N3 and 69.3% in REM.  CARDIAC DATA The 2 lead EKG demonstrated  sinus rhythm. The mean heart rate was 63.45 beats per minute. Other EKG findings include: PVCs.  LEG MOVEMENT DATA The total Periodic Limb Movements of Sleep (PLMS) were 303. The PLMS index was 74.81 .  IMPRESSIONS An optimal PAP pressure was not selected for this patient as the patient was not able to achieve REM supine sleep.    The patient snored with Soft snoring volume during the diagnostic portion of the study. EKG findings include PVCs.  DIAGNOSIS Obstructive Sleep Apnea (327.23 [G47.33 ICD-10])  RECOMMENDATIONS Recommend an outpt CPAP titration from 4 to 20cm H2O for 2 weeks to try to determine appropriate pressure.  Avoid alcohol, sedatives and other CNS depressants that may worsen sleep apnea and disrupt normal sleep architecture. Sleep hygiene should be reviewed to assess factors that may improve sleep quality. Weight management and regular exercise should be initiated or continued. Return to [facility] for re-evaluation after 10 weeks of therapy  [place], American Board of Sleep Medicine  ELECTRONICALLY SIGNED ON:  07/18/2015, 9:43 PM [place] PH: [phone]   FX: [phone] [place]

## 2015-07-18 NOTE — Telephone Encounter (Signed)
Patient did not have successful CPAP titration due to lack of REM supine sleep.  Order placed in EPIC for auto CPAP for 2 weeks with CPAP from 4 to 20cm H2O.  Set up OV with me in 10 weeks

## 2015-07-18 NOTE — Addendum Note (Signed)
Addended by: Sueanne Margarita on: 07/18/2015 09:51 PM   Modules accepted: Orders

## 2015-07-19 NOTE — Telephone Encounter (Signed)
Patient is aware.   [facility] Notified.  Once she has her machine we will schedule 10 week OV

## 2015-07-22 ENCOUNTER — Encounter: Payer: 59 | Admitting: Internal Medicine

## 2015-07-22 DIAGNOSIS — Z0271 Encounter for disability determination: Secondary | ICD-10-CM

## 2015-08-02 ENCOUNTER — Encounter: Payer: Self-pay | Admitting: Internal Medicine

## 2015-08-11 ENCOUNTER — Ambulatory Visit (INDEPENDENT_AMBULATORY_CARE_PROVIDER_SITE_OTHER): Payer: 59 | Admitting: Internal Medicine

## 2015-08-11 ENCOUNTER — Encounter: Payer: Self-pay | Admitting: Internal Medicine

## 2015-08-11 VITALS — BP 128/82 | HR 64 | Ht 62.0 in | Wt 174.4 lb

## 2015-08-11 DIAGNOSIS — I429 Cardiomyopathy, unspecified: Secondary | ICD-10-CM

## 2015-08-11 DIAGNOSIS — Z95810 Presence of automatic (implantable) cardiac defibrillator: Secondary | ICD-10-CM

## 2015-08-11 DIAGNOSIS — I5022 Chronic systolic (congestive) heart failure: Secondary | ICD-10-CM

## 2015-08-11 DIAGNOSIS — I10 Essential (primary) hypertension: Secondary | ICD-10-CM | POA: Diagnosis not present

## 2015-08-11 LAB — CUP PACEART INCLINIC DEVICE CHECK
Battery Remaining Longevity: 100 mo
Battery Voltage: 3.05 V
Brady Statistic AP VP Percent: 28.17 %
Brady Statistic AP VS Percent: 0.16 %
Brady Statistic AS VP Percent: 70.68 %
Brady Statistic AS VS Percent: 0.99 %
Brady Statistic RA Percent Paced: 28.33 %
Brady Statistic RV Percent Paced: 98.56 %
Date Time Interrogation Session: 20161006142743
HighPow Impedance: 171 Ohm
HighPow Impedance: 66 Ohm
Lead Channel Impedance Value: 361 Ohm
Lead Channel Impedance Value: 418 Ohm
Lead Channel Impedance Value: 456 Ohm
Lead Channel Impedance Value: 608 Ohm
Lead Channel Impedance Value: 665 Ohm
Lead Channel Pacing Threshold Amplitude: 0.5 V
Lead Channel Pacing Threshold Amplitude: 0.625 V
Lead Channel Pacing Threshold Amplitude: 1 V
Lead Channel Pacing Threshold Pulse Width: 0.4 ms
Lead Channel Pacing Threshold Pulse Width: 0.4 ms
Lead Channel Pacing Threshold Pulse Width: 0.4 ms
Lead Channel Sensing Intrinsic Amplitude: 16.875 mV
Lead Channel Sensing Intrinsic Amplitude: 21.875 mV
Lead Channel Sensing Intrinsic Amplitude: 5.5 mV
Lead Channel Sensing Intrinsic Amplitude: 6.125 mV
Lead Channel Setting Pacing Amplitude: 1.5 V
Lead Channel Setting Pacing Amplitude: 2 V
Lead Channel Setting Pacing Amplitude: 2.25 V
Lead Channel Setting Pacing Pulse Width: 0.4 ms
Lead Channel Setting Pacing Pulse Width: 0.4 ms
Lead Channel Setting Sensing Sensitivity: 0.3 mV
Zone Setting Detection Interval: 300 ms
Zone Setting Detection Interval: 350 ms
Zone Setting Detection Interval: 360 ms
Zone Setting Detection Interval: 450 ms

## 2015-08-11 NOTE — Progress Notes (Signed)
HPI Stacy Hernandez returns today for ongoing evaluation and management of her ICD. She is a 59 yo woman with multiple medical problems including chronic systolic heart failure, HTN, COPD with a h/o tobacco abuse, and obesity. She underwent CPX testing several months ago, after her BiV ICD and this demonstrated a pulmonary functional limitation. She thinks she has had a modest improvement in her dyspnea since her biV but she is still limited by her acitivity.  Allergies  Allergen Reactions  . Prednisone Nausea Only     Current Outpatient Prescriptions  Medication Sig Dispense Refill  . aspirin 81 MG tablet Take 81 mg by mouth daily.    Marland Kitchen atorvastatin (LIPITOR) 10 MG tablet TAKE 1 TABLET BY MOUTH AT BEDTIME 30 tablet 6  . bisoprolol (ZEBETA) 5 MG tablet TAKE 1 TABLET (5 MG TOTAL) BY MOUTH DAILY. 30 tablet 3  . Cholecalciferol (VITAMIN D-3 PO) Take 1,000 Units by mouth daily.     Marland Kitchen ENTRESTO 49-51 MG TAKE 1 TABLET BY MOUTH 2 (TWO) TIMES DAILY. 60 tablet 3  . furosemide (LASIX) 20 MG tablet TAKE 2 TABLETS (40 MG TOTAL) BY MOUTH DAILY. 60 tablet 3  . HYDROcodone-acetaminophen (NORCO) 5-325 MG per tablet Take 1 tablet by mouth at bedtime.     . hydroxychloroquine (PLAQUENIL) 200 MG tablet Take 200 mg by mouth 2 (two) times daily.  5  . Inositol Niacinate (NIACIN FLUSH FREE) 500 MG CAPS Take 3 capsules by mouth daily.    Marland Kitchen levothyroxine (SYNTHROID, LEVOTHROID) 100 MCG tablet Take 100 mcg by mouth daily before breakfast.    . Multiple Vitamins-Minerals (MULTIVITAMIN WITH MINERALS) tablet Take 1 tablet by mouth daily.    . nitroGLYCERIN (NITROSTAT) 0.4 MG SL tablet Place 1 tablet (0.4 mg total) under the tongue every 5 (five) minutes as needed for chest pain. 25 tablet 3  . spironolactone (ALDACTONE) 25 MG tablet Take 0.5 tablets (12.5 mg total) by mouth daily. 15 tablet 3   No current facility-administered medications for this visit.     Past Medical History  Diagnosis Date  . Thyroid  disease     Post-ablation hypothyroidism  . Hypertriglyceridemia   . Hypercalcemia     Due to HCTZ, improved with d/c of calcium supplements  . Nephrolithiasis   . Osteoporosis   . Tobacco abuse   . Back pain     Recent positive ANA but told she has OA  . Cardiomyopathy ([place])     a.  EF 45-50% by echo 04/23/2012;  b.  Echo (12/15):  EF 10-15%, Gr 2 DD, ventricular septum dyssynergy, mod MR, mod LAE.  Marland Kitchen CAD (coronary artery disease)     a.  per cath in June 2013 with nonobstructive CAD;  b.  Lexiscan Myoview (12/15):  No ischemia.  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 Hypertension   . AICD (automatic cardioverter/defibrillator) present 04/15/2015    Medtronic (serial Number [id number]) biventricular ICD  . COPD (chronic obstructive pulmonary disease) ([place])   . OA (osteoarthritis)   . Sleep apnea     ROS:   All systems reviewed and negative except as noted in the HPI.   Past Surgical History  Procedure Laterality Date  . Bladder surgery      bladder & bowel tac with mesh  . Partial hysterectomy      Due to cervical dysplasia  . Cesarean section    .  Tonsillectomy    . Carpal tunnel release    . Left heart catheterization with coronary angiogram N/A 04/22/2012    Procedure: LEFT HEART CATHETERIZATION WITH CORONARY ANGIOGRAM;  Surgeon: Hillary Bow, MD;  Location: [facility] CATH LAB;  Service: Cardiovascular;  Laterality: N/A;  . Left and right heart catheterization with coronary angiogram N/A 11/12/2014    Procedure: LEFT AND RIGHT HEART CATHETERIZATION WITH CORONARY ANGIOGRAM;  Surgeon: Larey Dresser, MD;  Location: [facility] CATH LAB;  Service: Cardiovascular;  Laterality: N/A;  . Ep implantable device N/A 04/15/2015    Procedure: BiV ICD Insertion CRT-D;  Surgeon: Evans Lance, MD;  Medtronic (serial Number [id number]) biventricular ICD     Family History  Problem Relation Age of Onset  . Coronary artery disease  Father     s/p CABG in his 26s  . Hypertension Father   . COPD Mother     smoked  . Colon polyps Mother   . Diabetes Sister   . Cirrhosis Sister   . Colon cancer Maternal Uncle 78  . Heart attack Father   . Stroke Neg Hx      Social History   Social History  . Marital Status: Married    Spouse Name: N/A  . Number of Children: N/A  . Years of Education: N/A   Occupational History  . Not on file.   Social History Main Topics  . Smoking status: Former Smoker -- 2.00 packs/day for 35 years    Types: Cigarettes    Quit date: 11/05/2014  . Smokeless tobacco: Never Used  . Alcohol Use: No  . Drug Use: No  . Sexual Activity: Not Currently   Other Topics Concern  . Not on file   Social History Narrative     BP 128/82 mmHg  Pulse 64  Ht 5\' 2"  (1.575 m)  Wt 174 lb 6.4 oz (79.107 kg)  BMI 31.89 kg/m2  Physical Exam:  Well appearing 59 yo woman, NAD HEENT: Unremarkable Neck:  No JVD, no thyromegally Lymphatics:  No adenopathy Back:  No CVA tenderness Lungs:  Clear but decreased breath sounds. HEART:  Regular rate rhythm, no murmurs, no rubs, no clicks Abd:  soft, positive bowel sounds, no organomegally, no rebound, no guarding Ext:  2 plus pulses, no edema, no cyanosis, no clubbing Skin:  No rashes no nodules Neuro:  CN II through XII intact, motor grossly intact  Device followup - normal device function.  Assess/Plan:

## 2015-08-11 NOTE — Assessment & Plan Note (Signed)
Her Medtronic BiV device is working normally. Will recheck in several months.

## 2015-08-11 NOTE — Assessment & Plan Note (Signed)
Her symptoms are stable but she has continued dyspnea, likely due to a pulmonary limitation based on her CPX.

## 2015-08-11 NOTE — Patient Instructions (Signed)
Medication Instructions:  Your physician recommends that you continue on your current medications as directed. Please refer to the Current Medication list given to you today.   Labwork: None ordered   Testing/Procedures: None ordered   Follow-Up: Your physician wants you to follow-up in: 9 months with Dr Knox Saliva will receive a reminder letter in the mail two months in advance. If you don't receive a letter, please call our office to schedule the follow-up appointment.  Remote monitoring is used to monitor your  ICD from home. This monitoring reduces the number of office visits required to check your device to one time per year. It allows [place] to keep an eye on the functioning of your device to ensure it is working properly. You are scheduled for a device check from home on 11/10/15. You may send your transmission at any time that day. If you have a wireless device, the transmission will be sent automatically. After your physician reviews your transmission, you will receive a postcard with your next transmission date.     Any Other Special Instructions Will Be Listed Below (If Applicable).

## 2015-08-11 NOTE — Assessment & Plan Note (Signed)
Her blood pressure is well controlled. No change in meds. 

## 2015-08-19 ENCOUNTER — Other Ambulatory Visit: Payer: Self-pay

## 2015-08-19 DIAGNOSIS — Z1231 Encounter for screening mammogram for malignant neoplasm of breast: Secondary | ICD-10-CM

## 2015-08-21 ENCOUNTER — Other Ambulatory Visit (HOSPITAL_COMMUNITY): Payer: Self-pay | Admitting: Cardiology

## 2015-09-06 ENCOUNTER — Encounter: Payer: Self-pay | Admitting: Internal Medicine

## 2015-09-06 ENCOUNTER — Ambulatory Visit (INDEPENDENT_AMBULATORY_CARE_PROVIDER_SITE_OTHER): Payer: 59 | Admitting: Internal Medicine

## 2015-09-06 VITALS — BP 124/80 | HR 67 | Ht 62.0 in | Wt 174.4 lb

## 2015-09-06 DIAGNOSIS — R0600 Dyspnea, unspecified: Secondary | ICD-10-CM | POA: Diagnosis not present

## 2015-09-06 DIAGNOSIS — E669 Obesity, unspecified: Secondary | ICD-10-CM

## 2015-09-06 DIAGNOSIS — G4733 Obstructive sleep apnea (adult) (pediatric): Secondary | ICD-10-CM | POA: Diagnosis not present

## 2015-09-06 NOTE — Progress Notes (Signed)
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 Pulmonary Consult    Referred by Dr. Loralie Champagne. Pt c/o SOB for the past yr. She states that she gets SOB with "just about anything"- house work, walking short distances such as from room to room at home. She has occ non prod cough.  indolent onset progressive doe x 1 y worse esp since jan 2016 assoc with cough better since quit smoking and has developed sense of immediate orthopnea so sleeps on side. Min am cough productive of mucoid sputum only.    rec anoro one click each am Late add  CTa chest and Sniff test eval sob > paralyzed R HD    03/28/2015 f/u ov/Hayle Parisi re: paralyzed R HD /on anoro no better  Chief Complaint  Patient presents with  . Follow-up    Pt states SOB and cough are unchanged since her last visit. No new co's today.    main concern is immediate orthopnea and doe x anytime she gets in a hurry or inclines/ steps rec Weight control is simply a Hernandez of calorie balance If you need a sleep specialist you should see one of our doctors due to the added problem of the diaphragm dysfunction which would be expected to be worse when sleeping. For now do not recommend  any follow up but in future (say 3-4 months)  may need to be referred to [facility] if not improving in terms of ex tolerance  With treatment of your heart problems per cardiology       06/02/2015 f/u ov/Zair Borawski re: obesity with R HD paralysis  Chief Complaint  Patient presents with  . Follow-up    Pt advised to return here per the request of Dr Aundra Dubin. She  had CPST 05/18/15. She states that her breathing is unchanged since the last visit here and she denies any new co's today.    rec Weight control is simply a Hernandez of calorie balance   Please schedule a follow up visit in 3 months but call sooner if needed to see one of our sleep docs to review your cpap and consider for bipap    09/06/2015  f/u ov/Vanshika Jastrzebski re: obesity with paralyzed R HD  Chief Complaint  Patient presents with  . Follow-up    Pt states that her breathing is "maybe a little better". No new co's today.   no luck with wt loss, has not had sleep doc eval yet   No obvious day to day or daytime variabilty or assoc chronic cough or cp or chest tightness, subjective wheeze overt sinus or hb symptoms. No unusual exp hx or h/o childhood pna/ asthma or knowledge of premature birth.  Sleeping ok without nocturnal  or early am exacerbation  of respiratory  c/o's or need for noct saba. Also denies any obvious fluctuation of symptoms with weather or environmental changes or other aggravating or alleviating factors except as outlined above   Current Medications, Allergies, Complete Past Medical History, Past Surgical History, Family History, and Social History  were reviewed in [facility] record.  ROS  The following are not active complaints unless bolded sore throat, dysphagia, dental problems, itching, sneezing,  nasal congestion or excess/ purulent secretions, ear ache,   fever, chills, sweats, unintended wt loss, pleuritic or exertional cp, hemoptysis,  orthopnea pnd or leg swelling, presyncope, palpitations, heartburn, abdominal pain, anorexia, nausea, vomiting, diarrhea  or change in bowel or urinary habits, change in stools or urine, dysuria,hematuria,  rash, arthralgias, visual complaints, headache, numbness weakness or ataxia or problems with walking or coordination,  change in mood/affect or memory.                       Objective:   Physical Exam   amb  wf nad  03/28/2015        167 > 06/02/2015   173 > 09/06/2015 174  Wt Readings from Last 3 Encounters:  03/11/15 168 lb (76.204 kg)  02/09/15 167 lb 12.8 oz (76.114 kg)  12/20/14 169 lb (76.658 kg)    Vital signs reviewed   HEENT: nl dentition, turbinates, and orophanx. Nl external ear canals without cough reflex   NECK :  without JVD/Nodes/TM/ nl carotid upstrokes bilaterally   LUNGS: no acc muscle use, slt distant bs esp R base    CV:  RRR  no s3 or murmur or increase in P2, no edema   ABD:  soft and nontender with nl excursion in the supine position. No bruits or organomegaly, bowel sounds nl  MS:  warm without deformities, calf tenderness, cyanosis or clubbing  SKIN: warm and dry without lesions    NEURO:  alert, approp, no deficits      I personally reviewed images and agree with radiology impression as follows:  CTa chest: 03/14/15 Negative for pulmonary embolism. Elevated right hemidiaphragm with right lower lobe atelectasis. Lungs otherwise clear.      Assessment & Plan:    Outpatient Encounter Prescriptions as of 09/06/2015  Medication Sig  . aspirin 81 MG tablet Take 81 mg by mouth daily.  Marland Kitchen atorvastatin (LIPITOR) 10 MG tablet TAKE 1 TABLET BY MOUTH AT BEDTIME  . bisoprolol (ZEBETA) 5 MG tablet TAKE 1 TABLET (5 MG TOTAL) BY MOUTH DAILY.  Marland Kitchen Cholecalciferol (VITAMIN D-3 PO) Take 1,000 Units by mouth daily.   Marland Kitchen ENTRESTO 49-51 MG TAKE 1 TABLET BY MOUTH 2 (TWO) TIMES DAILY.  . furosemide (LASIX) 20 MG tablet TAKE 2 TABLETS (40 MG TOTAL) BY MOUTH DAILY.  Marland Kitchen HYDROcodone-acetaminophen (NORCO) 5-325 MG per tablet Take 1 tablet by mouth at bedtime.   . hydroxychloroquine (PLAQUENIL) 200 MG tablet Take 200 mg by mouth 2 (two) times daily.  . Inositol Niacinate (NIACIN FLUSH FREE) 500 MG CAPS Take 3 capsules by mouth daily.  Marland Kitchen levothyroxine (SYNTHROID, LEVOTHROID) 100 MCG tablet Take 100 mcg by mouth daily before breakfast.  . Multiple Vitamins-Minerals (MULTIVITAMIN  WITH MINERALS) tablet Take 1 tablet by mouth daily.  . nitroGLYCERIN (NITROSTAT) 0.4 MG SL tablet Place 1 tablet (0.4 mg total) under the tongue every 5 (five) minutes as needed for chest pain.  Marland Kitchen spironolactone (ALDACTONE) 25 MG tablet Take 0.5 tablets (12.5 mg total) by mouth daily.  . [DISCONTINUED] spironolactone (ALDACTONE) 25 MG tablet TAKE 1/2 TABLET BY MOUTH EVERY DAY   No facility-administered encounter medications on file as of 09/06/2015.

## 2015-09-06 NOTE — Patient Instructions (Signed)
Please see patient coordinator before you leave today  to schedule autoset cpap and overnight oximetry on cpap after 2 weeks and I will call with additional recs  Weight control is simply a matter of calorie balance which needs to be tilted in your favor by eating less and exercising more.  To get the most out of exercise, you need to be continuously aware that you are short of breath, but never out of breath, for 30 minutes daily. As you improve, it will actually be easier for you to do the same amount of exercise  in  30 minutes so always push to the level where you are short of breath.  If this does not result in gradual weight reduction then I strongly recommend you see a nutritionist with a food diary x 2 weeks so that we can work out a negative calorie balance which is universally effective in steady weight loss programs.  Think of your calorie balance like you do your bank account where in this case you want the balance to go down so you must take in less calories than you burn up.  It's just that simple:  Hard to do, but easy to understand.  Good luck!   Set up Dr Halford Chessman eval in 3 months or next available   after that to establish for sleep follow up

## 2015-09-09 ENCOUNTER — Ambulatory Visit
Admission: RE | Admit: 2015-09-09 | Discharge: 2015-09-09 | Disposition: A | Discharge status: Home / Self Care | Payer: 59 | Source: Ambulatory Visit

## 2015-09-09 ENCOUNTER — Encounter: Payer: Self-pay | Admitting: Internal Medicine

## 2015-09-09 DIAGNOSIS — Z1231 Encounter for screening mammogram for malignant neoplasm of breast: Secondary | ICD-10-CM

## 2015-09-09 NOTE — Assessment & Plan Note (Signed)
Body mass index is 31.89   Unfortunately trending up  Lab Results  Component Value Date   TSH 4.180 11/04/2014     Contributing to  doe/reviewed the need and the process to achieve and maintain neg calorie balance > defer f/u primary care including intermittently monitoring thyroid status

## 2015-09-09 NOTE — Assessment & Plan Note (Signed)
09/06/2015 rec autoset cpap titration and download > f/u Dr Halford Chessman (reviewed case in entirety with Dr Halford Chessman including perhaps need to change over to bipap since also has paralyzed L HD

## 2015-09-09 NOTE — Assessment & Plan Note (Signed)
-   assoc with new immediate orthopnea since Jan 2016 and new R HD elevation since cxr was nl 10/19/14 - PFTs 02/16/15  FEV1  0.97 (41%) ratio 78 and 13% better p saba , erv 13% and DLCO 32 corrects to 85> no better on anoro so d/c'd 03/28/15  - Echo 03/09/15 Left ventricle: The cavity size was normal. Wall thickness was normal. Systolic function was severely reduced. The estimated ejection fraction was in the range of 20% to 25%. Doppler parameters are consistent with a reversible restrictive pattern, indicative of decreased left ventricular diastolic compliance and/or increased left atrial pressure (grade 3 diastolic dysfunction). - Aortic valve: There was trivial regurgitation. - Mitral valve: There was mild regurgitation. - Left atrium: The atrium was moderately dilated. - Right ventricle: The cavity size was mildly dilated. Systolic function was mildly reduced. - Right atrium: The atrium was mildly dilated. - 03/11/2015  Walked RA x 3 laps @ 185 ft each stopped due to end of study, nl pace, no desat - 03/11/15 trial of anoro > dc'd 03/28/15 as not effective  - CTa 03/14/2015 > Negative for pulmonary embolism. Elevated right hemidiaphragm with right lower lobe atelectasis. Lungs otherwise clear - R Sniff test 03/15/15  > pos paralysis  - CPST 05/18/15 Exercise testing with gas-exchange demonstrates a low-normal functional capacity when compared to matched sedentary norms. At peak exercise the patient is primarily limited by her severe intrinsic lung disease. Her obesity also appears to be limiting her functional capacity. There is no significant HF limitation observed despite her severe cardiomyopathy.   So she has multifactorial sob but mostly related to obesity and would benefit from wt loss and re-eval by sleep specialist  I had an extended discussion with the patient reviewing all relevant studies completed to date and  lasting 15 to 20 minutes of a 25 minute visit    Each  maintenance medication was reviewed in detail including most importantly the difference between maintenance and prns and under what circumstances the prns are to be triggered using an action plan format that is not reflected in the computer generated alphabetically organized AVS.    Please see instructions for details which were reviewed in writing and the patient given a copy highlighting the part that I personally wrote and discussed at today's ov.

## 2015-09-16 ENCOUNTER — Ambulatory Visit (HOSPITAL_COMMUNITY)
Admission: RE | Admit: 2015-09-16 | Discharge: 2015-09-16 | Disposition: A | Discharge status: Home / Self Care | Payer: 59 | Source: Ambulatory Visit | Attending: Cardiology | Admitting: Cardiology

## 2015-09-16 ENCOUNTER — Encounter (HOSPITAL_COMMUNITY): Payer: Self-pay | Admitting: *Deleted

## 2015-09-16 VITALS — BP 138/78 | HR 60 | Wt 172.5 lb

## 2015-09-16 DIAGNOSIS — I5022 Chronic systolic (congestive) heart failure: Secondary | ICD-10-CM | POA: Diagnosis not present

## 2015-09-16 DIAGNOSIS — I10 Essential (primary) hypertension: Secondary | ICD-10-CM | POA: Insufficient documentation

## 2015-09-16 DIAGNOSIS — E785 Hyperlipidemia, unspecified: Secondary | ICD-10-CM | POA: Diagnosis not present

## 2015-09-16 DIAGNOSIS — G4733 Obstructive sleep apnea (adult) (pediatric): Secondary | ICD-10-CM | POA: Insufficient documentation

## 2015-09-16 DIAGNOSIS — I429 Cardiomyopathy, unspecified: Secondary | ICD-10-CM | POA: Diagnosis present

## 2015-09-16 DIAGNOSIS — J986 Disorders of diaphragm: Secondary | ICD-10-CM | POA: Diagnosis not present

## 2015-09-16 DIAGNOSIS — E039 Hypothyroidism, unspecified: Secondary | ICD-10-CM | POA: Insufficient documentation

## 2015-09-16 DIAGNOSIS — J449 Chronic obstructive pulmonary disease, unspecified: Secondary | ICD-10-CM | POA: Insufficient documentation

## 2015-09-16 DIAGNOSIS — Z87891 Personal history of nicotine dependence: Secondary | ICD-10-CM | POA: Insufficient documentation

## 2015-09-16 DIAGNOSIS — I252 Old myocardial infarction: Secondary | ICD-10-CM | POA: Insufficient documentation

## 2015-09-16 LAB — BASIC METABOLIC PANEL
Anion gap: 5 (ref 5–15)
BUN: 10 mg/dL (ref 6–20)
CO2: 28 mmol/L (ref 22–32)
Calcium: 9.4 mg/dL (ref 8.9–10.3)
Chloride: 104 mmol/L (ref 101–111)
Creatinine, Ser: 0.74 mg/dL (ref 0.44–1.00)
GFR calc Af Amer: >60 mL/min (ref >60)
GFR calc non Af Amer: >60 mL/min (ref >60)
Glucose, Bld: 94 mg/dL (ref 65–99)
Potassium: 4.1 mmol/L (ref 3.5–5.1)
Sodium: 137 mmol/L (ref 135–145)

## 2015-09-16 LAB — BRAIN NATRIURETIC PEPTIDE: B Natriuretic Peptide: 120.9 pg/mL — ABNORMAL HIGH (ref 0.0–100.0)

## 2015-09-16 MED ORDER — BISOPROLOL FUMARATE 10 MG PO TABS: 10.0000 mg | ORAL_TABLET | Freq: Every day | ORAL | Status: DC

## 2015-09-16 NOTE — Patient Instructions (Signed)
Increase Bisoprolol to 10 mg daily  Labs today  Your physician has requested that you have an echocardiogram. Echocardiography is a painless test that uses sound waves to create images of your heart. It provides your doctor with information about the size and shape of your heart and how well your heart's chambers and valves are working. This procedure takes approximately one hour. There are no restrictions for this procedure.  Your physician recommends that you schedule a follow-up appointment in: 3 months

## 2015-09-17 NOTE — Progress Notes (Signed)
Patient ID: Stacy Hernandez, female   DOB: 1955/12/13, 59 y.o.   MRN: [id number] PCP: Dr. Shelia Media EP: Dr Lovena Le Cardiology: Dr Aundra Dubin   59 yo with history of HTN, intermittent LBBB, and nonischemic cardiomyopathy presents for cardiology followup.  She was admitted with chest pain in 6/13.  ECG showed intermittent LBBB.  Echo showed EF 45-50%.  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    Developed CP and dyspnea. She was set up for echo and Cardiolite in 12/15.  Echo showed EF 10-15% (significant fall).  She had a Lexiscan Cardiolite with EF 25% and apical anteroseptal and apical fixed perfusion defect.  Therefore, she was sent for left and right heart catheterization in 1/16.  This showed mild elevation in LV and RV filling pressures and no significant CAD.  She was started on Lasix 20 mg daily.  Echo in 5/16 showed EF 20-25%, mildly dilated and mildly dysfunctional RV.  PFTs 4/16 showed severe obstruction and restriction.  She has seen Dr Melvyn Novas.  She is noted to have an elevated right hemidiaphragm.   In 6/16, she had Medtronic CRT-D system placed.  In 7/16, CPX showed low normal functional capacity with the main limitation being pulmonary.    She has done quite well since CRT-D placement.  Still has some dyspnea but better.  She is able to walk across [employer] without much problem.  No orthopnea/PND.  No chest pain.  Weight down 2 lbs.   Optivol: thoracic impedance stable with fluid index < threshold.   Labs (12/13): LDL 80, HDL 46, K 4, creatinine 0.7 Labs (6/14): K 4, creatinine 0.8, LDL 73, HDL 45 Labs (12/15): K 4.2, creatinine 0.7, HCT 43.3, LDL 60, HDL 51, BNP 577, TSH normal Labs (2/16): K 4.2, creatinine 0.7 Labs (1/16): SPEP no M spike K 4.3 Creatinine 0.74  Labs (2/16): K 4.2, creatinine 0.78 Labs (02/23/2015) : K 4.2 Creatinine 0.90, BNP 339 Labs (04/13/2015): K 4.5 Creatinine 1.01  Labs (05/17/2015): K 4.7 Creatinine 0.71   PMH: 1. HTN 2. Hyperlipidemia 3. COPD: Prior smoker.  PFTs  (4/16) with FVC 37%, FEV1 36%, ratio 97%, TLC 67%, DLCO 32% => severe obstruction and restriction.  COPD + elevated right hemidiaphragm.  4. Intermittent LBBB/IVCD 5. Hypothyroidism 6. Cardiomyopathy: Nonischemic.  Echo (6/13) with EF 45-50%, paradoxical septal motion.  LHC (6/13) with 40% D1 stenosis, 30% PLV stenosis.  Echo (12/15) with EF 10-15%, moderately dilated LV, diffuse hypokinesis, moderate MR.   Lexiscan Cardiolite (12/15) with EF 25%, diffuse hypokinesis, apical anteroseptal and apical fixed defect. LHC/RHC (1/16) with mean RA 8, PA 37/19, mean PCWP 19, CI 2.63, EF 30%, no significant CAD.  Cardiac MRI 12/20/2014: EF 24% Mod mitral regurg thought to be from dilated LV, normal RV size, no LGE.  Echo (5/16) with EF 20-25%, mildly dilated and mildly dysfunctional RV, mild MR. Medtronic CRT-D placed 6/16.  CPX (7/16) with peak VO2 16.3, VE/VCO2 slope 25, RER 1.1, low normal functional capacity limited primarily by lung disease.  7. 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  8. 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  Quit smoking in 1/16.    FH: Father CABG at 37   ROS: All systems reviewed and negative except as per HPI.   Current Outpatient Prescriptions  Medication Sig Dispense Refill  . aspirin 81 MG tablet Take 81 mg by mouth daily.    Marland Kitchen atorvastatin (  LIPITOR) 10 MG tablet TAKE 1 TABLET BY MOUTH AT BEDTIME 30 tablet 6  . bisoprolol (ZEBETA) 10 MG tablet Take 1 tablet (10 mg total) by mouth daily. 30 tablet 6  . Cholecalciferol (VITAMIN D-3 PO) Take 1,000 Units by mouth daily.     Marland Kitchen ENTRESTO 49-51 MG TAKE 1 TABLET BY MOUTH 2 (TWO) TIMES DAILY. 60 tablet 3  . furosemide (LASIX) 20 MG tablet TAKE 2 TABLETS (40 MG TOTAL) BY MOUTH DAILY. 60 tablet 3  . HYDROcodone-acetaminophen (NORCO) 5-325 MG per tablet Take 1 tablet by mouth at bedtime.     . hydroxychloroquine (PLAQUENIL) 200 MG tablet  Take 200 mg by mouth 2 (two) times daily.  5  . Inositol Niacinate (NIACIN FLUSH FREE) 500 MG CAPS Take 3 capsules by mouth daily.    Marland Kitchen levothyroxine (SYNTHROID, LEVOTHROID) 100 MCG tablet Take 100 mcg by mouth daily before breakfast.    . Multiple Vitamins-Minerals (MULTIVITAMIN WITH MINERALS) tablet Take 1 tablet by mouth daily.    . nitroGLYCERIN (NITROSTAT) 0.4 MG SL tablet Place 1 tablet (0.4 mg total) under the tongue every 5 (five) minutes as needed for chest pain. 25 tablet 3  . spironolactone (ALDACTONE) 25 MG tablet Take 0.5 tablets (12.5 mg total) by mouth daily. 15 tablet 3   No current facility-administered medications for this encounter.    BP 138/78 mmHg  Pulse 60  Wt 172 lb 8 oz (78.245 kg)  SpO2 98% General: NAD Husband present Neck: JVP not elevated, no thyromegaly or thyroid nodule.  Lungs: Decreased breath sounds right base. CV: Nondisplaced PMI.  Heart regular S1/S2, no S3/S4, 1/6 SEM.  No peripheral edema.  No carotid bruit.  Normal pedal pulses.  Abdomen: Soft, nontender, no hepatosplenomegaly, mild distention.  Neurologic: Alert and oriented x 3.  Psych: Normal affect. Extremities: No clubbing or cyanosis.   Assessment/Plan: 1. Cardiomyopathy: Nonischemic cardiomyopathy. In the past, she had EF 45-50% with no significant CAD (2013).  LBBB-related nonischemic cardiomyopathy.  However, in the fall of 2015 she developed exertional chest pain and dyspnea.  Echo and Cardiolite showed marked worsening of her LV systolic function (EF 123XX123 by echo) and Cardiolite suggested possible prior MI.  Left heart cath in 1/16 showed no significant CAD.  RHC showed mildly elevated right and left heart filling pressures and normal CI. SPEP negative and no infiltrative disease on cardiac MRI.  Repeat echo in 5/16 with EF 20-25%.  CPX showed limitations are related to lung disease as well as obesity, no HF limitation. Medtronic CRT-D placement. NYHA class II symptoms, no volume overload.   - Continue lasix Lasix 40 mg daily.     - Increase bisoprolol to 10 mg daily.  - Continue Entresto 49/51 mg bid.  - Continue spironolactone 12.5 mg daily  - Repeat echo to assess for improvement post-CRT. - BMET/BNP today.   2. Elevated right hemidiaphragm: This likely contributes significantly to dyspnea.   3. OSA:  Waiting for CPAP.     Followup in 3 months.  Loralie Champagne 09/17/2015

## 2015-09-18 ENCOUNTER — Other Ambulatory Visit (HOSPITAL_COMMUNITY): Payer: Self-pay | Admitting: Cardiology

## 2015-09-20 ENCOUNTER — Encounter: Payer: Self-pay | Admitting: Cardiology

## 2015-09-25 ENCOUNTER — Other Ambulatory Visit (HOSPITAL_COMMUNITY): Payer: Self-pay | Admitting: Internal Medicine

## 2015-09-26 ENCOUNTER — Telehealth (HOSPITAL_COMMUNITY): Payer: Self-pay

## 2015-09-26 MED ORDER — SACUBITRIL-VALSARTAN 49-51 MG PO TABS: ORAL_TABLET | ORAL | Status: DC

## 2015-09-26 NOTE — Telephone Encounter (Signed)
Refill for [facility] sent to [facility]

## 2015-10-04 ENCOUNTER — Other Ambulatory Visit: Payer: Self-pay

## 2015-10-04 ENCOUNTER — Ambulatory Visit (HOSPITAL_COMMUNITY): Payer: 59 | Attending: Cardiovascular Disease

## 2015-10-04 DIAGNOSIS — I517 Cardiomegaly: Secondary | ICD-10-CM | POA: Diagnosis not present

## 2015-10-04 DIAGNOSIS — I5022 Chronic systolic (congestive) heart failure: Secondary | ICD-10-CM | POA: Diagnosis not present

## 2015-10-04 DIAGNOSIS — I371 Nonrheumatic pulmonary valve insufficiency: Secondary | ICD-10-CM | POA: Insufficient documentation

## 2015-10-04 DIAGNOSIS — I071 Rheumatic tricuspid insufficiency: Secondary | ICD-10-CM | POA: Insufficient documentation

## 2015-10-04 DIAGNOSIS — I509 Heart failure, unspecified: Secondary | ICD-10-CM | POA: Diagnosis present

## 2015-10-04 DIAGNOSIS — I340 Nonrheumatic mitral (valve) insufficiency: Secondary | ICD-10-CM | POA: Insufficient documentation

## 2015-10-12 ENCOUNTER — Telehealth: Payer: Self-pay | Admitting: Cardiology

## 2015-10-12 NOTE — Telephone Encounter (Signed)
New Message  Rep from [employer] for a copy of pt's order of a CPAP from Dr Radford Pax. Please call back and discuss.

## 2015-10-12 NOTE — Telephone Encounter (Signed)
Left message for Rep to call me back to discuss

## 2015-10-14 ENCOUNTER — Other Ambulatory Visit: Payer: Self-pay | Admitting: Internal Medicine

## 2015-10-17 NOTE — Telephone Encounter (Signed)
Paperwork faxed to insurance company on 10/12/15

## 2015-10-27 ENCOUNTER — Encounter: Payer: Self-pay | Admitting: Cardiology

## 2015-10-27 ENCOUNTER — Telehealth (HOSPITAL_COMMUNITY): Payer: Self-pay | Admitting: Cardiology

## 2015-10-27 NOTE — Telephone Encounter (Signed)
Pt called to request samples of entresto Pt is switching insurance and there will be a gap between coverages  Medication Samples have been provided to the patient.  Drug name: entresto 49/51 Qty: 28  LOT: [id number]  Exp.Date: 10/18     Oty: 28  LOT:[id number]  Ex.Date: 03/17  The patient has been instructed regarding the correct time, dose, and frequency of taking this medication, including desired effects and most common side effects.   Kerry Dory 11:48 AM 10/27/2015

## 2015-11-10 ENCOUNTER — Ambulatory Visit (INDEPENDENT_AMBULATORY_CARE_PROVIDER_SITE_OTHER): Payer: 59 | Admitting: *Deleted

## 2015-11-10 DIAGNOSIS — I429 Cardiomyopathy, unspecified: Secondary | ICD-10-CM

## 2015-11-10 DIAGNOSIS — I5022 Chronic systolic (congestive) heart failure: Secondary | ICD-10-CM | POA: Diagnosis not present

## 2015-11-11 NOTE — Progress Notes (Signed)
Remote ICD transmission.   

## 2015-11-20 LAB — CUP PACEART REMOTE DEVICE CHECK
Battery Remaining Longevity: 99 mo
Battery Voltage: 3.03 V
Brady Statistic AP VP Percent: 28.73 %
Brady Statistic AP VS Percent: 0.12 %
Brady Statistic AS VP Percent: 70.23 %
Brady Statistic AS VS Percent: 0.93 %
Brady Statistic RA Percent Paced: 28.84 %
Brady Statistic RV Percent Paced: 98.71 %
Date Time Interrogation Session: 20170105151633
HighPow Impedance: 69 Ohm
Implantable Lead Implant Date: 20160610
Implantable Lead Implant Date: 20160610
Implantable Lead Implant Date: 20160610
Implantable Lead Location: 753858
Implantable Lead Location: 753859
Implantable Lead Location: 753860
Implantable Lead Model: 4396
Implantable Lead Model: 5076
Lead Channel Impedance Value: 418 Ohm
Lead Channel Impedance Value: 456 Ohm
Lead Channel Impedance Value: 475 Ohm
Lead Channel Impedance Value: 532 Ohm
Lead Channel Impedance Value: 589 Ohm
Lead Channel Impedance Value: 779 Ohm
Lead Channel Pacing Threshold Amplitude: 0.375 V
Lead Channel Pacing Threshold Amplitude: 0.625 V
Lead Channel Pacing Threshold Amplitude: 1.125 V
Lead Channel Pacing Threshold Pulse Width: 0.4 ms
Lead Channel Pacing Threshold Pulse Width: 0.4 ms
Lead Channel Pacing Threshold Pulse Width: 0.4 ms
Lead Channel Sensing Intrinsic Amplitude: 17.625 mV
Lead Channel Sensing Intrinsic Amplitude: 17.625 mV
Lead Channel Sensing Intrinsic Amplitude: 4.75 mV
Lead Channel Sensing Intrinsic Amplitude: 4.75 mV
Lead Channel Setting Pacing Amplitude: 1.5 V
Lead Channel Setting Pacing Amplitude: 2 V
Lead Channel Setting Pacing Amplitude: 2.25 V
Lead Channel Setting Pacing Pulse Width: 0.4 ms
Lead Channel Setting Pacing Pulse Width: 0.4 ms
Lead Channel Setting Sensing Sensitivity: 0.3 mV

## 2015-11-23 ENCOUNTER — Encounter: Payer: Self-pay | Admitting: Cardiology

## 2015-12-07 ENCOUNTER — Other Ambulatory Visit (HOSPITAL_COMMUNITY): Payer: Self-pay | Admitting: Cardiology

## 2015-12-22 ENCOUNTER — Ambulatory Visit: Payer: 59 | Admitting: Pulmonary Disease

## 2016-01-13 ENCOUNTER — Other Ambulatory Visit (HOSPITAL_COMMUNITY): Payer: Self-pay | Admitting: Cardiology

## 2016-02-09 ENCOUNTER — Ambulatory Visit (INDEPENDENT_AMBULATORY_CARE_PROVIDER_SITE_OTHER): Payer: 59 | Admitting: *Deleted

## 2016-02-09 DIAGNOSIS — I5022 Chronic systolic (congestive) heart failure: Secondary | ICD-10-CM

## 2016-02-09 DIAGNOSIS — I429 Cardiomyopathy, unspecified: Secondary | ICD-10-CM | POA: Diagnosis not present

## 2016-02-09 NOTE — Progress Notes (Signed)
Remote ICD transmission.   

## 2016-03-11 ENCOUNTER — Other Ambulatory Visit (HOSPITAL_COMMUNITY): Payer: Self-pay | Admitting: Cardiology

## 2016-03-23 LAB — CUP PACEART REMOTE DEVICE CHECK
Battery Remaining Longevity: 96 mo
Battery Voltage: 3.02 V
Brady Statistic AP VP Percent: 32.77 %
Brady Statistic AP VS Percent: 0.15 %
Brady Statistic AS VP Percent: 66.25 %
Brady Statistic AS VS Percent: 0.82 %
Brady Statistic RA Percent Paced: 32.92 %
Brady Statistic RV Percent Paced: 98.78 %
Date Time Interrogation Session: 20170406073424
HighPow Impedance: 66 Ohm
Implantable Lead Implant Date: 20160610
Implantable Lead Implant Date: 20160610
Implantable Lead Implant Date: 20160610
Implantable Lead Location: 753858
Implantable Lead Location: 753859
Implantable Lead Location: 753860
Implantable Lead Model: 4396
Implantable Lead Model: 5076
Lead Channel Impedance Value: 399 Ohm
Lead Channel Impedance Value: 399 Ohm
Lead Channel Impedance Value: 456 Ohm
Lead Channel Impedance Value: 513 Ohm
Lead Channel Impedance Value: 589 Ohm
Lead Channel Impedance Value: 760 Ohm
Lead Channel Pacing Threshold Amplitude: 0.375 V
Lead Channel Pacing Threshold Amplitude: 0.625 V
Lead Channel Pacing Threshold Amplitude: 1.125 V
Lead Channel Pacing Threshold Pulse Width: 0.4 ms
Lead Channel Pacing Threshold Pulse Width: 0.4 ms
Lead Channel Pacing Threshold Pulse Width: 0.4 ms
Lead Channel Sensing Intrinsic Amplitude: 17.875 mV
Lead Channel Sensing Intrinsic Amplitude: 17.875 mV
Lead Channel Sensing Intrinsic Amplitude: 4.625 mV
Lead Channel Sensing Intrinsic Amplitude: 4.625 mV
Lead Channel Setting Pacing Amplitude: 1.5 V
Lead Channel Setting Pacing Amplitude: 2 V
Lead Channel Setting Pacing Amplitude: 2.25 V
Lead Channel Setting Pacing Pulse Width: 0.4 ms
Lead Channel Setting Pacing Pulse Width: 0.4 ms
Lead Channel Setting Sensing Sensitivity: 0.3 mV

## 2016-03-27 ENCOUNTER — Encounter (HOSPITAL_COMMUNITY): Payer: Self-pay

## 2016-03-27 ENCOUNTER — Encounter: Payer: Self-pay | Admitting: Cardiology

## 2016-03-27 ENCOUNTER — Ambulatory Visit (HOSPITAL_COMMUNITY)
Admission: RE | Admit: 2016-03-27 | Discharge: 2016-03-27 | Disposition: A | Discharge status: Home / Self Care | Payer: 59 | Source: Ambulatory Visit | Attending: Cardiology | Admitting: Cardiology

## 2016-03-27 VITALS — BP 126/72 | HR 67 | Wt 174.0 lb

## 2016-03-27 DIAGNOSIS — I10 Essential (primary) hypertension: Secondary | ICD-10-CM | POA: Diagnosis not present

## 2016-03-27 DIAGNOSIS — I2510 Atherosclerotic heart disease of native coronary artery without angina pectoris: Secondary | ICD-10-CM

## 2016-03-27 DIAGNOSIS — E669 Obesity, unspecified: Secondary | ICD-10-CM | POA: Insufficient documentation

## 2016-03-27 DIAGNOSIS — Z79899 Other long term (current) drug therapy: Secondary | ICD-10-CM | POA: Diagnosis not present

## 2016-03-27 DIAGNOSIS — I252 Old myocardial infarction: Secondary | ICD-10-CM | POA: Insufficient documentation

## 2016-03-27 DIAGNOSIS — E785 Hyperlipidemia, unspecified: Secondary | ICD-10-CM | POA: Insufficient documentation

## 2016-03-27 DIAGNOSIS — Z7982 Long term (current) use of aspirin: Secondary | ICD-10-CM | POA: Insufficient documentation

## 2016-03-27 DIAGNOSIS — Z87891 Personal history of nicotine dependence: Secondary | ICD-10-CM | POA: Diagnosis not present

## 2016-03-27 DIAGNOSIS — G4733 Obstructive sleep apnea (adult) (pediatric): Secondary | ICD-10-CM | POA: Diagnosis not present

## 2016-03-27 DIAGNOSIS — I429 Cardiomyopathy, unspecified: Secondary | ICD-10-CM | POA: Diagnosis not present

## 2016-03-27 DIAGNOSIS — I5022 Chronic systolic (congestive) heart failure: Secondary | ICD-10-CM | POA: Diagnosis not present

## 2016-03-27 DIAGNOSIS — I447 Left bundle-branch block, unspecified: Secondary | ICD-10-CM | POA: Insufficient documentation

## 2016-03-27 DIAGNOSIS — J449 Chronic obstructive pulmonary disease, unspecified: Secondary | ICD-10-CM | POA: Insufficient documentation

## 2016-03-27 DIAGNOSIS — E039 Hypothyroidism, unspecified: Secondary | ICD-10-CM | POA: Insufficient documentation

## 2016-03-27 DIAGNOSIS — R079 Chest pain, unspecified: Secondary | ICD-10-CM | POA: Insufficient documentation

## 2016-03-27 LAB — BASIC METABOLIC PANEL
Anion gap: 4 — ABNORMAL LOW (ref 5–15)
BUN: 11 mg/dL (ref 6–20)
CO2: 30 mmol/L (ref 22–32)
Calcium: 9.7 mg/dL (ref 8.9–10.3)
Chloride: 106 mmol/L (ref 101–111)
Creatinine, Ser: 0.84 mg/dL (ref 0.44–1.00)
GFR calc Af Amer: >60 mL/min (ref >60)
GFR calc non Af Amer: >60 mL/min (ref >60)
Glucose, Bld: 113 mg/dL — ABNORMAL HIGH (ref 65–99)
Potassium: 4 mmol/L (ref 3.5–5.1)
Sodium: 140 mmol/L (ref 135–145)

## 2016-03-27 LAB — LIPID PANEL
Cholesterol: 139 mg/dL (ref 0–200)
HDL: 38 mg/dL — ABNORMAL LOW (ref >40)
LDL Cholesterol: 70 mg/dL (ref 0–99)
Total CHOL/HDL Ratio: 3.7 RATIO
Triglycerides: 154 mg/dL — ABNORMAL HIGH (ref <150)
VLDL: 31 mg/dL (ref 0–40)

## 2016-03-27 LAB — BRAIN NATRIURETIC PEPTIDE: B Natriuretic Peptide: 73.5 pg/mL (ref 0.0–100.0)

## 2016-03-27 MED ORDER — SPIRONOLACTONE 25 MG PO TABS: 25.0000 mg | ORAL_TABLET | Freq: Every day | ORAL | Status: DC

## 2016-03-27 NOTE — Patient Instructions (Signed)
Increase Spironolactone to 25 mg (1 tab) daily  Labs today  Labs in 10 days  Your physician recommends that you schedule a follow-up appointment in: 4 months

## 2016-03-28 NOTE — Progress Notes (Signed)
Patient ID: Stacy Hernandez, female   DOB: Jun 03, 1956, 60 y.o.   MRN: [id number] PCP: Dr. Shelia Media EP: Dr Lovena Le Cardiology: Dr Aundra Dubin   60 yo with history of HTN, intermittent LBBB, and nonischemic cardiomyopathy presents for cardiology followup.  She was admitted with chest pain in 6/13.  ECG showed intermittent LBBB.  Echo showed EF 45-50%.  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    Developed CP and dyspnea. She was set up for echo and Cardiolite in 12/15.  Echo showed EF 10-15% (significant fall).  She had a Lexiscan Cardiolite with EF 25% and apical anteroseptal and apical fixed perfusion defect.  Therefore, she was sent for left and right heart catheterization in 1/16.  This showed mild elevation in LV and RV filling pressures and no significant CAD.  She was started on Lasix 20 mg daily.  Echo in 5/16 showed EF 20-25%, mildly dilated and mildly dysfunctional RV.  PFTs 4/16 showed severe obstruction and restriction.  She has seen Dr Melvyn Novas.  She is noted to have an elevated right hemidiaphragm.   In 6/16, she had Medtronic CRT-D system placed.  In 7/16, CPX showed low normal functional capacity with the main limitation being pulmonary.  Repeat echo in 11/16 showed improvement in EF to 40-45%.    She has done quite well since last appointment.  Breathing is much better overall.  No dyspnea with walking on flat ground or climbing a flight of steps.  No chest pain.  Some dyspnea with heavy yardwork.  No orthopnea/PND.     Labs (12/13): LDL 80, HDL 46, K 4, creatinine 0.7 Labs (6/14): K 4, creatinine 0.8, LDL 73, HDL 45 Labs (12/15): K 4.2, creatinine 0.7, HCT 43.3, LDL 60, HDL 51, BNP 577, TSH normal Labs (2/16): K 4.2, creatinine 0.7 Labs (1/16): SPEP no M spike K 4.3 Creatinine 0.74  Labs (2/16): K 4.2, creatinine 0.78 Labs (02/23/2015) : K 4.2 Creatinine 0.90, BNP 339 Labs (04/13/2015): K 4.5 Creatinine 1.01  Labs (05/17/2015): K 4.7 Creatinine 0.71  Labs (11/16): K 4.1, creatinine  0.74  PMH: 1. HTN 2. Hyperlipidemia 3. COPD: Prior smoker.  PFTs (4/16) with FVC 37%, FEV1 36%, ratio 97%, TLC 67%, DLCO 32% => severe obstruction and restriction.  COPD + elevated right hemidiaphragm.  4. Intermittent LBBB/IVCD 5. Hypothyroidism 6. Cardiomyopathy: Nonischemic.  Echo (6/13) with EF 45-50%, paradoxical septal motion.  LHC (6/13) with 40% D1 stenosis, 30% PLV stenosis.  Echo (12/15) with EF 10-15%, moderately dilated LV, diffuse hypokinesis, moderate MR.   Lexiscan Cardiolite (12/15) with EF 25%, diffuse hypokinesis, apical anteroseptal and apical fixed defect. LHC/RHC (1/16) with mean RA 8, PA 37/19, mean PCWP 19, CI 2.63, EF 30%, no significant CAD.  Cardiac MRI 12/20/2014: EF 24% Mod mitral regurg thought to be from dilated LV, normal RV size, no LGE.  Echo (5/16) with EF 20-25%, mildly dilated and mildly dysfunctional RV, mild MR. Medtronic CRT-D placed 6/16.  CPX (7/16) with peak VO2 16.3, VE/VCO2 slope 25, RER 1.1, low normal functional capacity limited primarily by lung disease.  - Echo (11/16) with EF 40-45%, mild MR.  7. 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  8. 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  Quit smoking in 1/16.    FH: Father CABG at 2   ROS: All systems reviewed and negative except as per HPI.   Current Outpatient Prescriptions  Medication  Sig Dispense Refill  . aspirin 81 MG tablet Take 81 mg by mouth daily.    Marland Kitchen atorvastatin (LIPITOR) 10 MG tablet TAKE 1 TABLET BY MOUTH AT BEDTIME 30 tablet 6  . bisoprolol (ZEBETA) 10 MG tablet Take 1 tablet (10 mg total) by mouth daily. 30 tablet 6  . Cholecalciferol (VITAMIN D-3 PO) Take 1,000 Units by mouth daily.     Marland Kitchen ENTRESTO 49-51 MG TAKE 1 TABLET BY MOUTH 2 (TWO) TIMES DAILY. 60 tablet 3  . furosemide (LASIX) 20 MG tablet TAKE 2 TABLETS (40 MG TOTAL) BY MOUTH DAILY. 60 tablet 3  . HYDROcodone-acetaminophen  (NORCO) 5-325 MG per tablet Take 1 tablet by mouth at bedtime.     . hydroxychloroquine (PLAQUENIL) 200 MG tablet Take 200 mg by mouth 2 (two) times daily.  5  . Inositol Niacinate (NIACIN FLUSH FREE) 500 MG CAPS Take 3 capsules by mouth daily.    Marland Kitchen levothyroxine (SYNTHROID, LEVOTHROID) 100 MCG tablet Take 100 mcg by mouth daily before breakfast.    . Multiple Vitamins-Minerals (MULTIVITAMIN WITH MINERALS) tablet Take 1 tablet by mouth daily.    . nitroGLYCERIN (NITROSTAT) 0.4 MG SL tablet Place 1 tablet (0.4 mg total) under the tongue every 5 (five) minutes as needed for chest pain. 25 tablet 3  . spironolactone (ALDACTONE) 25 MG tablet Take 1 tablet (25 mg total) by mouth daily. 30 tablet 6   No current facility-administered medications for this encounter.    BP 126/72 mmHg  Pulse 67  Wt 174 lb (78.926 kg)  SpO2 98% General: NAD  Neck: JVP not elevated, no thyromegaly or thyroid nodule.  Lungs: Decreased breath sounds right base. CV: Nondisplaced PMI.  Heart regular S1/S2, no S3/S4, 1/6 SEM.  No peripheral edema.  No carotid bruit.  Normal pedal pulses.  Abdomen: Soft, nontender, no hepatosplenomegaly, mild distention.  Neurologic: Alert and oriented x 3.  Psych: Normal affect. Extremities: No clubbing or cyanosis.   Assessment/Plan: 1. Cardiomyopathy: Nonischemic cardiomyopathy. In the past, she had EF 45-50% with no significant CAD (2013).  LBBB-related nonischemic cardiomyopathy.  However, in the fall of 2015 she developed exertional chest pain and dyspnea.  Echo and Cardiolite showed marked worsening of her LV systolic function (EF 123XX123 by echo) and Cardiolite suggested possible prior MI.  Left heart cath in 1/16 showed no significant CAD.  RHC showed mildly elevated right and left heart filling pressures and normal CI. SPEP negative and no infiltrative disease on cardiac MRI.  Repeat echo in 5/16 with EF 20-25%.  CPX showed limitations are related to lung disease as well as obesity,  no HF limitation. Medtronic CRT-D placement.  Followup echo in 11/16 showed improvement in EF to 40-45%.  NYHA class II symptoms, no volume overload.  - Continue Lasix 40 mg daily.     - Continue bisoprolol 10 mg daily.  - Continue Entresto 49/51 mg bid.  - Increase spironolactone to 25 mg daily.  BMET/BNP today and repeat in 10 days.    2. Elevated right hemidiaphragm: This likely contributes significantly to dyspnea.   3. OSA:  On CPAP.      Followup in 4 months.  Loralie Champagne 03/28/2016

## 2016-04-10 ENCOUNTER — Other Ambulatory Visit (HOSPITAL_COMMUNITY): Payer: Self-pay | Admitting: Cardiology

## 2016-04-11 ENCOUNTER — Telehealth (HOSPITAL_COMMUNITY): Payer: Self-pay

## 2016-04-11 NOTE — Telephone Encounter (Signed)
Patient called CHF clinic traige stating she has misplaced Rx for lab draws per Dr. Aundra Dubin order. Will mail new Rx to confirmed address in chart as verified by patient.  Renee Pain

## 2016-04-15 ENCOUNTER — Other Ambulatory Visit (HOSPITAL_COMMUNITY): Payer: Self-pay | Admitting: Cardiology

## 2016-04-16 ENCOUNTER — Other Ambulatory Visit: Payer: Self-pay

## 2016-04-16 ENCOUNTER — Other Ambulatory Visit: Payer: Self-pay | Admitting: Cardiology

## 2016-04-16 MED ORDER — BISOPROLOL FUMARATE 10 MG PO TABS: 10.0000 mg | ORAL_TABLET | Freq: Every day | ORAL | Status: DC

## 2016-04-17 LAB — BASIC METABOLIC PANEL
BUN/Creatinine Ratio: 22 (ref 9–23)
BUN: 19 mg/dL (ref 6–24)
CO2: 24 mmol/L (ref 18–29)
Calcium: 9.7 mg/dL (ref 8.7–10.2)
Chloride: 103 mmol/L (ref 96–106)
Creatinine, Ser: 0.85 mg/dL (ref 0.57–1.00)
GFR calc Af Amer: 87 mL/min/{1.73_m2} (ref >59)
GFR calc non Af Amer: 75 mL/min/{1.73_m2} (ref >59)
Glucose: 88 mg/dL (ref 65–99)
Potassium: 4.2 mmol/L (ref 3.5–5.2)
Sodium: 142 mmol/L (ref 134–144)

## 2016-04-17 LAB — BRAIN NATRIURETIC PEPTIDE: BNP: 75.7 pg/mL (ref 0.0–100.0)

## 2016-04-18 ENCOUNTER — Telehealth (HOSPITAL_COMMUNITY): Payer: Self-pay | Admitting: *Deleted

## 2016-04-18 NOTE — Telephone Encounter (Signed)
Faxed records to [facility] [phone] (fax #)

## 2016-04-19 ENCOUNTER — Other Ambulatory Visit (HOSPITAL_COMMUNITY): Payer: Self-pay | Admitting: Cardiology

## 2016-05-02 ENCOUNTER — Other Ambulatory Visit: Payer: Self-pay | Admitting: Internal Medicine

## 2016-05-02 NOTE — Telephone Encounter (Signed)
Please advise 

## 2016-05-04 ENCOUNTER — Encounter: Payer: Self-pay | Admitting: Cardiology

## 2016-05-07 ENCOUNTER — Other Ambulatory Visit: Payer: Self-pay | Admitting: *Deleted

## 2016-05-07 MED ORDER — FUROSEMIDE 20 MG PO TABS: 20.0000 mg | ORAL_TABLET | Freq: Two times a day (BID) | ORAL | Status: DC

## 2016-05-21 ENCOUNTER — Encounter: Payer: Self-pay | Admitting: Internal Medicine

## 2016-05-21 ENCOUNTER — Other Ambulatory Visit: Payer: Self-pay

## 2016-05-21 ENCOUNTER — Ambulatory Visit (INDEPENDENT_AMBULATORY_CARE_PROVIDER_SITE_OTHER): Payer: 59 | Admitting: Internal Medicine

## 2016-05-21 VITALS — BP 122/78 | HR 60 | Ht 62.0 in | Wt 177.4 lb

## 2016-05-21 DIAGNOSIS — I5022 Chronic systolic (congestive) heart failure: Secondary | ICD-10-CM | POA: Diagnosis not present

## 2016-05-21 LAB — CUP PACEART INCLINIC DEVICE CHECK
Battery Remaining Longevity: 95 mo
Battery Voltage: 3.01 V
Brady Statistic AP VP Percent: 26.41 %
Brady Statistic AP VS Percent: 0.11 %
Brady Statistic AS VP Percent: 72.52 %
Brady Statistic AS VS Percent: 0.95 %
Brady Statistic RA Percent Paced: 26.53 %
Brady Statistic RV Percent Paced: 98.66 %
Date Time Interrogation Session: 20170717091610
HighPow Impedance: 65 Ohm
Implantable Lead Implant Date: 20160610
Implantable Lead Implant Date: 20160610
Implantable Lead Implant Date: 20160610
Implantable Lead Location: 753858
Implantable Lead Location: 753859
Implantable Lead Location: 753860
Implantable Lead Model: 4396
Implantable Lead Model: 5076
Lead Channel Impedance Value: 399 Ohm
Lead Channel Impedance Value: 418 Ohm
Lead Channel Impedance Value: 475 Ohm
Lead Channel Impedance Value: 532 Ohm
Lead Channel Impedance Value: 589 Ohm
Lead Channel Impedance Value: 817 Ohm
Lead Channel Pacing Threshold Amplitude: 0.375 V
Lead Channel Pacing Threshold Amplitude: 0.625 V
Lead Channel Pacing Threshold Amplitude: 1 V
Lead Channel Pacing Threshold Pulse Width: 0.4 ms
Lead Channel Pacing Threshold Pulse Width: 0.4 ms
Lead Channel Pacing Threshold Pulse Width: 0.4 ms
Lead Channel Sensing Intrinsic Amplitude: 18 mV
Lead Channel Sensing Intrinsic Amplitude: 5.375 mV
Lead Channel Setting Pacing Amplitude: 1.5 V
Lead Channel Setting Pacing Amplitude: 2 V
Lead Channel Setting Pacing Amplitude: 2 V
Lead Channel Setting Pacing Pulse Width: 0.4 ms
Lead Channel Setting Pacing Pulse Width: 0.4 ms
Lead Channel Setting Sensing Sensitivity: 0.3 mV

## 2016-05-21 NOTE — Progress Notes (Signed)
HPI Stacy Hernandez returns today for ongoing evaluation and management of her ICD. She is a 60 yo woman with multiple medical problems including chronic systolic heart failure, HTN, COPD with a h/o tobacco abuse, and obesity. She underwent CPX testing several months ago, after her BiV ICD and this demonstrated a pulmonary functional limitation. She thinks she has had a modest improvement in her dyspnea since her BiV. In the interim, she has stopped smoking. She admits to being sedentary.  Allergies  Allergen Reactions  . Prednisone Nausea Only     Current Outpatient Prescriptions  Medication Sig Dispense Refill  . aspirin 81 MG tablet Take 81 mg by mouth daily.    Marland Kitchen atorvastatin (LIPITOR) 10 MG tablet TAKE 1 TABLET BY MOUTH AT BEDTIME 30 tablet 6  . bisoprolol (ZEBETA) 10 MG tablet Take 1 tablet (10 mg total) by mouth daily. 30 tablet 6  . Cholecalciferol (VITAMIN D-3 PO) Take 1,000 Units by mouth daily.     Marland Kitchen ENTRESTO 49-51 MG TAKE 1 TABLET BY MOUTH 2 (TWO) TIMES DAILY. 60 tablet 3  . furosemide (LASIX) 20 MG tablet Take 1 tablet (20 mg total) by mouth 2 (two) times daily. 60 tablet 11  . HYDROcodone-acetaminophen (NORCO) 5-325 MG per tablet Take 1 tablet by mouth at bedtime.     . hydroxychloroquine (PLAQUENIL) 200 MG tablet Take 200 mg by mouth 2 (two) times daily.  5  . Inositol Niacinate (NIACIN FLUSH FREE) 500 MG CAPS Take 3 capsules by mouth daily.    Marland Kitchen levothyroxine (SYNTHROID, LEVOTHROID) 100 MCG tablet Take 100 mcg by mouth daily before breakfast.    . Multiple Vitamins-Minerals (MULTIVITAMIN WITH MINERALS) tablet Take 1 tablet by mouth daily.    . nitroGLYCERIN (NITROSTAT) 0.4 MG SL tablet Place 1 tablet (0.4 mg total) under the tongue every 5 (five) minutes as needed for chest pain. 25 tablet 3  . spironolactone (ALDACTONE) 25 MG tablet Take 1 tablet (25 mg total) by mouth daily. 30 tablet 3   No current facility-administered medications for this visit.     Past Medical  History  Diagnosis Date  . Thyroid disease     Post-ablation hypothyroidism  . Hypertriglyceridemia   . Hypercalcemia     Due to HCTZ, improved with d/c of calcium supplements  . Nephrolithiasis   . Osteoporosis   . Tobacco abuse   . Back pain     Recent positive ANA but told she has OA  . Cardiomyopathy ([place])     a.  EF 45-50% by echo 04/23/2012;  b.  Echo (12/15):  EF 10-15%, Gr 2 DD, ventricular septum dyssynergy, mod MR, mod LAE.  Marland Kitchen CAD (coronary artery disease)     a.  per cath in June 2013 with nonobstructive CAD;  b.  Lexiscan Myoview (12/15):  No ischemia.  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 Hypertension   . AICD (automatic cardioverter/defibrillator) present 04/15/2015    Medtronic (serial Number [id number]) biventricular ICD  . COPD (chronic obstructive pulmonary disease) ([place])   . OA (osteoarthritis)   . Sleep apnea     ROS:   All systems reviewed and negative except as noted in the HPI.   Past Surgical History  Procedure Laterality Date  . Bladder surgery      bladder & bowel tac with mesh  . Partial hysterectomy      Due to cervical dysplasia  .  Cesarean section    . Tonsillectomy    . Carpal tunnel release    . Left heart catheterization with coronary angiogram N/A 04/22/2012    Procedure: LEFT HEART CATHETERIZATION WITH CORONARY ANGIOGRAM;  Surgeon: Hillary Bow, MD;  Location: [facility] CATH LAB;  Service: Cardiovascular;  Laterality: N/A;  . Left and right heart catheterization with coronary angiogram N/A 11/12/2014    Procedure: LEFT AND RIGHT HEART CATHETERIZATION WITH CORONARY ANGIOGRAM;  Surgeon: Larey Dresser, MD;  Location: [facility] CATH LAB;  Service: Cardiovascular;  Laterality: N/A;  . Ep implantable device N/A 04/15/2015    Procedure: BiV ICD Insertion CRT-D;  Surgeon: Evans Lance, MD;  Medtronic (serial Number [id number]) biventricular ICD     Family History  Problem Relation Age of  Onset  . Coronary artery disease Father     s/p CABG in his 87s  . Hypertension Father   . COPD Mother     smoked  . Colon polyps Mother   . Diabetes Sister   . Cirrhosis Sister   . Colon cancer Maternal Uncle 78  . Heart attack Father   . Stroke Neg Hx      Social History   Social History  . Marital Status: Married    Spouse Name: N/A  . Number of Children: N/A  . Years of Education: N/A   Occupational History  . Not on file.   Social History Main Topics  . Smoking status: Former Smoker -- 2.00 packs/day for 35 years    Types: Cigarettes    Quit date: 11/05/2014  . Smokeless tobacco: Never Used  . Alcohol Use: No  . Drug Use: No  . Sexual Activity: Not Currently   Other Topics Concern  . Not on file   Social History Narrative     BP 122/78 mmHg  Pulse 60  Ht 5\' 2"  (1.575 m)  Wt 177 lb 6.4 oz (80.468 kg)  BMI 32.44 kg/m2  Physical Exam:  Well appearing 60 yo woman, NAD HEENT: Unremarkable Neck:  6 cm JVD, no thyromegally Lymphatics:  No adenopathy Back:  No CVA tenderness Lungs:  Clear but decreased breath sounds. HEART:  Regular rate rhythm, no murmurs, no rubs, no clicks Abd:  soft, positive bowel sounds, no organomegally, no rebound, no guarding Ext:  2 plus pulses, no edema, no cyanosis, no clubbing Skin:  No rashes no nodules Neuro:  CN II through XII intact, motor grossly intact  Device followup - normal device function.  ECG - NSR with biv pacing  Assess/Plan: 1. Chronic systolic heart failure - her symptoms are class 2. She will continue her current meds.  2. Obesity - she has gained 10 lbs. I have asked her to lose weight. 3. ICD - her medtronic BiV ICD is working normally. Will recheck in several months. 4. Tobacco abuse - she is in remission. Will follow.  Mikle Bosworth.D.

## 2016-05-21 NOTE — Patient Instructions (Signed)
Medication Instructions:  Your physician recommends that you continue on your current medications as directed. Please refer to the Current Medication list given to you today.  ---- If you need a refill on your cardiac medications before your next appointment, please call your pharmacy.  Labwork: None ordered  Testing/Procedures: None ordered  Follow-Up: Remote monitoring is used to monitor your Pacemaker of ICD from home. This monitoring reduces the number of office visits required to check your device to one time per year. It allows [place] to keep an eye on the functioning of your device to ensure it is working properly. You are scheduled for a device check from home on 08/20/2016. You may send your transmission at any time that day. If you have a wireless device, the transmission will be sent automatically. After your physician reviews your transmission, you will receive a postcard with your next transmission date.  Your physician wants you to follow-up in: 1 year with Dr. Lovena Le.  You will receive a reminder letter in the mail two months in advance. If you don't receive a letter, please call our office to schedule the follow-up appointment.  Thank you for choosing [facility]!!

## 2016-07-01 ENCOUNTER — Other Ambulatory Visit (HOSPITAL_COMMUNITY): Payer: Self-pay | Admitting: Cardiology

## 2016-08-20 ENCOUNTER — Telehealth: Payer: Self-pay | Admitting: Cardiology

## 2016-08-20 ENCOUNTER — Ambulatory Visit (INDEPENDENT_AMBULATORY_CARE_PROVIDER_SITE_OTHER): Payer: 59 | Admitting: *Deleted

## 2016-08-20 DIAGNOSIS — I428 Other cardiomyopathies: Secondary | ICD-10-CM | POA: Diagnosis not present

## 2016-08-20 NOTE — Telephone Encounter (Signed)
Spoke with pt and reminded pt of remote transmission that is due today. Pt verbalized understanding.   

## 2016-08-21 NOTE — Progress Notes (Signed)
Remote ICD transmission.   

## 2016-08-22 ENCOUNTER — Encounter: Payer: Self-pay | Admitting: Cardiology

## 2016-08-27 ENCOUNTER — Other Ambulatory Visit: Payer: Self-pay | Admitting: Internal Medicine

## 2016-08-27 DIAGNOSIS — Z1231 Encounter for screening mammogram for malignant neoplasm of breast: Secondary | ICD-10-CM

## 2016-08-31 LAB — CUP PACEART REMOTE DEVICE CHECK
Battery Remaining Longevity: 92 mo
Battery Voltage: 3.01 V
Brady Statistic AP VP Percent: 22.14 %
Brady Statistic AP VS Percent: 0.09 %
Brady Statistic AS VP Percent: 76.75 %
Brady Statistic AS VS Percent: 1.02 %
Brady Statistic RA Percent Paced: 22.23 %
Brady Statistic RV Percent Paced: 98.67 %
Date Time Interrogation Session: 20171016221705
HighPow Impedance: 67 Ohm
Implantable Lead Implant Date: 20160610
Implantable Lead Implant Date: 20160610
Implantable Lead Implant Date: 20160610
Implantable Lead Location: 753858
Implantable Lead Location: 753859
Implantable Lead Location: 753860
Implantable Lead Model: 4396
Implantable Lead Model: 5076
Lead Channel Impedance Value: 399 Ohm
Lead Channel Impedance Value: 418 Ohm
Lead Channel Impedance Value: 456 Ohm
Lead Channel Impedance Value: 551 Ohm
Lead Channel Impedance Value: 608 Ohm
Lead Channel Impedance Value: 779 Ohm
Lead Channel Pacing Threshold Amplitude: 0.375 V
Lead Channel Pacing Threshold Amplitude: 0.625 V
Lead Channel Pacing Threshold Amplitude: 0.875 V
Lead Channel Pacing Threshold Pulse Width: 0.4 ms
Lead Channel Pacing Threshold Pulse Width: 0.4 ms
Lead Channel Pacing Threshold Pulse Width: 0.4 ms
Lead Channel Sensing Intrinsic Amplitude: 16.625 mV
Lead Channel Sensing Intrinsic Amplitude: 16.625 mV
Lead Channel Sensing Intrinsic Amplitude: 4.75 mV
Lead Channel Sensing Intrinsic Amplitude: 4.75 mV
Lead Channel Setting Pacing Amplitude: 1.5 V
Lead Channel Setting Pacing Amplitude: 2 V
Lead Channel Setting Pacing Amplitude: 2 V
Lead Channel Setting Pacing Pulse Width: 0.4 ms
Lead Channel Setting Pacing Pulse Width: 0.4 ms
Lead Channel Setting Sensing Sensitivity: 0.3 mV

## 2016-09-17 ENCOUNTER — Ambulatory Visit
Admission: RE | Admit: 2016-09-17 | Discharge: 2016-09-17 | Disposition: A | Discharge status: Home / Self Care | Payer: 59 | Source: Ambulatory Visit | Attending: Internal Medicine | Admitting: Internal Medicine

## 2016-09-17 DIAGNOSIS — Z1231 Encounter for screening mammogram for malignant neoplasm of breast: Secondary | ICD-10-CM

## 2016-10-20 ENCOUNTER — Other Ambulatory Visit (HOSPITAL_COMMUNITY): Payer: Self-pay | Admitting: Cardiology

## 2016-10-22 ENCOUNTER — Other Ambulatory Visit: Payer: Self-pay | Admitting: Internal Medicine

## 2016-11-12 ENCOUNTER — Encounter: Payer: Self-pay | Admitting: Cardiology

## 2016-11-13 ENCOUNTER — Encounter (HOSPITAL_COMMUNITY): Payer: Self-pay

## 2016-11-13 ENCOUNTER — Ambulatory Visit (HOSPITAL_COMMUNITY)
Admission: RE | Admit: 2016-11-13 | Discharge: 2016-11-13 | Disposition: A | Discharge status: Home / Self Care | Payer: 59 | Source: Ambulatory Visit | Attending: Cardiology | Admitting: Cardiology

## 2016-11-13 ENCOUNTER — Other Ambulatory Visit (HOSPITAL_COMMUNITY): Payer: Self-pay | Admitting: Cardiology

## 2016-11-13 VITALS — BP 124/62 | HR 75 | Wt 185.5 lb

## 2016-11-13 DIAGNOSIS — I10 Essential (primary) hypertension: Secondary | ICD-10-CM | POA: Diagnosis not present

## 2016-11-13 DIAGNOSIS — I429 Cardiomyopathy, unspecified: Secondary | ICD-10-CM | POA: Insufficient documentation

## 2016-11-13 DIAGNOSIS — I5022 Chronic systolic (congestive) heart failure: Secondary | ICD-10-CM

## 2016-11-13 DIAGNOSIS — J449 Chronic obstructive pulmonary disease, unspecified: Secondary | ICD-10-CM | POA: Diagnosis not present

## 2016-11-13 DIAGNOSIS — E039 Hypothyroidism, unspecified: Secondary | ICD-10-CM | POA: Diagnosis not present

## 2016-11-13 DIAGNOSIS — I252 Old myocardial infarction: Secondary | ICD-10-CM | POA: Insufficient documentation

## 2016-11-13 DIAGNOSIS — Z8249 Family history of ischemic heart disease and other diseases of the circulatory system: Secondary | ICD-10-CM | POA: Insufficient documentation

## 2016-11-13 DIAGNOSIS — I447 Left bundle-branch block, unspecified: Secondary | ICD-10-CM | POA: Insufficient documentation

## 2016-11-13 DIAGNOSIS — Z6833 Body mass index (BMI) 33.0-33.9, adult: Secondary | ICD-10-CM | POA: Insufficient documentation

## 2016-11-13 DIAGNOSIS — E785 Hyperlipidemia, unspecified: Secondary | ICD-10-CM | POA: Diagnosis not present

## 2016-11-13 DIAGNOSIS — Z7982 Long term (current) use of aspirin: Secondary | ICD-10-CM | POA: Insufficient documentation

## 2016-11-13 DIAGNOSIS — G4733 Obstructive sleep apnea (adult) (pediatric): Secondary | ICD-10-CM | POA: Insufficient documentation

## 2016-11-13 DIAGNOSIS — E669 Obesity, unspecified: Secondary | ICD-10-CM | POA: Insufficient documentation

## 2016-11-13 DIAGNOSIS — Z87891 Personal history of nicotine dependence: Secondary | ICD-10-CM | POA: Diagnosis not present

## 2016-11-13 DIAGNOSIS — Z95810 Presence of automatic (implantable) cardiac defibrillator: Secondary | ICD-10-CM | POA: Insufficient documentation

## 2016-11-13 NOTE — Progress Notes (Signed)
Patient ID: Stacy Hernandez, female   DOB: [birth year], 61 y.o.   MRN: [id number] PCP: Dr. Shelia Media EP: Dr Lovena Le Cardiology: Dr Aundra Dubin   61 yo with history of HTN, intermittent LBBB, and nonischemic cardiomyopathy presents for cardiology followup.  She was admitted with chest pain in 6/13.  ECG showed intermittent LBBB.  Echo showed EF 45-50%.  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    Developed CP and dyspnea. She was set up for echo and Cardiolite in 12/15.  Echo showed EF 10-15% (significant fall).  She had a Lexiscan Cardiolite with EF 25% and apical anteroseptal and apical fixed perfusion defect.  Therefore, she was sent for left and right heart catheterization in 1/16.  This showed mild elevation in LV and RV filling pressures and no significant CAD.  She was started on Lasix 20 mg daily.  Echo in 5/16 showed EF 20-25%, mildly dilated and mildly dysfunctional RV.  PFTs 4/16 showed severe obstruction and restriction.  She has seen Dr Melvyn Novas.  She is noted to have an elevated right hemidiaphragm.   In 6/16, she had Medtronic CRT-D system placed.  In 7/16, CPX showed low normal functional capacity with the main limitation being pulmonary.  Repeat echo in 11/16 showed improvement in EF to 40-45%.    She has done quite well since last appointment.  Breathing is much better overall.  No dyspnea with walking on flat ground, mild dyspnea walking up a flight of steps.  No chest pain.  No orthopnea/PND.   Weight is up 11 lbs.   Optivol: fluid index < threshold with stable thoracic impedance, no VT or afib, 2.5 hrs/day activity.   Labs (12/13): LDL 80, HDL 46, K 4, creatinine 0.7 Labs (6/14): K 4, creatinine 0.8, LDL 73, HDL 45 Labs (12/15): K 4.2, creatinine 0.7, HCT 43.3, LDL 60, HDL 51, BNP 577, TSH normal Labs (2/16): K 4.2, creatinine 0.7 Labs (1/16): SPEP no M spike K 4.3 Creatinine 0.74  Labs (2/16): K 4.2, creatinine 0.78 Labs (02/23/2015) : K 4.2 Creatinine 0.90, BNP 339 Labs (04/13/2015): K 4.5  Creatinine 1.01  Labs (05/17/2015): K 4.7 Creatinine 0.71  Labs (11/16): K 4.1, creatinine 0.74 Labs (6/17): K 4.2, creatinine 0.85, BNP 76  PMH: 1. HTN 2. Hyperlipidemia 3. COPD: Prior smoker.  PFTs (4/16) with FVC 37%, FEV1 36%, ratio 97%, TLC 67%, DLCO 32% => severe obstruction and restriction.  COPD + elevated right hemidiaphragm.  4. Intermittent LBBB/IVCD 5. Hypothyroidism 6. Cardiomyopathy: Nonischemic.  Echo (6/13) with EF 45-50%, paradoxical septal motion.  LHC (6/13) with 40% D1 stenosis, 30% PLV stenosis.  Echo (12/15) with EF 10-15%, moderately dilated LV, diffuse hypokinesis, moderate MR.   Lexiscan Cardiolite (12/15) with EF 25%, diffuse hypokinesis, apical anteroseptal and apical fixed defect. LHC/RHC (1/16) with mean RA 8, PA 37/19, mean PCWP 19, CI 2.63, EF 30%, no significant CAD.  Cardiac MRI 12/20/2014: EF 24% Mod mitral regurg thought to be from dilated LV, normal RV size, no LGE.  Echo (5/16) with EF 20-25%, mildly dilated and mildly dysfunctional RV, mild MR. Medtronic CRT-D placed 6/16.  CPX (7/16) with peak VO2 16.3, VE/VCO2 slope 25, RER 1.1, low normal functional capacity limited primarily by lung disease.  - Echo (11/16) with EF 40-45%, mild MR.  7. 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  8. 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  Quit smoking in 1/16.  FH: Father CABG at 47   ROS: All systems reviewed and negative except as per HPI.   Current Outpatient Prescriptions  Medication Sig Dispense Refill  . aspirin 81 MG tablet Take 81 mg by mouth daily.    Marland Kitchen atorvastatin (LIPITOR) 10 MG tablet TAKE 1 TABLET BY MOUTH AT BEDTIME 30 tablet 6  . bisoprolol (ZEBETA) 10 MG tablet TAKE 1 TABLET BY MOUTH EVERY DAY 30 tablet 6  . Cholecalciferol (VITAMIN D-3 PO) Take 1,000 Units by mouth daily.     Marland Kitchen ENTRESTO 49-51 MG TAKE 1 TABLET BY MOUTH 2 (TWO) TIMES DAILY. 60 tablet 3   . furosemide (LASIX) 20 MG tablet Take 1 tablet (20 mg total) by mouth 2 (two) times daily. 60 tablet 11  . HYDROcodone-acetaminophen (NORCO) 5-325 MG per tablet Take 1 tablet by mouth at bedtime.     . hydroxychloroquine (PLAQUENIL) 200 MG tablet Take 200 mg by mouth 2 (two) times daily.  5  . Inositol Niacinate (NIACIN FLUSH FREE) 500 MG CAPS Take 3 capsules by mouth daily.    Marland Kitchen levothyroxine (SYNTHROID, LEVOTHROID) 100 MCG tablet Take 100 mcg by mouth daily before breakfast.    . Multiple Vitamins-Minerals (MULTIVITAMIN WITH MINERALS) tablet Take 1 tablet by mouth daily.    Marland Kitchen spironolactone (ALDACTONE) 25 MG tablet Take 1 tablet (25 mg total) by mouth daily. 30 tablet 3  . nitroGLYCERIN (NITROSTAT) 0.4 MG SL tablet Place 1 tablet (0.4 mg total) under the tongue every 5 (five) minutes as needed for chest pain. (Patient not taking: Reported on 11/13/2016) 25 tablet 3   No current facility-administered medications for this encounter.     BP 124/62   Pulse 75   Wt 185 lb 8 oz (84.1 kg)   SpO2 99%   BMI 33.93 kg/m  General: NAD  Neck: JVP not elevated, no thyromegaly or thyroid nodule.  Lungs: Decreased breath sounds right base. CV: Nondisplaced PMI.  Heart regular S1/S2, no S3/S4, 1/6 SEM RUSB.  Trace ankle edema.  No carotid bruit.  Normal pedal pulses.  Abdomen: Soft, nontender, no hepatosplenomegaly, mild distention.  Neurologic: Alert and oriented x 3.  Psych: Normal affect. Extremities: No clubbing or cyanosis.   Assessment/Plan: 1. Cardiomyopathy: Nonischemic cardiomyopathy. In the past, she had EF 45-50% with no significant CAD (2013).  LBBB-related nonischemic cardiomyopathy.  However, in the fall of 2015 she developed exertional chest pain and dyspnea.  Echo and Cardiolite showed marked worsening of her LV systolic function (EF 123XX123 by echo) and Cardiolite suggested possible prior MI.  Left heart cath in 1/16 showed no significant CAD.  RHC showed mildly elevated right and left  heart filling pressures and normal CI. SPEP negative and no infiltrative disease on cardiac MRI.  Repeat echo in 5/16 with EF 20-25%.  CPX showed limitations are related to lung disease as well as obesity, no HF limitation. Medtronic CRT-D placement.  Followup echo in 11/16 showed improvement in EF to 40-45%.  NYHA class II symptoms, no volume overload by exam or Optivol.  Suspect weight gain may be related to caloric intake.  - Continue Lasix 40 mg daily.     - Continue bisoprolol 10 mg daily.  - Continue Entresto 49/51 mg bid and spironolactone 25 mg daily.  Will call PCP's office for copy of recent BMET.   - Repeat echo to reassess EF. If EF is not higher, would increase Entresto to 97/103 bid.    2. Elevated right hemidiaphragm: This likely contributes significantly to  dyspnea.   3. OSA:  On CPAP.      Followup in 6 months.  Loralie Champagne 11/13/2016

## 2016-11-13 NOTE — Patient Instructions (Signed)
Your provider requests you have an Echocardiogram before your next appointment.  Follow up with Dr.McLean in 6 months.

## 2016-11-14 ENCOUNTER — Other Ambulatory Visit: Payer: Self-pay | Admitting: Internal Medicine

## 2016-11-15 ENCOUNTER — Ambulatory Visit (HOSPITAL_COMMUNITY)
Admission: RE | Admit: 2016-11-15 | Discharge: 2016-11-15 | Disposition: A | Discharge status: Home / Self Care | Payer: 59 | Source: Ambulatory Visit | Attending: Registered Nurse | Admitting: Registered Nurse

## 2016-11-15 DIAGNOSIS — I5022 Chronic systolic (congestive) heart failure: Secondary | ICD-10-CM | POA: Insufficient documentation

## 2016-11-15 DIAGNOSIS — I2510 Atherosclerotic heart disease of native coronary artery without angina pectoris: Secondary | ICD-10-CM | POA: Diagnosis not present

## 2016-11-15 DIAGNOSIS — E785 Hyperlipidemia, unspecified: Secondary | ICD-10-CM | POA: Insufficient documentation

## 2016-11-15 DIAGNOSIS — I110 Hypertensive heart disease with heart failure: Secondary | ICD-10-CM | POA: Insufficient documentation

## 2016-11-15 DIAGNOSIS — I081 Rheumatic disorders of both mitral and tricuspid valves: Secondary | ICD-10-CM | POA: Insufficient documentation

## 2016-11-15 NOTE — Progress Notes (Signed)
  Echocardiogram 2D Echocardiogram has been performed.  Stacy Hernandez 11/15/2016, 11:51 AM

## 2016-11-16 ENCOUNTER — Other Ambulatory Visit (HOSPITAL_COMMUNITY): Payer: Self-pay | Admitting: Cardiology

## 2016-11-20 ENCOUNTER — Ambulatory Visit (INDEPENDENT_AMBULATORY_CARE_PROVIDER_SITE_OTHER): Payer: 59 | Admitting: *Deleted

## 2016-11-20 DIAGNOSIS — I428 Other cardiomyopathies: Secondary | ICD-10-CM | POA: Diagnosis not present

## 2016-11-20 NOTE — Progress Notes (Signed)
Remote ICD transmission.   

## 2016-11-24 LAB — CUP PACEART REMOTE DEVICE CHECK
Battery Remaining Longevity: 87 mo
Battery Voltage: 3.01 V
Brady Statistic AP VP Percent: 16.95 %
Brady Statistic AP VS Percent: 0.07 %
Brady Statistic AS VP Percent: 81.9 %
Brady Statistic AS VS Percent: 1.09 %
Brady Statistic RA Percent Paced: 17.01 %
Brady Statistic RV Percent Paced: 98.59 %
Date Time Interrogation Session: 20180116093823
HighPow Impedance: 71 Ohm
Implantable Lead Implant Date: 20160610
Implantable Lead Implant Date: 20160610
Implantable Lead Implant Date: 20160610
Implantable Lead Location: 753858
Implantable Lead Location: 753859
Implantable Lead Location: 753860
Implantable Lead Model: 4396
Implantable Lead Model: 5076
Implantable Pulse Generator Implant Date: 20160610
Lead Channel Impedance Value: 361 Ohm
Lead Channel Impedance Value: 399 Ohm
Lead Channel Impedance Value: 456 Ohm
Lead Channel Impedance Value: 513 Ohm
Lead Channel Impedance Value: 551 Ohm
Lead Channel Impedance Value: 722 Ohm
Lead Channel Pacing Threshold Amplitude: 0.5 V
Lead Channel Pacing Threshold Amplitude: 0.625 V
Lead Channel Pacing Threshold Amplitude: 1 V
Lead Channel Pacing Threshold Pulse Width: 0.4 ms
Lead Channel Pacing Threshold Pulse Width: 0.4 ms
Lead Channel Pacing Threshold Pulse Width: 0.4 ms
Lead Channel Sensing Intrinsic Amplitude: 15.75 mV
Lead Channel Sensing Intrinsic Amplitude: 15.75 mV
Lead Channel Sensing Intrinsic Amplitude: 3.375 mV
Lead Channel Sensing Intrinsic Amplitude: 3.375 mV
Lead Channel Setting Pacing Amplitude: 1.5 V
Lead Channel Setting Pacing Amplitude: 2 V
Lead Channel Setting Pacing Amplitude: 2 V
Lead Channel Setting Pacing Pulse Width: 0.4 ms
Lead Channel Setting Pacing Pulse Width: 0.4 ms
Lead Channel Setting Sensing Sensitivity: 0.3 mV

## 2016-11-28 ENCOUNTER — Encounter: Payer: Self-pay | Admitting: Cardiology

## 2016-11-29 ENCOUNTER — Other Ambulatory Visit: Payer: Self-pay | Admitting: Internal Medicine

## 2017-01-10 ENCOUNTER — Telehealth: Payer: Self-pay

## 2017-01-10 NOTE — Telephone Encounter (Signed)
Prior auth for [employer] [id number] submitted online to [email or web address] on their PA site.

## 2017-01-21 ENCOUNTER — Telehealth (HOSPITAL_COMMUNITY): Payer: Self-pay | Admitting: Vascular Surgery

## 2017-01-21 NOTE — Telephone Encounter (Signed)
Pt wants to know if she can get Entrusto samples until she can get it approved with Insurance.. Please advise

## 2017-01-22 NOTE — Telephone Encounter (Signed)
We received another fax today from [place] requesting [place] to fill out a Prior authority request form for Entresto. We have completed this form X 2 without success.   I have fax the form to Dr Aundra Dubin and the staff our CHF clinic to address as the pt sees Dr Aundra Dubin for her CHF.

## 2017-01-25 ENCOUNTER — Telehealth (HOSPITAL_COMMUNITY): Payer: Self-pay

## 2017-01-25 NOTE — Telephone Encounter (Signed)
Entresto PA approved by [employer] through 11/04/2038  Belia Heman, PharmD PGY1 Pharmacy Resident [phone] (Pager) 01/25/2017 8:49 AM

## 2017-01-25 NOTE — Telephone Encounter (Signed)
Entresto aproved

## 2017-02-01 IMAGING — CT CT ANGIO CHEST
2 of 6 series · 19 of 36 positions shown · IV contrast (OMNIPAQUE)
Comparison: CXR 03/11/2015

CLINICAL DATA: Short of breath

EXAM:
CT ANGIOGRAPHY CHEST WITH CONTRAST
TECHNIQUE: Multidetector CT imaging of the chest was performed using the
standard protocol during bolus administration of intravenous
contrast. Multiplanar CT image reconstructions and MIPs were
obtained to evaluate the vascular anatomy.
CONTRAST:  100mL OMNIPAQUE IOHEXOL 350 MG/ML SOLN

[Series 6: pe thins @ 1mm · axial · 0.74mm/px · z∈[-306,-38]mm · 18 of 298 slices shown]
[im 15/298  lung]
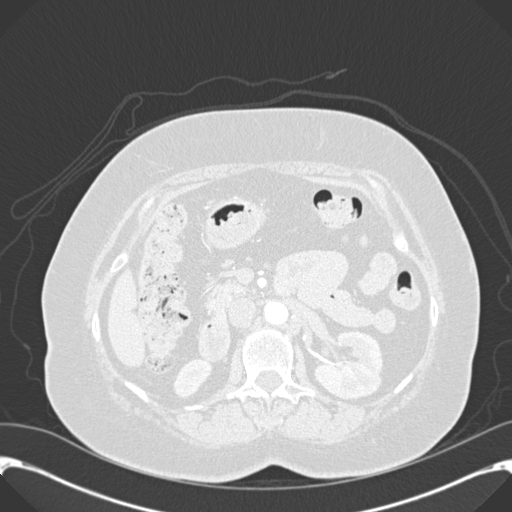
[im 30/298  mediastinal]
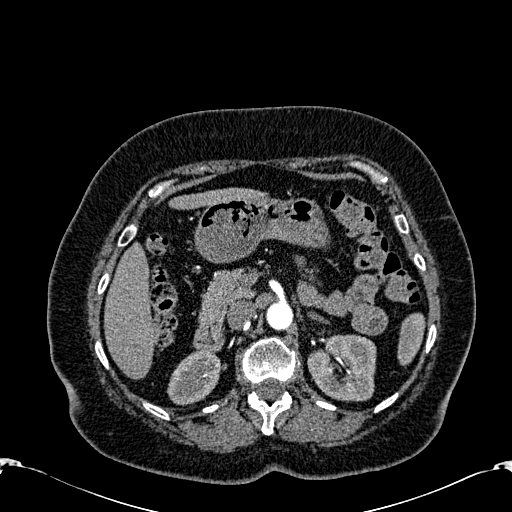
[im 45/298  lung]
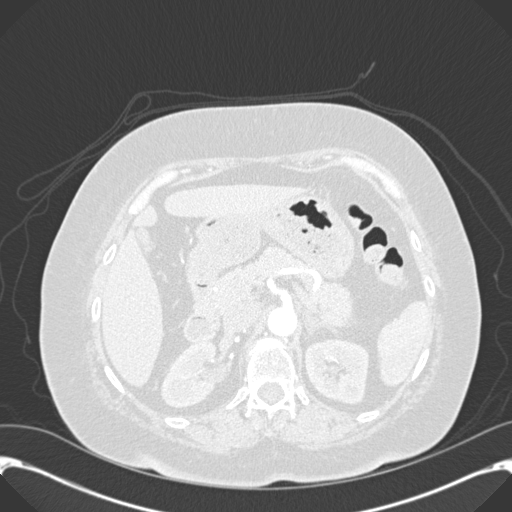
[im 60/298  mediastinal]
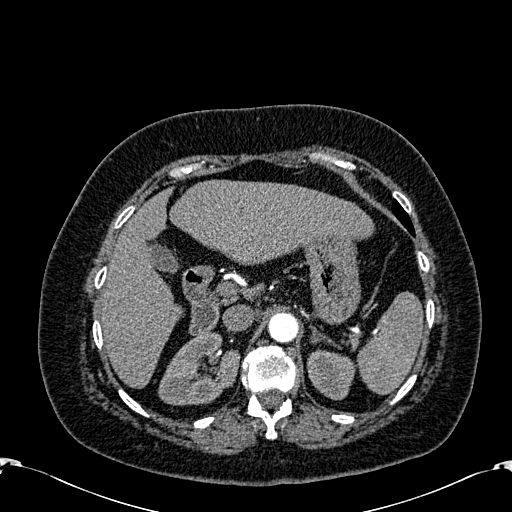
[im 75/298  lung]
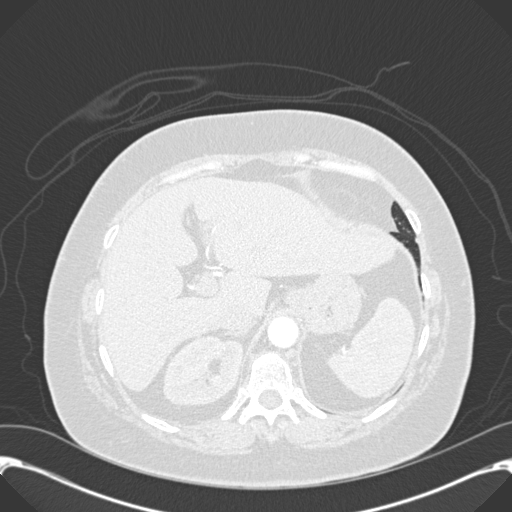
[im 90/298  mediastinal]
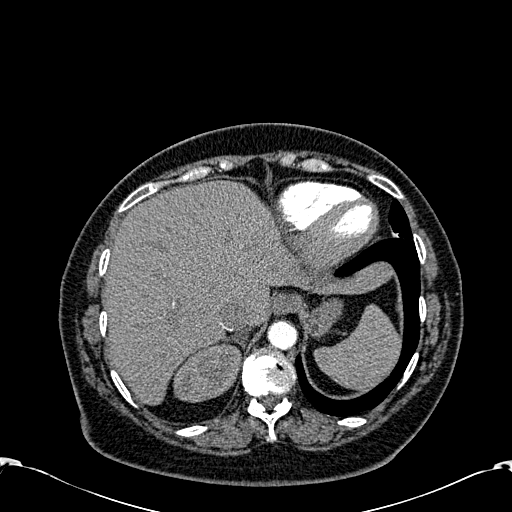
[im 104/298  lung]
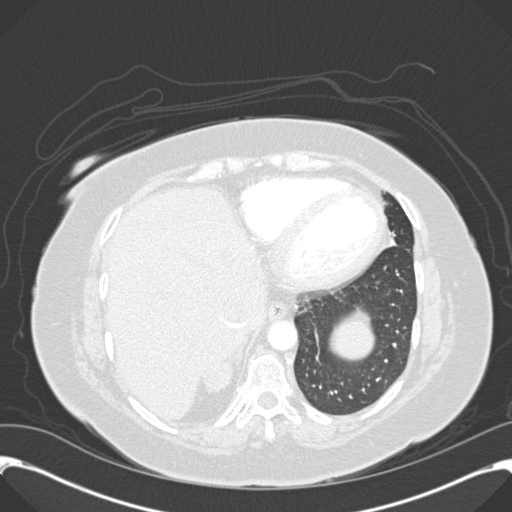
[im 119/298  mediastinal]
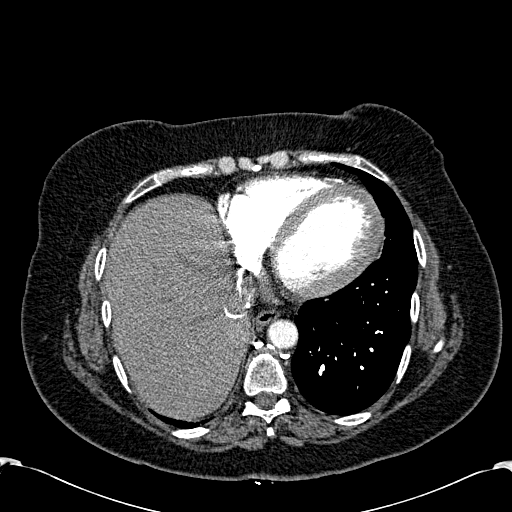
[im 134/298  lung]
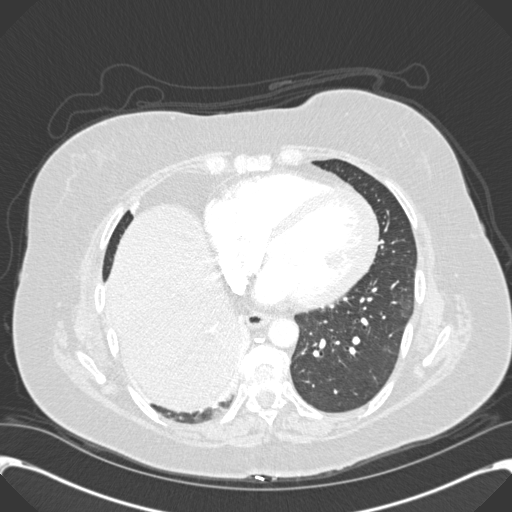
[im 164/298  mediastinal]
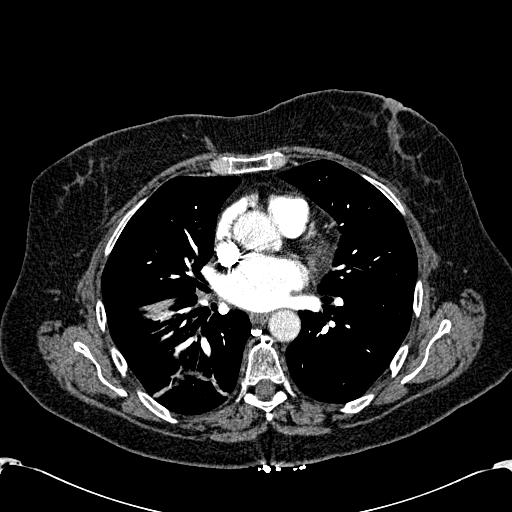
[im 179/298  lung]
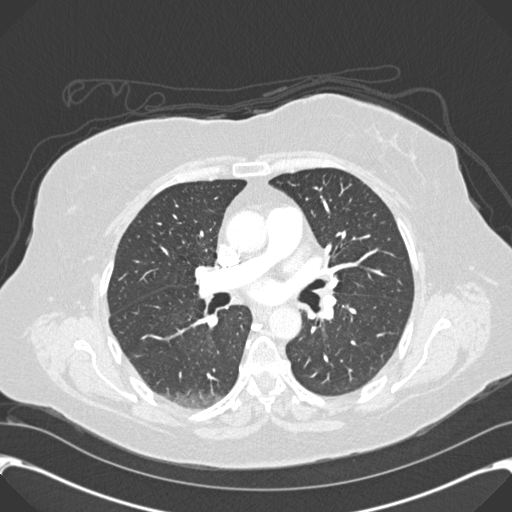
[im 194/298  mediastinal]
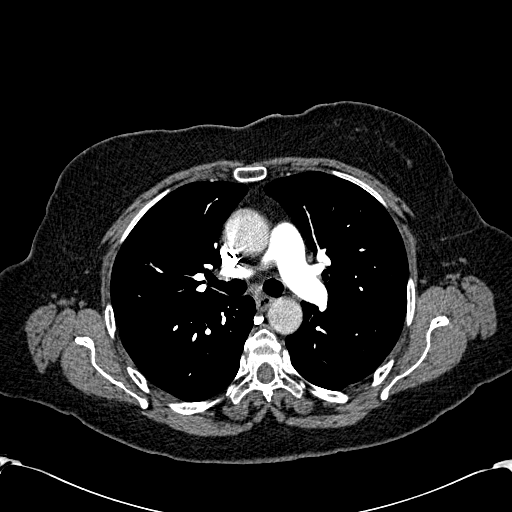
[im 208/298  lung]
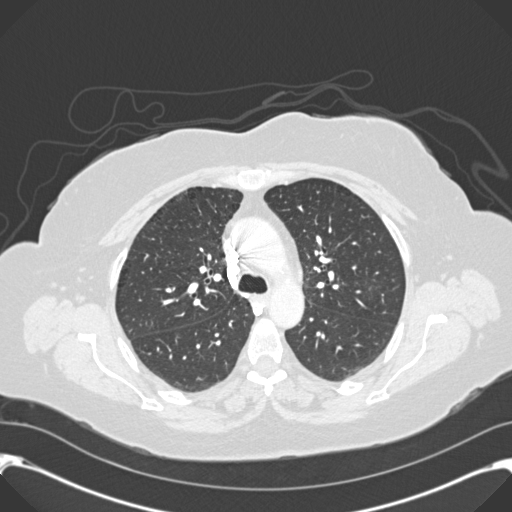
[im 223/298  mediastinal]
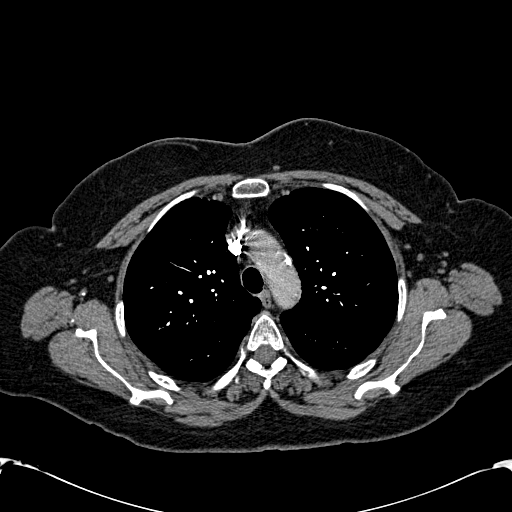
[im 238/298  lung]
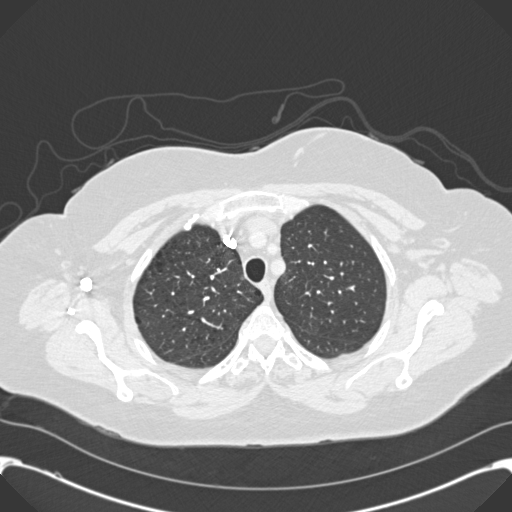
[im 253/298  mediastinal]
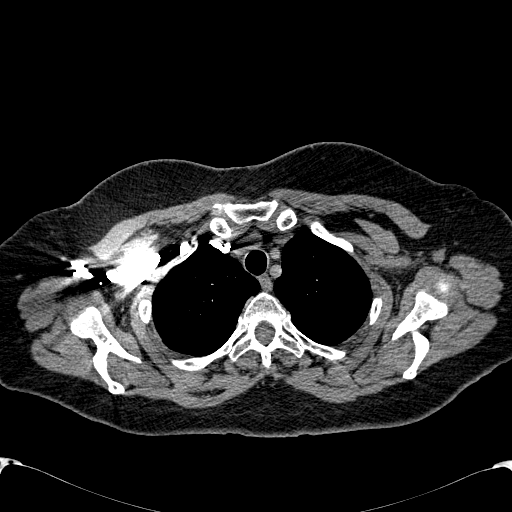
[im 268/298  lung]
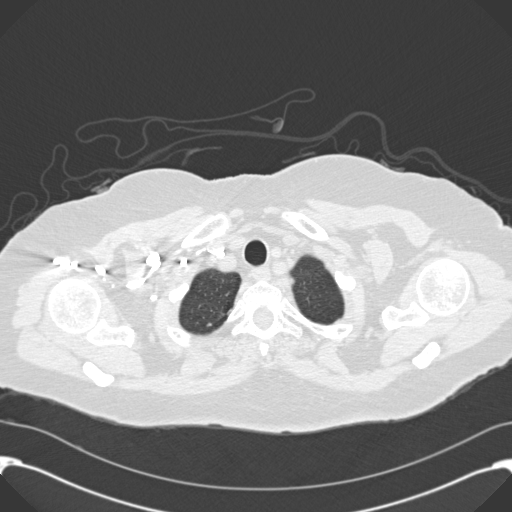
[im 283/298  mediastinal]
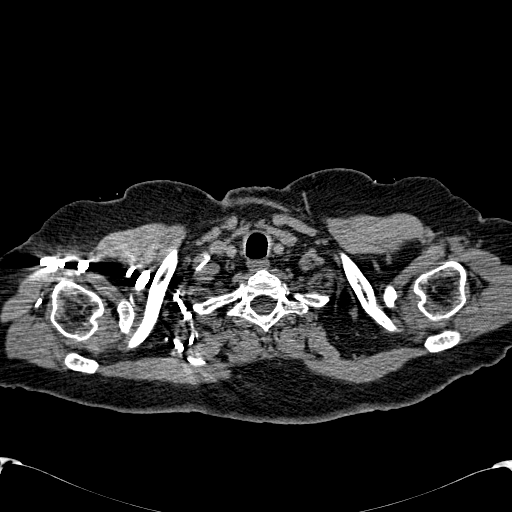

[Series 602: <mpr thick range> · coronal · 0.74mm/px · 1 of 96 slices shown]
[im 48/96  mediastinal]
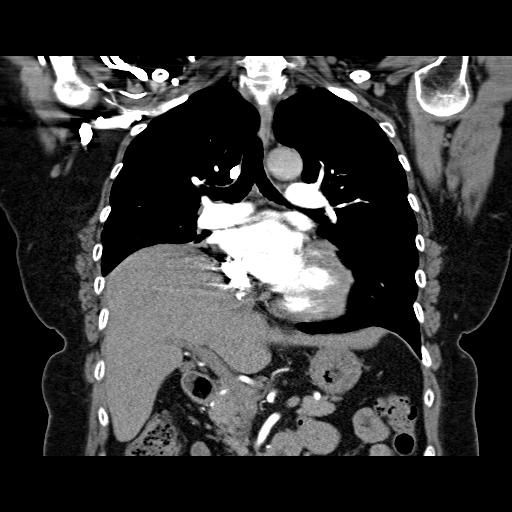

[19 of 36 positions shown; findings below may reference images not displayed]

FINDINGS: Negative for pulmonary embolism. Negative for aortic aneurysm or
dissection.

Heart size is normal. No pericardial effusion. No significant
coronary calcification. Minimal aortic arch calcification.

Moderate to marked elevation right hemidiaphragm with right lower
lobe atelectasis. This was not present on 10/19/2014. There is
dependent atelectasis in the right lower lobe without evidence of
pneumonia or effusion. Left lung is clear

Negative for mass or adenopathy.

Regional soft tissues are normal. Degenerative change in the
thoracic spine.

Review of the MIP images confirms the above findings.
IMPRESSION: Negative for pulmonary embolism.

Elevated right hemidiaphragm with right lower lobe atelectasis.
Lungs otherwise clear.

## 2017-02-02 IMAGING — RF DG SNIFF TEST
1 series · 4 of 4 positions shown · non-contrast
Comparison: Chest x-rays dated 02/09/2015 and 10/19/2014

CLINICAL DATA: New elevation of the right hemidiaphragm since
10/19/2014.

EXAM:
CHEST FLUOROSCOPY
TECHNIQUE: Real-time fluoroscopic evaluation of the chest was performed.
FLUOROSCOPY TIME:  0 min 12 seconds

[Series 1: cp_chest · 0.61mm/px · 4 of 99 frames shown]
[frame 1/99]
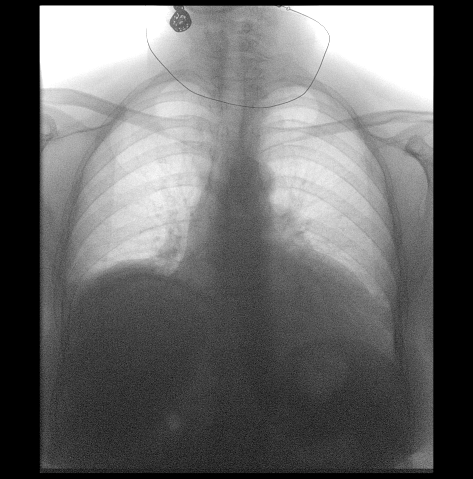
[frame 15/99]
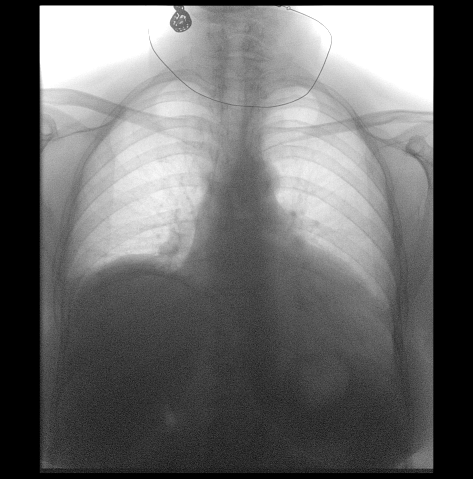
[frame 50/99]
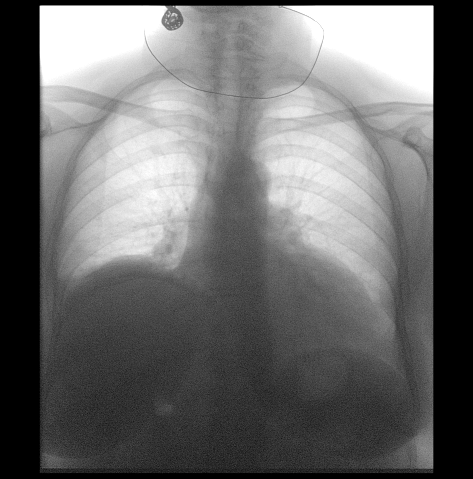
[frame 85/99]
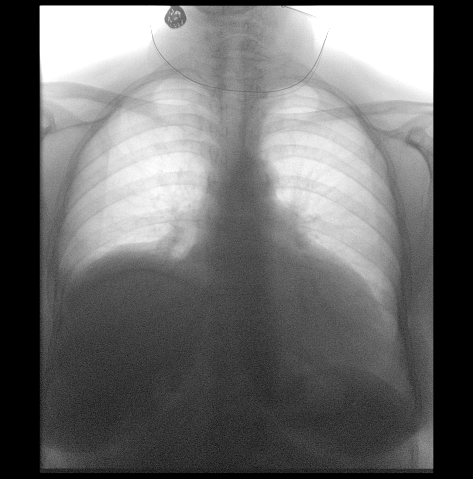

[4 of 4 positions shown; findings below may reference images not displayed]

FINDINGS: The sniff test demonstrates at the patient does have paralysis of
the right hemidiaphragm. There is linear atelectasis at the right
lung base.
IMPRESSION: Paralysis of the right hemidiaphragm.

## 2017-02-19 ENCOUNTER — Ambulatory Visit (INDEPENDENT_AMBULATORY_CARE_PROVIDER_SITE_OTHER): Payer: 59 | Admitting: *Deleted

## 2017-02-19 DIAGNOSIS — I428 Other cardiomyopathies: Secondary | ICD-10-CM

## 2017-02-19 NOTE — Progress Notes (Signed)
Remote ICD transmission.   

## 2017-02-21 LAB — CUP PACEART REMOTE DEVICE CHECK
Battery Remaining Longevity: 86 mo
Battery Voltage: 3.01 V
Brady Statistic AP VP Percent: 13.7 %
Brady Statistic AP VS Percent: 0.08 %
Brady Statistic AS VP Percent: 85.05 %
Brady Statistic AS VS Percent: 1.16 %
Brady Statistic RA Percent Paced: 13.77 %
Brady Statistic RV Percent Paced: 98.47 %
Date Time Interrogation Session: 20180417084224
HighPow Impedance: 75 Ohm
Implantable Lead Implant Date: 20160610
Implantable Lead Implant Date: 20160610
Implantable Lead Implant Date: 20160610
Implantable Lead Location: 753858
Implantable Lead Location: 753859
Implantable Lead Location: 753860
Implantable Lead Model: 4396
Implantable Lead Model: 5076
Implantable Pulse Generator Implant Date: 20160610
Lead Channel Impedance Value: 361 Ohm
Lead Channel Impedance Value: 399 Ohm
Lead Channel Impedance Value: 456 Ohm
Lead Channel Impedance Value: 551 Ohm
Lead Channel Impedance Value: 646 Ohm
Lead Channel Impedance Value: 703 Ohm
Lead Channel Pacing Threshold Amplitude: 0.5 V
Lead Channel Pacing Threshold Amplitude: 0.625 V
Lead Channel Pacing Threshold Amplitude: 1 V
Lead Channel Pacing Threshold Pulse Width: 0.4 ms
Lead Channel Pacing Threshold Pulse Width: 0.4 ms
Lead Channel Pacing Threshold Pulse Width: 0.4 ms
Lead Channel Sensing Intrinsic Amplitude: 15.125 mV
Lead Channel Sensing Intrinsic Amplitude: 15.125 mV
Lead Channel Sensing Intrinsic Amplitude: 4.5 mV
Lead Channel Sensing Intrinsic Amplitude: 4.5 mV
Lead Channel Setting Pacing Amplitude: 1.5 V
Lead Channel Setting Pacing Amplitude: 2 V
Lead Channel Setting Pacing Amplitude: 2 V
Lead Channel Setting Pacing Pulse Width: 0.4 ms
Lead Channel Setting Pacing Pulse Width: 0.4 ms
Lead Channel Setting Sensing Sensitivity: 0.3 mV

## 2017-02-22 ENCOUNTER — Encounter: Payer: Self-pay | Admitting: Cardiology

## 2017-02-23 ENCOUNTER — Other Ambulatory Visit (HOSPITAL_COMMUNITY): Payer: Self-pay | Admitting: Cardiology

## 2017-03-04 ENCOUNTER — Institutional Professional Consult (permissible substitution) (INDEPENDENT_AMBULATORY_CARE_PROVIDER_SITE_OTHER): Payer: 59 | Admitting: Pulmonary Disease

## 2017-03-25 ENCOUNTER — Other Ambulatory Visit (HOSPITAL_COMMUNITY): Payer: Self-pay | Admitting: Cardiology

## 2017-04-03 ENCOUNTER — Encounter: Payer: Self-pay | Admitting: Pulmonary Disease

## 2017-04-03 ENCOUNTER — Ambulatory Visit (INDEPENDENT_AMBULATORY_CARE_PROVIDER_SITE_OTHER): Payer: 59 | Admitting: Pulmonary Disease

## 2017-04-03 DIAGNOSIS — G4733 Obstructive sleep apnea (adult) (pediatric): Secondary | ICD-10-CM

## 2017-04-03 NOTE — Assessment & Plan Note (Signed)
patient had hypersomnia, snoring, gasping, witnessed apneas, unrefreshed  sleep. She had a lab study done on 04/18/2015 which showed her AHI was 7. She was started on CPAP therapy which she did not tolerate. She is a side sleeper and the mask would always come off. She eventually turned in the machine.  She was diagnosed with congestive heart failure systolic during the time that she was diagnosed with sleep apnea. She was started on medicines which improved her heart function. Coincidentally, her hypersomnia also improved as her heart function improved.  At present, she denies being symptomatic with regards to her sleep apnea. She sleeps well at night. Denies snoring, hypersomnia, gasping, choking, unrefreshed sleep.   Plan : 1. Patient's sleep apnea was mild in 2016. She was very  symptomatic then. Her symptoms improved as her heart failure improved. 2. Discussed with the patient diagnosis and treatment of sleep apnea. As she remains not too symptomatic and very  functional, plan to observe her sleep apnea for now. If she gets more symptomatic, I told her to give [place] a call and at that point, she will need a repeat sleep study. 3. Discussed with patient regarding good sleep hygiene.

## 2017-04-03 NOTE — Progress Notes (Signed)
Subjective:    Patient ID: Stacy Hernandez, female    DOB: 02/24/1956, 61 y.o.   MRN: [id number]  HPI   This is the case of Stacy Hernandez, 61 y.o. Female, who was referred by Dr. Deland Pretty  in consultation regarding OSA.   As you very well know, patient had hypersomnia, snoring, gasping, witnessed apneas, unrefreshed  sleep. She had a lab study done on 04/18/2015 which showed her AHI was 7. She was started on CPAP therapy which she did not tolerate. She is a side sleeper and the mask would always come off. She eventually turned in the machine.  She was diagnosed with congestive heart failure systolic during the time that she was diagnosed with sleep apnea. She was started on medicines which improved her heart function. Coincidentally, her hypersomnia also improved as her heart function improved.  At present, she denies being symptomatic with regards to her sleep apnea. She sleeps well at night. Denies snoring, hypersomnia, gasping, choking, unrefreshed sleep.     Review of Systems  Constitutional: Negative.  Negative for fever and unexpected weight change.  HENT: Positive for ear pain. Negative for congestion, dental problem, nosebleeds, postnasal drip, rhinorrhea, sinus pressure, sneezing, sore throat and trouble swallowing.   Eyes: Negative.  Negative for redness and itching.  Respiratory: Negative.  Negative for cough, chest tightness, shortness of breath and wheezing.   Cardiovascular: Negative.  Negative for palpitations and leg swelling.  Gastrointestinal: Negative.  Negative for nausea and vomiting.  Endocrine: Negative.   Genitourinary: Negative.  Negative for dysuria.  Musculoskeletal: Negative.  Negative for joint swelling.  Skin: Negative.  Negative for rash.  Allergic/Immunologic: Negative.  Negative for environmental allergies, food allergies and immunocompromised state.  Neurological: Positive for dizziness. Negative for headaches.  Hematological: Bruises/bleeds  easily.  Psychiatric/Behavioral: Negative.  Negative for dysphoric mood. The patient is not nervous/anxious.    Past Medical History:  Diagnosis Date  . AICD (automatic cardioverter/defibrillator) present 04/15/2015   Medtronic (serial Number [id number]) biventricular ICD  . Back pain    Recent positive ANA but told she has OA  . CAD (coronary artery disease)    a.  per cath in June 2013 with nonobstructive CAD;  b.  Lexiscan Myoview (12/15):  No ischemia.  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 Cardiomyopathy ([place])    a.  EF 45-50% by echo 04/23/2012;  b.  Echo (12/15):  EF 10-15%, Gr 2 DD, ventricular septum dyssynergy, mod MR, mod LAE.  Marland Kitchen COPD (chronic obstructive pulmonary disease) ([place])   . Hypercalcemia    Due to HCTZ, improved with d/c of calcium supplements  . Hypertension   . Hypertriglyceridemia   . Nephrolithiasis   . OA (osteoarthritis)   . Osteoporosis   . Sleep apnea   . Thyroid disease    Post-ablation hypothyroidism  . Tobacco abuse     (-) CA or DVT  Family History  Problem Relation Age of Onset  . Coronary artery disease Father        s/p CABG in his 80s  . Hypertension Father   . COPD Mother        smoked  . Colon polyps Mother   . Diabetes Sister   . Cirrhosis Sister   . Colon cancer Maternal Uncle 78  . Heart attack Father   . Stroke Neg Hx      Past Surgical History:  Procedure Laterality  Date  . BLADDER SURGERY     bladder & bowel tac with mesh  . CARPAL TUNNEL RELEASE    . CESAREAN SECTION    . EP IMPLANTABLE DEVICE N/A 04/15/2015   Procedure: BiV ICD Insertion CRT-D;  Surgeon: Evans Lance, MD;  Medtronic (serial Number [id number]) biventricular ICD  . LEFT AND RIGHT HEART CATHETERIZATION WITH CORONARY ANGIOGRAM N/A 11/12/2014   Procedure: LEFT AND RIGHT HEART CATHETERIZATION WITH CORONARY ANGIOGRAM;  Surgeon: Larey Dresser, MD;  Location[facility] C[facility] LAB;  Service: Cardiovascular;   Laterality: N/A;  . LEFT HEART CATHETERIZATION WITH CORONARY ANGIOGRAM N/A 04/22/2012   Procedure: LEFT HEART CATHETERIZATION WITH CORONARY ANGIOGRAM;  Surgeon: Hillary Bow, MD;  Location: [facility] CATH LAB;  Service: Cardiovascular;  Laterality: N/A;  . PARTIAL HYSTERECTOMY     Due to cervical dysplasia  . TONSILLECTOMY      Social History   Social History  . Marital status: Married    Spouse name: N/A  . Number of children: N/A  . Years of education: N/A   Occupational History  . Not on file.   Social History Main Topics  . Smoking status: Former Smoker    Packs/day: 2.00    Years: 35.00    Types: Cigarettes    Quit date: 11/05/2014  . Smokeless tobacco: Never Used  . Alcohol use No  . Drug use: No  . Sexual activity: Not Currently   Other Topics Concern  . Not on file   Social History Narrative  . No narrative on file     Allergies  Allergen Reactions  . Prednisone Nausea Only     Outpatient Medications Prior to Visit  Medication Sig Dispense Refill  . aspirin 81 MG tablet Take 81 mg by mouth daily.    Marland Kitchen atorvastatin (LIPITOR) 10 MG tablet TAKE 1 TABLET BY MOUTH AT BEDTIME 30 tablet 6  . bisoprolol (ZEBETA) 10 MG tablet TAKE 1 TABLET BY MOUTH EVERY DAY 30 tablet 6  . Cholecalciferol (VITAMIN D-3 PO) Take 1,000 Units by mouth daily.     Marland Kitchen ENTRESTO 49-51 MG TAKE 1 TABLET BY MOUTH 2 (TWO) TIMES DAILY. 60 tablet 6  . furosemide (LASIX) 20 MG tablet Take 1 tablet (20 mg total) by mouth 2 (two) times daily. 60 tablet 11  . HYDROcodone-acetaminophen (NORCO) 5-325 MG per tablet Take 1 tablet by mouth at bedtime.     . hydroxychloroquine (PLAQUENIL) 200 MG tablet Take 200 mg by mouth 2 (two) times daily.  5  . Inositol Niacinate (NIACIN FLUSH FREE) 500 MG CAPS Take 3 capsules by mouth daily.    Marland Kitchen levothyroxine (SYNTHROID, LEVOTHROID) 100 MCG tablet Take 100 mcg by mouth daily before breakfast.    . Multiple Vitamins-Minerals (MULTIVITAMIN WITH MINERALS) tablet Take 1  tablet by mouth daily.    . nitroGLYCERIN (NITROSTAT) 0.4 MG SL tablet Place 1 tablet (0.4 mg total) under the tongue every 5 (five) minutes as needed for chest pain. 25 tablet 3  . spironolactone (ALDACTONE) 25 MG tablet TAKE 1 TABLET (25 MG TOTAL) BY MOUTH DAILY. 30 tablet 3   No facility-administered medications prior to visit.    Meds ordered this encounter  Medications  . meclizine (ANTIVERT) 25 MG tablet    Sig: Take 25 mg by mouth 3 (three) times daily as needed for dizziness.  . fluticasone (FLONASE) 50 MCG/ACT nasal spray    Sig: Place into both nostrils daily.        Objective:  Physical Exam  Vitals:  Vitals:   04/03/17 1359  BP: 108/76  Pulse: (!) 59  SpO2: 96%  Weight: 189 lb 12.8 oz (86.1 kg)  Height: 5\' 2"  (1.575 m)    Constitutional/General:  Pleasant, well-nourished, well-developed, not in any distress,  Comfortably seating.  Well kempt  Body mass index is 34.71 kg/m. Wt Readings from Last 3 Encounters:  04/03/17 189 lb 12.8 oz (86.1 kg)  11/13/16 185 lb 8 oz (84.1 kg)  05/21/16 177 lb 6.4 oz (80.5 kg)    HEENT: Pupils equal and reactive to light and accommodation. Anicteric sclerae. Normal nasal mucosa.   No oral  lesions,  mouth clear,  oropharynx clear, no postnasal drip. (-) Oral thrush. No dental caries.  Airway - Mallampati class III  Neck: No masses. Midline trachea. No JVD, (-) LAD. (-) bruits appreciated.  Respiratory/Chest: Grossly normal chest. (-) deformity. (-) Accessory muscle use.  Symmetric expansion. (-) Tenderness on palpation.  Resonant on percussion.  Diminished BS on both lower lung zones. (-) wheezing, crackles, rhonchi (-) egophony  Cardiovascular: Regular rate and  rhythm, heart sounds normal, no murmur or gallops, no peripheral edema  Gastrointestinal:  Normal bowel sounds. Soft, non-tender. No hepatosplenomegaly.  (-) masses.   Musculoskeletal:  Normal muscle tone. Normal gait.   Extremities: Grossly normal. (-)  clubbing, cyanosis.  (-) edema  Skin: (-) rash,lesions seen.   Neurological/Psychiatric : alert, oriented to time, place, person. Normal mood and affect         Assessment & Plan:  OSA (obstructive sleep apnea) patient had hypersomnia, snoring, gasping, witnessed apneas, unrefreshed  sleep. She had a lab study done on 04/18/2015 which showed her AHI was 7. She was started on CPAP therapy which she did not tolerate. She is a side sleeper and the mask would always come off. She eventually turned in the machine.  She was diagnosed with congestive heart failure systolic during the time that she was diagnosed with sleep apnea. She was started on medicines which improved her heart function. Coincidentally, her hypersomnia also improved as her heart function improved.  At present, she denies being symptomatic with regards to her sleep apnea. She sleeps well at night. Denies snoring, hypersomnia, gasping, choking, unrefreshed sleep.   Plan : 1. Patient's sleep apnea was mild in 2016. She was very  symptomatic then. Her symptoms improved as her heart failure improved. 2. Discussed with the patient diagnosis and treatment of sleep apnea. As she remains not too symptomatic and very  functional, plan to observe her sleep apnea for now. If she gets more symptomatic, I told her to give [place] a call and at that point, she will need a repeat sleep study. 3. Discussed with patient regarding good sleep hygiene.     Thank you very much for letting me participate in this patient's care. Please do not hesitate to give me a call if you have any questions or concerns regarding the treatment plan.    Patient will follow up with [place] as needed.     Monica Becton, MD 04/03/2017   2:40 PM Pulmonary and [place] Pager: [phone] Office: [phone], Fax: [phone]

## 2017-04-03 NOTE — Patient Instructions (Signed)
  It was a pleasure taking care of you today!  Let us know if you get symptomatic again with sleepiness.   Return to clinic as needed.

## 2017-04-04 ENCOUNTER — Telehealth: Payer: Self-pay | Admitting: Internal Medicine

## 2017-04-04 ENCOUNTER — Other Ambulatory Visit: Payer: Self-pay | Admitting: Internal Medicine

## 2017-04-04 NOTE — Telephone Encounter (Signed)
Patient calling, has a question about the two appointments on 05-21-17. Thanks.

## 2017-04-04 NOTE — Telephone Encounter (Signed)
Called pt and informed her that the remote check would be cancelled since she is seeing Dr. Lovena Le on the same day.

## 2017-05-06 ENCOUNTER — Telehealth (HOSPITAL_COMMUNITY): Payer: Self-pay | Admitting: Pharmacist

## 2017-05-06 NOTE — Telephone Encounter (Signed)
Entresto 49-51 mg BID PA approved by [employer] through 11/04/38.   Ruta Hinds. Velva Harman, PharmD, BCPS, CPP Clinical Pharmacist Pager: [phone] Phone: [phone] 05/06/2017 1:11 PM

## 2017-05-10 ENCOUNTER — Encounter: Payer: Self-pay | Admitting: Cardiology

## 2017-05-10 ENCOUNTER — Ambulatory Visit (HOSPITAL_COMMUNITY)
Admission: RE | Admit: 2017-05-10 | Discharge: 2017-05-10 | Disposition: A | Discharge status: Home / Self Care | Payer: BLUE CROSS/BLUE SHIELD | Source: Ambulatory Visit | Attending: Cardiology | Admitting: Cardiology

## 2017-05-10 ENCOUNTER — Encounter (HOSPITAL_COMMUNITY): Payer: Self-pay | Admitting: Cardiology

## 2017-05-10 VITALS — BP 130/90 | HR 68 | Wt 188.8 lb

## 2017-05-10 DIAGNOSIS — I447 Left bundle-branch block, unspecified: Secondary | ICD-10-CM | POA: Diagnosis not present

## 2017-05-10 DIAGNOSIS — E669 Obesity, unspecified: Secondary | ICD-10-CM | POA: Diagnosis not present

## 2017-05-10 DIAGNOSIS — E785 Hyperlipidemia, unspecified: Secondary | ICD-10-CM | POA: Diagnosis not present

## 2017-05-10 DIAGNOSIS — G4733 Obstructive sleep apnea (adult) (pediatric): Secondary | ICD-10-CM | POA: Diagnosis not present

## 2017-05-10 DIAGNOSIS — I5022 Chronic systolic (congestive) heart failure: Secondary | ICD-10-CM | POA: Diagnosis not present

## 2017-05-10 DIAGNOSIS — I252 Old myocardial infarction: Secondary | ICD-10-CM | POA: Insufficient documentation

## 2017-05-10 DIAGNOSIS — J449 Chronic obstructive pulmonary disease, unspecified: Secondary | ICD-10-CM | POA: Insufficient documentation

## 2017-05-10 DIAGNOSIS — I429 Cardiomyopathy, unspecified: Secondary | ICD-10-CM | POA: Diagnosis not present

## 2017-05-10 DIAGNOSIS — I10 Essential (primary) hypertension: Secondary | ICD-10-CM | POA: Insufficient documentation

## 2017-05-10 DIAGNOSIS — E039 Hypothyroidism, unspecified: Secondary | ICD-10-CM | POA: Insufficient documentation

## 2017-05-10 DIAGNOSIS — Z6834 Body mass index (BMI) 34.0-34.9, adult: Secondary | ICD-10-CM | POA: Insufficient documentation

## 2017-05-10 DIAGNOSIS — Z8249 Family history of ischemic heart disease and other diseases of the circulatory system: Secondary | ICD-10-CM | POA: Insufficient documentation

## 2017-05-10 DIAGNOSIS — Z7982 Long term (current) use of aspirin: Secondary | ICD-10-CM | POA: Diagnosis not present

## 2017-05-10 DIAGNOSIS — Z87891 Personal history of nicotine dependence: Secondary | ICD-10-CM | POA: Insufficient documentation

## 2017-05-10 LAB — BASIC METABOLIC PANEL
Anion gap: 7 (ref 5–15)
BUN: 15 mg/dL (ref 6–20)
CO2: 26 mmol/L (ref 22–32)
Calcium: 9.8 mg/dL (ref 8.9–10.3)
Chloride: 106 mmol/L (ref 101–111)
Creatinine, Ser: 0.91 mg/dL (ref 0.44–1.00)
GFR calc Af Amer: >60 mL/min (ref >60)
GFR calc non Af Amer: >60 mL/min (ref >60)
Glucose, Bld: 102 mg/dL — ABNORMAL HIGH (ref 65–99)
Potassium: 3.8 mmol/L (ref 3.5–5.1)
Sodium: 139 mmol/L (ref 135–145)

## 2017-05-10 MED ORDER — SACUBITRIL-VALSARTAN 97-103 MG PO TABS: 1.0000 | ORAL_TABLET | Freq: Two times a day (BID) | ORAL | 6 refills | Status: DC

## 2017-05-10 NOTE — Patient Instructions (Addendum)
INCREASE Entresto to 97/103 mg twice daily. May double up on your current 49/51 mg tablets (Take 2 tabs twice daily). Call Ileene Patrick CHF clinical pharmacist for any issues obtaining new Rx or financial needs.  Routine lab work today. Will notify you of abnormal results, otherwise no news is good news!  Return in 2 weeks to repeat labs.  Follow up with Dr. Aundra Dubin in 4 months. Take all medication as prescribed the day of your appointment. Bring all medications with you to your appointment.  Do the following things EVERYDAY: 1) Weigh yourself in the morning before breakfast. Write it down and keep it in a log. 2) Take your medicines as prescribed 3) Eat low salt foods-Limit salt (sodium) to 2000 mg per day.  4) Stay as active as you can everyday 5) Limit all fluids for the day to less than 2 liters

## 2017-05-10 NOTE — Progress Notes (Signed)
Patient ID: DARYA BIGLER, female   DOB: 1956-09-14, 61 y.o.   MRN: [id number] PCP: Dr. Shelia Media EP: Dr Lovena Le Cardiology: Dr Aundra Dubin   61 yo with history of HTN, intermittent LBBB, and nonischemic cardiomyopathy presents for cardiology followup.  She was admitted with chest pain in 6/13.  ECG showed intermittent LBBB.  Echo showed EF 45-50%.  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    Developed CP and dyspnea. She was set up for echo and Cardiolite in 12/15.  Echo showed EF 10-15% (significant fall).  She had a Lexiscan Cardiolite with EF 25% and apical anteroseptal and apical fixed perfusion defect.  Therefore, she was sent for left and right heart catheterization in 1/16.  This showed mild elevation in LV and RV filling pressures and no significant CAD.  She was started on Lasix 20 mg daily.  Echo in 5/16 showed EF 20-25%, mildly dilated and mildly dysfunctional RV.  PFTs 4/16 showed severe obstruction and restriction.  She has seen Dr Melvyn Novas.  She is noted to have an elevated right hemidiaphragm.   In 6/16, she had Medtronic CRT-D system placed.  In 7/16, CPX showed low normal functional capacity with the main limitation being pulmonary.  Repeat echo in 11/16 showed improvement in EF to 40-45%.    Echo was done again today and I reviewed it.  EF remains 40-45% with diffuse hypokinesis.  She is generally doing ok.  Able to walk on flat ground without dyspnea.  Can get around [employer].  Dyspnea with fast walking and inclines.  No orthopnea/PND.  No palpitations. No chest pain. She is not using CPAP as she has not found a mask that she can tolerate.  Optivol reviewed today: fluid index < threshold with stable thoracic impedance, no VT or afib  Labs (12/13): LDL 80, HDL 46, K 4, creatinine 0.7 Labs (6/14): K 4, creatinine 0.8, LDL 73, HDL 45 Labs (12/15): K 4.2, creatinine 0.7, HCT 43.3, LDL 60, HDL 51, BNP 577, TSH normal Labs (2/16): K 4.2, creatinine 0.7 Labs (1/16): SPEP no M spike K 4.3  Creatinine 0.74  Labs (2/16): K 4.2, creatinine 0.78 Labs (02/23/2015) : K 4.2 Creatinine 0.90, BNP 339 Labs (04/13/2015): K 4.5 Creatinine 1.01  Labs (05/17/2015): K 4.7 Creatinine 0.71  Labs (11/16): K 4.1, creatinine 0.74 Labs (6/17): K 4.2, creatinine 0.85, BNP 76 Labs (1/18): LDL 55, HDL 42  PMH: 1. HTN 2. Hyperlipidemia 3. COPD: Prior smoker.  PFTs (4/16) with FVC 37%, FEV1 36%, ratio 97%, TLC 67%, DLCO 32% => severe obstruction and restriction.  COPD + elevated right hemidiaphragm.  4. Intermittent LBBB/IVCD 5. Hypothyroidism 6. Cardiomyopathy: Nonischemic.  Echo (6/13) with EF 45-50%, paradoxical septal motion.  LHC (6/13) with 40% D1 stenosis, 30% PLV stenosis.  Echo (12/15) with EF 10-15%, moderately dilated LV, diffuse hypokinesis, moderate MR.   Lexiscan Cardiolite (12/15) with EF 25%, diffuse hypokinesis, apical anteroseptal and apical fixed defect. LHC/RHC (1/16) with mean RA 8, PA 37/19, mean PCWP 19, CI 2.63, EF 30%, no significant CAD.  Cardiac MRI 12/20/2014: EF 24% Mod mitral regurg thought to be from dilated LV, normal RV size, no LGE.  Echo (5/16) with EF 20-25%, mildly dilated and mildly dysfunctional RV, mild MR. Medtronic CRT-D placed 6/16.  CPX (7/16) with peak VO2 16.3, VE/VCO2 slope 25, RER 1.1, low normal functional capacity limited primarily by lung disease.  - Echo (11/16) with EF 40-45%, mild MR.  - Echo (7/18): EF 40-45%, diffuse hypokinesis, grade II diastolic dysfunction.  7. 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  8. OSA: 04/18/2015 Sleep Study. She has not been able to tolerate CPAP.  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  Quit smoking in 1/16.    FH: Father CABG at 17   ROS: All systems reviewed and negative except as per HPI.   Current Outpatient Prescriptions  Medication Sig Dispense Refill  . aspirin 81 MG tablet Take 81 mg by mouth daily.    Marland Kitchen atorvastatin (LIPITOR) 10 MG tablet TAKE 1  TABLET BY MOUTH AT BEDTIME 30 tablet 6  . bisoprolol (ZEBETA) 10 MG tablet TAKE 1 TABLET BY MOUTH EVERY DAY 30 tablet 6  . Cholecalciferol (VITAMIN D-3 PO) Take 1,000 Units by mouth daily.     . fluticasone (FLONASE) 50 MCG/ACT nasal spray Place into both nostrils daily.    . furosemide (LASIX) 20 MG tablet TAKE 1 TABLET (20 MG TOTAL) BY MOUTH 2 (TWO) TIMES DAILY. 60 tablet 11  . HYDROcodone-acetaminophen (NORCO) 5-325 MG per tablet Take 1 tablet by mouth at bedtime.     . hydroxychloroquine (PLAQUENIL) 200 MG tablet Take 200 mg by mouth 2 (two) times daily.  5  . Inositol Niacinate (NIACIN FLUSH FREE) 500 MG CAPS Take 3 capsules by mouth daily.    Marland Kitchen levothyroxine (SYNTHROID, LEVOTHROID) 100 MCG tablet Take 100 mcg by mouth daily before breakfast.    . Multiple Vitamins-Minerals (MULTIVITAMIN WITH MINERALS) tablet Take 1 tablet by mouth daily.    Marland Kitchen spironolactone (ALDACTONE) 25 MG tablet TAKE 1 TABLET (25 MG TOTAL) BY MOUTH DAILY. 30 tablet 3  . nitroGLYCERIN (NITROSTAT) 0.4 MG SL tablet Place 1 tablet (0.4 mg total) under the tongue every 5 (five) minutes as needed for chest pain. (Patient not taking: Reported on 05/10/2017) 25 tablet 3  . sacubitril-valsartan (ENTRESTO) 97-103 MG Take 1 tablet by mouth 2 (two) times daily. 60 tablet 6   No current facility-administered medications for this encounter.     BP 130/90 (BP Location: Left Arm, Patient Position: Sitting, Cuff Size: Normal)   Pulse 68   Wt 188 lb 12.8 oz (85.6 kg)   SpO2 96%   BMI 34.53 kg/m  General: NAD  Neck: JVP not elevated, no thyromegaly or thyroid nodule.  Lungs: Decreased breath sounds bilaterally (mildly). CV: Nondisplaced PMI.  Heart regular S1/S2, no S3/S4, 1/6 SEM RUSB. No edema.  No carotid bruit.  Normal pedal pulses.  Abdomen: Soft, nontender, no hepatosplenomegaly, mild distention.  Neurologic: Alert and oriented x 3.  Psych: Normal affect. Extremities: No clubbing or cyanosis.   Assessment/Plan: 1.  Cardiomyopathy: Nonischemic cardiomyopathy. In the past, she had EF 45-50% with no significant CAD (2013).  LBBB-related nonischemic cardiomyopathy.  However, in the fall of 2015 she developed exertional chest pain and dyspnea.  Echo and Cardiolite showed marked worsening of her LV systolic function (EF 50-38% by echo) and Cardiolite suggested possible prior MI.  Left heart cath in 1/16 showed no significant CAD.  RHC showed mildly elevated right and left heart filling pressures and normal CI. SPEP negative and no infiltrative disease on cardiac MRI. Repeat echo in 5/16 with EF 20-25%.  CPX showed limitations are related to lung disease as well as obesity, no HF limitation. Medtronic CRT-D placement.  Followup echo in 11/16 showed improvement in EF to 40-45%.  Echo done today and reviewed, EF 40-45% still.  She is not volume overloaded today by exam or by Optivol.  NYHA  class II symptoms.  - Continue Lasix 40 mg daily.     - Continue bisoprolol 10 mg daily.  - Increase Entresto to 97/103 bid and check BMET today and again in 2 wks.   - Continue spironolactone.     2. Elevated right hemidiaphragm: This likely contributes to dyspnea.   3. OSA:  Has not been able to tolerate CPAP.    4. COPD: Severe by PFTs.   Loralie Champagne 05/10/2017

## 2017-05-21 ENCOUNTER — Encounter: Payer: 59 | Admitting: *Deleted

## 2017-05-21 ENCOUNTER — Encounter: Payer: Self-pay | Admitting: Internal Medicine

## 2017-05-21 ENCOUNTER — Ambulatory Visit (INDEPENDENT_AMBULATORY_CARE_PROVIDER_SITE_OTHER): Payer: BLUE CROSS/BLUE SHIELD | Admitting: Internal Medicine

## 2017-05-21 VITALS — BP 102/70 | HR 65 | Ht 62.0 in | Wt 191.8 lb

## 2017-05-21 DIAGNOSIS — I428 Other cardiomyopathies: Secondary | ICD-10-CM | POA: Diagnosis not present

## 2017-05-21 DIAGNOSIS — I5022 Chronic systolic (congestive) heart failure: Secondary | ICD-10-CM | POA: Diagnosis not present

## 2017-05-21 NOTE — Patient Instructions (Signed)
Medication Instructions:  Your physician recommends that you continue on your current medications as directed. Please refer to the Current Medication list given to you today.   Labwork: None ordered.   Testing/Procedures: None ordered.   Follow-Up: Your physician wants you to follow-up in: one year with Dr. Lovena Le. You will receive a reminder letter in the mail two months in advance. If you don't receive a letter, please call our office to schedule the follow-up appointment.  Remote monitoring is used to monitor your ICD from home. This monitoring reduces the number of office visits required to check your device to one time per year. It allows [place] to keep an eye on the functioning of your device to ensure it is working properly. You are scheduled for a device check from home on 08/20/2017. You may send your transmission at any time that day. If you have a wireless device, the transmission will be sent automatically. After your physician reviews your transmission, you will receive a postcard with your next transmission date.     Any Other Special Instructions Will Be Listed Below (If Applicable).     If you need a refill on your cardiac medications before your next appointment, please call your pharmacy.

## 2017-05-21 NOTE — Progress Notes (Signed)
HPI Stacy Hernandez returns today for ongoing evaluation and management of her ICD. She is a 61 yo woman with multiple medical problems including chronic systolic heart failure, HTN, COPD with a h/o tobacco abuse, and obesity. She underwent CPX testing last year and was found to have a pulmonary restriction to exertion. She thinks she has had a modest improvement in her dyspnea since her BiV. She has stopped smoking and gained 50 lbs.  Allergies  Allergen Reactions  . Prednisone Nausea Only     Current Outpatient Prescriptions  Medication Sig Dispense Refill  . aspirin 81 MG tablet Take 81 mg by mouth daily.    Marland Kitchen atorvastatin (LIPITOR) 10 MG tablet TAKE 1 TABLET BY MOUTH AT BEDTIME 30 tablet 6  . bisoprolol (ZEBETA) 10 MG tablet TAKE 1 TABLET BY MOUTH EVERY DAY 30 tablet 6  . Cholecalciferol (VITAMIN D-3 PO) Take 1,000 Units by mouth daily.     . fluticasone (FLONASE) 50 MCG/ACT nasal spray Place into both nostrils daily.    . furosemide (LASIX) 20 MG tablet TAKE 1 TABLET (20 MG TOTAL) BY MOUTH 2 (TWO) TIMES DAILY. 60 tablet 11  . HYDROcodone-acetaminophen (NORCO) 5-325 MG per tablet Take 1 tablet by mouth at bedtime.     . hydroxychloroquine (PLAQUENIL) 200 MG tablet Take 200 mg by mouth 2 (two) times daily.  5  . Inositol Niacinate (NIACIN FLUSH FREE) 500 MG CAPS Take 3 capsules by mouth daily.    Marland Kitchen levothyroxine (SYNTHROID, LEVOTHROID) 100 MCG tablet Take 100 mcg by mouth daily before breakfast.    . Multiple Vitamins-Minerals (MULTIVITAMIN WITH MINERALS) tablet Take 1 tablet by mouth daily.    . nitroGLYCERIN (NITROSTAT) 0.4 MG SL tablet Place 1 tablet (0.4 mg total) under the tongue every 5 (five) minutes as needed for chest pain. 25 tablet 3  . sacubitril-valsartan (ENTRESTO) 97-103 MG Take 1 tablet by mouth 2 (two) times daily. 60 tablet 6  . spironolactone (ALDACTONE) 25 MG tablet TAKE 1 TABLET (25 MG TOTAL) BY MOUTH DAILY. 30 tablet 3   No current facility-administered  medications for this visit.      Past Medical History:  Diagnosis Date  . AICD (automatic cardioverter/defibrillator) present 04/15/2015   Medtronic (serial Number [id number]) biventricular ICD  . Back pain    Recent positive ANA but told she has OA  . CAD (coronary artery disease)    a.  per cath in June 2013 with nonobstructive CAD;  b.  Lexiscan Myoview (12/15):  No ischemia.  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 Cardiomyopathy ([place])    a.  EF 45-50% by echo 04/23/2012;  b.  Echo (12/15):  EF 10-15%, Gr 2 DD, ventricular septum dyssynergy, mod MR, mod LAE.  Marland Kitchen COPD (chronic obstructive pulmonary disease) ([place])   . Hypercalcemia    Due to HCTZ, improved with d/c of calcium supplements  . Hypertension   . Hypertriglyceridemia   . Nephrolithiasis   . OA (osteoarthritis)   . Osteoporosis   . Sleep apnea   . Thyroid disease    Post-ablation hypothyroidism  . Tobacco abuse     ROS:   All systems reviewed and negative except as noted in the HPI.   Past Surgical History:  Procedure Laterality Date  . BLADDER SURGERY     bladder & bowel tac with mesh  . CARPAL TUNNEL RELEASE    . CESAREAN SECTION    .  EP IMPLANTABLE DEVICE N/A 04/15/2015   Procedure: BiV ICD Insertion CRT-D;  Surgeon: Evans Lance, MD;  Medtronic (serial Number [id number]) biventricular ICD  . LEFT AND RIGHT HEART CATHETERIZATION WITH CORONARY ANGIOGRAM N/A 11/12/2014   Procedure: LEFT AND RIGHT HEART CATHETERIZATION WITH CORONARY ANGIOGRAM;  Surgeon: Larey Dresser, MD;  Location: [facility] CATH LAB;  Service: Cardiovascular;  Laterality: N/A;  . LEFT HEART CATHETERIZATION WITH CORONARY ANGIOGRAM N/A 04/22/2012   Procedure: LEFT HEART CATHETERIZATION WITH CORONARY ANGIOGRAM;  Surgeon: Hillary Bow, MD;  Location: [facility] CATH LAB;  Service: Cardiovascular;  Laterality: N/A;  . PARTIAL HYSTERECTOMY     Due to cervical dysplasia  . TONSILLECTOMY        Family History  Problem Relation Age of Onset  . Coronary artery disease Father        s/p CABG in his 59s  . Hypertension Father   . COPD Mother        smoked  . Colon polyps Mother   . Diabetes Sister   . Cirrhosis Sister   . Colon cancer Maternal Uncle 78  . Heart attack Father   . Stroke Neg Hx      Social History   Social History  . Marital status: Married    Spouse name: N/A  . Number of children: N/A  . Years of education: N/A   Occupational History  . Not on file.   Social History Main Topics  . Smoking status: Former Smoker    Packs/day: 2.00    Years: 35.00    Types: Cigarettes    Quit date: 11/05/2014  . Smokeless tobacco: Never Used  . Alcohol use No  . Drug use: No  . Sexual activity: Not Currently   Other Topics Concern  . Not on file   Social History Narrative  . No narrative on file     BP 102/70   Pulse 65   Ht 5\' 2"  (1.575 m)   Wt 191 lb 12.8 oz (87 kg)   SpO2 96%   BMI 35.08 kg/m   Physical Exam:  Well appearing 61 yo woman, NAD HEENT: Unremarkable Neck:  6 cm JVD, no thyromegally Lymphatics:  No adenopathy Back:  No CVA tenderness Lungs:  Clear but decreased breath sounds. HEART:  Regular rate rhythm, no murmurs, no rubs, no clicks Abd:  soft, positive bowel sounds, no organomegally, no rebound, no guarding Ext:  2 plus pulses, no edema, no cyanosis, no clubbing Skin:  No rashes no nodules Neuro:  CN II through XII intact, motor grossly intact  Device followup - normal device function.  ECG - NSR with biv pacing  Assess/Plan: 1. Chronic systolic heart failure - her symptoms are class 2. She will continue her current meds.  2. Obesity - she has gained 13 lbs. I have asked her to lose weight. 3. ICD - her medtronic BiV ICD is working normally. Will recheck in several months. 4. Tobacco abuse - she is in remission. Will follow.  Stacy Hernandez.D.

## 2017-05-24 ENCOUNTER — Ambulatory Visit (HOSPITAL_COMMUNITY)
Admission: RE | Admit: 2017-05-24 | Discharge: 2017-05-24 | Disposition: A | Discharge status: Home / Self Care | Payer: BLUE CROSS/BLUE SHIELD | Source: Ambulatory Visit | Attending: Cardiology | Admitting: Cardiology

## 2017-05-24 DIAGNOSIS — I5022 Chronic systolic (congestive) heart failure: Secondary | ICD-10-CM | POA: Diagnosis present

## 2017-05-24 LAB — BASIC METABOLIC PANEL
Anion gap: 8 (ref 5–15)
BUN: 11 mg/dL (ref 6–20)
CO2: 26 mmol/L (ref 22–32)
Calcium: 9.4 mg/dL (ref 8.9–10.3)
Chloride: 107 mmol/L (ref 101–111)
Creatinine, Ser: 0.91 mg/dL (ref 0.44–1.00)
GFR calc Af Amer: >60 mL/min (ref >60)
GFR calc non Af Amer: >60 mL/min (ref >60)
Glucose, Bld: 99 mg/dL (ref 65–99)
Potassium: 3.9 mmol/L (ref 3.5–5.1)
Sodium: 141 mmol/L (ref 135–145)

## 2017-05-31 ENCOUNTER — Other Ambulatory Visit: Payer: Self-pay | Admitting: Cardiology

## 2017-05-31 ENCOUNTER — Other Ambulatory Visit: Payer: Self-pay | Admitting: Internal Medicine

## 2017-06-28 ENCOUNTER — Other Ambulatory Visit: Payer: Self-pay | Admitting: Internal Medicine

## 2017-06-28 ENCOUNTER — Other Ambulatory Visit (HOSPITAL_COMMUNITY): Payer: Self-pay | Admitting: Internal Medicine

## 2017-06-28 ENCOUNTER — Other Ambulatory Visit: Payer: Self-pay | Admitting: Endocrinology

## 2017-06-28 DIAGNOSIS — Z1231 Encounter for screening mammogram for malignant neoplasm of breast: Secondary | ICD-10-CM

## 2017-06-28 DIAGNOSIS — M859 Disorder of bone density and structure, unspecified: Secondary | ICD-10-CM

## 2017-08-20 ENCOUNTER — Ambulatory Visit (INDEPENDENT_AMBULATORY_CARE_PROVIDER_SITE_OTHER): Payer: BLUE CROSS/BLUE SHIELD | Admitting: *Deleted

## 2017-08-20 DIAGNOSIS — I428 Other cardiomyopathies: Secondary | ICD-10-CM | POA: Diagnosis not present

## 2017-08-20 NOTE — Progress Notes (Signed)
Remote ICD transmission.   

## 2017-08-22 LAB — CUP PACEART REMOTE DEVICE CHECK
Battery Remaining Longevity: 79 mo
Battery Voltage: 3 V
Brady Statistic AP VP Percent: 13.93 %
Brady Statistic AP VS Percent: 0.1 %
Brady Statistic AS VP Percent: 84.63 %
Brady Statistic AS VS Percent: 1.34 %
Brady Statistic RA Percent Paced: 13.92 %
Brady Statistic RV Percent Paced: 97.6 %
Date Time Interrogation Session: 20181016073423
HighPow Impedance: 67 Ohm
Implantable Lead Implant Date: 20160610
Implantable Lead Implant Date: 20160610
Implantable Lead Implant Date: 20160610
Implantable Lead Location: 753858
Implantable Lead Location: 753859
Implantable Lead Location: 753860
Implantable Lead Model: 4396
Implantable Lead Model: 5076
Implantable Pulse Generator Implant Date: 20160610
Lead Channel Impedance Value: 342 Ohm
Lead Channel Impedance Value: 399 Ohm
Lead Channel Impedance Value: 418 Ohm
Lead Channel Impedance Value: 532 Ohm
Lead Channel Impedance Value: 589 Ohm
Lead Channel Impedance Value: 646 Ohm
Lead Channel Pacing Threshold Amplitude: 0.5 V
Lead Channel Pacing Threshold Amplitude: 0.5 V
Lead Channel Pacing Threshold Amplitude: 0.875 V
Lead Channel Pacing Threshold Pulse Width: 0.4 ms
Lead Channel Pacing Threshold Pulse Width: 0.4 ms
Lead Channel Pacing Threshold Pulse Width: 0.4 ms
Lead Channel Sensing Intrinsic Amplitude: 17.25 mV
Lead Channel Sensing Intrinsic Amplitude: 17.25 mV
Lead Channel Sensing Intrinsic Amplitude: 4 mV
Lead Channel Sensing Intrinsic Amplitude: 4 mV
Lead Channel Setting Pacing Amplitude: 1.5 V
Lead Channel Setting Pacing Amplitude: 2 V
Lead Channel Setting Pacing Amplitude: 2 V
Lead Channel Setting Pacing Pulse Width: 0.4 ms
Lead Channel Setting Pacing Pulse Width: 0.4 ms
Lead Channel Setting Sensing Sensitivity: 0.3 mV

## 2017-08-23 ENCOUNTER — Encounter: Payer: Self-pay | Admitting: Cardiology

## 2017-09-17 ENCOUNTER — Ambulatory Visit (HOSPITAL_COMMUNITY)
Admission: RE | Admit: 2017-09-17 | Discharge: 2017-09-17 | Disposition: A | Discharge status: Home / Self Care | Payer: BLUE CROSS/BLUE SHIELD | Source: Ambulatory Visit | Attending: Cardiology | Admitting: Cardiology

## 2017-09-17 ENCOUNTER — Other Ambulatory Visit: Payer: Self-pay

## 2017-09-17 ENCOUNTER — Encounter (HOSPITAL_COMMUNITY): Payer: Self-pay | Admitting: Cardiology

## 2017-09-17 VITALS — BP 144/68 | HR 66 | Wt 190.8 lb

## 2017-09-17 DIAGNOSIS — I447 Left bundle-branch block, unspecified: Secondary | ICD-10-CM | POA: Diagnosis not present

## 2017-09-17 DIAGNOSIS — I739 Peripheral vascular disease, unspecified: Secondary | ICD-10-CM

## 2017-09-17 DIAGNOSIS — J986 Disorders of diaphragm: Secondary | ICD-10-CM | POA: Insufficient documentation

## 2017-09-17 DIAGNOSIS — I5022 Chronic systolic (congestive) heart failure: Secondary | ICD-10-CM | POA: Diagnosis not present

## 2017-09-17 DIAGNOSIS — G4733 Obstructive sleep apnea (adult) (pediatric): Secondary | ICD-10-CM | POA: Diagnosis not present

## 2017-09-17 DIAGNOSIS — I428 Other cardiomyopathies: Secondary | ICD-10-CM | POA: Insufficient documentation

## 2017-09-17 DIAGNOSIS — J449 Chronic obstructive pulmonary disease, unspecified: Secondary | ICD-10-CM | POA: Diagnosis not present

## 2017-09-17 LAB — BASIC METABOLIC PANEL
Anion gap: 8 (ref 5–15)
BUN: 13 mg/dL (ref 6–20)
CO2: 25 mmol/L (ref 22–32)
Calcium: 9.4 mg/dL (ref 8.9–10.3)
Chloride: 106 mmol/L (ref 101–111)
Creatinine, Ser: 0.86 mg/dL (ref 0.44–1.00)
GFR calc Af Amer: >60 mL/min (ref >60)
GFR calc non Af Amer: >60 mL/min (ref >60)
Glucose, Bld: 89 mg/dL (ref 65–99)
Potassium: 3.4 mmol/L — ABNORMAL LOW (ref 3.5–5.1)
Sodium: 139 mmol/L (ref 135–145)

## 2017-09-17 NOTE — Progress Notes (Signed)
Patient ID: MCKINZIE SAKSA, female   DOB: 04-01-1956, 61 y.o.   MRN: [id number] PCP: Dr. Shelia Media EP: Dr Lovena Le Cardiology: Dr Aundra Dubin   61 y.o. with history of HTN, intermittent LBBB, and nonischemic cardiomyopathy presents for cardiology followup.  She was admitted with chest pain in 6/13.  ECG showed intermittent LBBB.  Echo showed EF 45-50%.  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    Developed CP and dyspnea. She was set up for echo and Cardiolite in 12/15.  Echo showed EF 10-15% (significant fall).  She had a Lexiscan Cardiolite with EF 25% and apical anteroseptal and apical fixed perfusion defect.  Therefore, she was sent for left and right heart catheterization in 1/16.  This showed mild elevation in LV and RV filling pressures and no significant CAD.  She was started on Lasix 20 mg daily.  Echo in 5/16 showed EF 20-25%, mildly dilated and mildly dysfunctional RV.  PFTs 4/16 showed severe obstruction and restriction.  She has seen Dr Melvyn Novas.  She is noted to have an elevated right hemidiaphragm.   In 6/16, she had Medtronic CRT-D system placed.  In 7/16, CPX showed low normal functional capacity with the main limitation being pulmonary.  Repeat echo in 11/16 showed improvement in EF to 40-45%.  Echo in 7/18 showed stable EF 40-45%.   She returns today for CHF followup.  Weight is up 2 lbs. No significant dyspnea walking on flat ground. No problems walking up 1 flight of steps.  No chest pain.  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  Thighs feel weak when she walks. BP is high today but she just took her meds.     Medtronic device interrogated: fluid index < threshold with stable thoracic impedance, no VT or afib  Labs (12/13): LDL 80, HDL 46, K 4, creatinine 0.7 Labs (6/14): K 4, creatinine 0.8, LDL 73, HDL 45 Labs (12/15): K 4.2, creatinine 0.7, HCT 43.3, LDL 60, HDL 51, BNP 577, TSH normal Labs (2/16): K 4.2, creatinine 0.7 Labs (1/16): SPEP no M spike K 4.3 Creatinine  0.74  Labs (2/16): K 4.2, creatinine 0.78 Labs (02/23/2015) : K 4.2 Creatinine 0.90, BNP 339 Labs (04/13/2015): K 4.5 Creatinine 1.01  Labs (05/17/2015): K 4.7 Creatinine 0.71  Labs (11/16): K 4.1, creatinine 0.74 Labs (6/17): K 4.2, creatinine 0.85, BNP 76 Labs (1/18): LDL 55, HDL 42 Labs (7/18): K 3.9, creatinine 0.91  PMH: 1. HTN 2. Hyperlipidemia 3. COPD: Prior smoker.  PFTs (4/16) with FVC 37%, FEV1 36%, ratio 97%, TLC 67%, DLCO 32% => severe obstruction and restriction.  COPD + elevated right hemidiaphragm.  4. Intermittent LBBB/IVCD 5. Hypothyroidism 6. Cardiomyopathy: Nonischemic.  Echo (6/13) with EF 45-50%, paradoxical septal motion.  LHC (6/13) with 40% D1 stenosis, 30% PLV stenosis.  Echo (12/15) with EF 10-15%, moderately dilated LV, diffuse hypokinesis, moderate MR.   Lexiscan Cardiolite (12/15) with EF 25%, diffuse hypokinesis, apical anteroseptal and apical fixed defect. LHC/RHC (1/16) with mean RA 8, PA 37/19, mean PCWP 19, CI 2.63, EF 30%, no significant CAD.  Cardiac MRI 12/20/2014: EF 24% Mod mitral regurg thought to be from dilated LV, normal RV size, no LGE.  Echo (5/16) with EF 20-25%, mildly dilated and mildly dysfunctional RV, mild MR. Medtronic CRT-D placed 6/16.  CPX (7/16) with peak VO2 16.3, VE/VCO2 slope 25, RER 1.1, low normal functional capacity limited primarily by lung disease.  - Echo (11/16) with EF 40-45%, mild MR.  - Echo (7/18):  EF 40-45%, diffuse hypokinesis, grade II diastolic dysfunction.  7. 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  8. OSA: 04/18/2015 Sleep Study. She has not been able to tolerate CPAP.  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  Quit smoking in 1/16.    FH: Father CABG at 84   ROS: All systems reviewed and negative except as per HPI.   Current Outpatient Medications  Medication Sig Dispense Refill  . aspirin 81 MG tablet Take 81 mg by mouth daily.    Marland Kitchen atorvastatin  (LIPITOR) 10 MG tablet TAKE 1 TABLET BY MOUTH AT BEDTIME 30 tablet 6  . bisoprolol (ZEBETA) 10 MG tablet TAKE 1 TABLET BY MOUTH EVERY DAY 30 tablet 6  . Cholecalciferol (VITAMIN D-3 PO) Take 1,000 Units by mouth daily.     . fluticasone (FLONASE) 50 MCG/ACT nasal spray Place into both nostrils daily.    . furosemide (LASIX) 20 MG tablet TAKE 1 TABLET (20 MG TOTAL) BY MOUTH 2 (TWO) TIMES DAILY. 60 tablet 11  . HYDROcodone-acetaminophen (NORCO) 5-325 MG per tablet Take 1 tablet by mouth at bedtime.     . hydroxychloroquine (PLAQUENIL) 200 MG tablet Take 200 mg by mouth 2 (two) times daily.  5  . Inositol Niacinate (NIACIN FLUSH FREE) 500 MG CAPS Take 3 capsules by mouth daily.    Marland Kitchen levothyroxine (SYNTHROID, LEVOTHROID) 100 MCG tablet Take 100 mcg by mouth daily before breakfast.    . Multiple Vitamins-Minerals (MULTIVITAMIN WITH MINERALS) tablet Take 1 tablet by mouth daily.    . nitroGLYCERIN (NITROSTAT) 0.4 MG SL tablet Place 1 tablet (0.4 mg total) under the tongue every 5 (five) minutes as needed for chest pain. 25 tablet 3  . sacubitril-valsartan (ENTRESTO) 97-103 MG Take 1 tablet by mouth 2 (two) times daily. 60 tablet 6  . spironolactone (ALDACTONE) 25 MG tablet TAKE 1 TABLET (25 MG TOTAL) BY MOUTH DAILY. 30 tablet 3   No current facility-administered medications for this encounter.     BP (!) 144/68   Pulse 66   Wt 190 lb 12 oz (86.5 kg)   SpO2 99%   BMI 34.89 kg/m  General: NAD Neck: No JVD, no thyromegaly or thyroid nodule.  Lungs: Decreased breath sounds right base.  CV: Nondisplaced PMI.  Heart regular S1/S2, no S3/S4, no murmur.  No peripheral edema.  No carotid bruit.  No PT pulse palpable on right, 2+ PT on left.  Abdomen: Soft, nontender, no hepatosplenomegaly, no distention.  Skin: Intact without lesions or rashes.  Neurologic: Alert and oriented x 3.  Psych: Normal affect. Extremities: No clubbing or cyanosis.  HEENT: Normal.    Assessment/Plan: 1. Cardiomyopathy:  Nonischemic cardiomyopathy. In the past, she had EF 45-50% with no significant CAD (2013).  LBBB-related nonischemic cardiomyopathy.  However, in the fall of 2015 she developed exertional chest pain and dyspnea.  Echo and Cardiolite showed marked worsening of her LV systolic function (EF 69-62% by echo) and Cardiolite suggested possible prior MI.  Left heart cath in 1/16 showed no significant CAD.  RHC showed mildly elevated right and left heart filling pressures and normal CI. SPEP negative and no infiltrative disease on cardiac MRI. Repeat echo in 5/16 with EF 20-25%.  CPX showed limitations are related to lung disease as well as obesity, no HF limitation. Medtronic CRT-D placement.  Followup echo in 11/16 showed improvement in EF to 40-45%.  Most recent echo showed EF 40-45% still.  She is  not volume overloaded today by exam or by device interrogation.  NYHA class II symptoms.  - Continue Lasix 40 mg daily.  BMET today.  - Continue bisoprolol 10 mg daily.  - Continue Entresto to 97/103 bid.   - Continue spironolactone 25 mg daily.     2. Elevated right hemidiaphragm: This likely contributes to dyspnea.   3. OSA:  Has not been able to tolerate CPAP.    4. COPD: Severe by PFTs.  5. PAD: Thighs feel weak with ambulation bilaterally, unable to palpate right PT pulse.  - I will order screening ABIs.   Followup in 6 months.   Loralie Champagne 09/17/2017

## 2017-09-17 NOTE — Patient Instructions (Signed)
Labs drawn today (if we do not call you, then your lab work was stable)   Your physician has requested that you have an ankle brachial index (ABI). During this test an ultrasound and blood pressure cuff are used to evaluate the arteries that supply the arms and legs with blood. Allow thirty minutes for this exam. There are no restrictions or special instructions.  Your physician recommends that you schedule a follow-up appointment in: 6 months with Dr. Aundra Dubin

## 2017-09-19 ENCOUNTER — Ambulatory Visit: Payer: BLUE CROSS/BLUE SHIELD

## 2017-09-19 ENCOUNTER — Other Ambulatory Visit: Payer: BLUE CROSS/BLUE SHIELD

## 2017-09-19 ENCOUNTER — Telehealth (HOSPITAL_COMMUNITY): Payer: Self-pay

## 2017-09-19 DIAGNOSIS — I5022 Chronic systolic (congestive) heart failure: Secondary | ICD-10-CM

## 2017-09-19 MED ORDER — POTASSIUM CHLORIDE ER 10 MEQ PO TBCR: 10.0000 meq | EXTENDED_RELEASE_TABLET | Freq: Every day | ORAL | 3 refills | Status: DC

## 2017-09-19 NOTE — Telephone Encounter (Signed)
Notes recorded by Shirley Muscat, RN on 09/19/2017 at 1:09 PM EST Pt aware of results and agreeable to med changes, orders placed and Appt scheduled on 11/28  ------  Notes recorded by Shirley Muscat, RN on 09/18/2017 at 1:46 PM EST Left VM  ------  Notes recorded by Larey Dresser, MD on 09/17/2017 at 2:14 PM EST Add KCl 10 daily. BMET 10 days.

## 2017-10-02 ENCOUNTER — Ambulatory Visit (HOSPITAL_COMMUNITY)
Admission: RE | Admit: 2017-10-02 | Discharge: 2017-10-02 | Disposition: A | Discharge status: Home / Self Care | Payer: BLUE CROSS/BLUE SHIELD | Source: Ambulatory Visit | Attending: Internal Medicine | Admitting: Internal Medicine

## 2017-10-02 DIAGNOSIS — I5022 Chronic systolic (congestive) heart failure: Secondary | ICD-10-CM | POA: Insufficient documentation

## 2017-10-02 LAB — BASIC METABOLIC PANEL
Anion gap: 5 (ref 5–15)
BUN: 15 mg/dL (ref 6–20)
CO2: 26 mmol/L (ref 22–32)
Calcium: 9.3 mg/dL (ref 8.9–10.3)
Chloride: 108 mmol/L (ref 101–111)
Creatinine, Ser: 0.98 mg/dL (ref 0.44–1.00)
GFR calc Af Amer: >60 mL/min (ref >60)
GFR calc non Af Amer: >60 mL/min (ref >60)
Glucose, Bld: 93 mg/dL (ref 65–99)
Potassium: 3.8 mmol/L (ref 3.5–5.1)
Sodium: 139 mmol/L (ref 135–145)

## 2017-10-08 ENCOUNTER — Other Ambulatory Visit: Payer: Self-pay | Admitting: Internal Medicine

## 2017-10-10 ENCOUNTER — Ambulatory Visit
Admission: RE | Admit: 2017-10-10 | Discharge: 2017-10-10 | Disposition: A | Discharge status: Home / Self Care | Payer: Medicare HMO | Source: Ambulatory Visit | Attending: Internal Medicine | Admitting: Internal Medicine

## 2017-10-10 ENCOUNTER — Ambulatory Visit
Admission: RE | Admit: 2017-10-10 | Discharge: 2017-10-10 | Disposition: A | Discharge status: Home / Self Care | Payer: Medicare HMO | Source: Ambulatory Visit | Attending: Endocrinology | Admitting: Endocrinology

## 2017-10-10 DIAGNOSIS — Z780 Asymptomatic menopausal state: Secondary | ICD-10-CM | POA: Diagnosis not present

## 2017-10-10 DIAGNOSIS — Z1231 Encounter for screening mammogram for malignant neoplasm of breast: Secondary | ICD-10-CM

## 2017-10-10 DIAGNOSIS — M8589 Other specified disorders of bone density and structure, multiple sites: Secondary | ICD-10-CM | POA: Diagnosis not present

## 2017-10-10 DIAGNOSIS — M859 Disorder of bone density and structure, unspecified: Secondary | ICD-10-CM

## 2017-10-17 ENCOUNTER — Other Ambulatory Visit (HOSPITAL_COMMUNITY): Payer: Self-pay | Admitting: Cardiology

## 2017-10-17 ENCOUNTER — Ambulatory Visit (HOSPITAL_COMMUNITY)
Admission: RE | Admit: 2017-10-17 | Discharge: 2017-10-17 | Disposition: A | Discharge status: Home / Self Care | Payer: Medicare HMO | Source: Ambulatory Visit | Attending: Internal Medicine | Admitting: Internal Medicine

## 2017-10-17 DIAGNOSIS — I739 Peripheral vascular disease, unspecified: Secondary | ICD-10-CM

## 2017-10-26 ENCOUNTER — Other Ambulatory Visit (HOSPITAL_COMMUNITY): Payer: Self-pay | Admitting: Cardiology

## 2017-11-19 ENCOUNTER — Ambulatory Visit (INDEPENDENT_AMBULATORY_CARE_PROVIDER_SITE_OTHER): Payer: Medicare HMO | Admitting: *Deleted

## 2017-11-19 DIAGNOSIS — I428 Other cardiomyopathies: Secondary | ICD-10-CM

## 2017-11-20 DIAGNOSIS — M1990 Unspecified osteoarthritis, unspecified site: Secondary | ICD-10-CM | POA: Diagnosis not present

## 2017-11-20 DIAGNOSIS — M150 Primary generalized (osteo)arthritis: Secondary | ICD-10-CM | POA: Diagnosis not present

## 2017-11-20 DIAGNOSIS — E7800 Pure hypercholesterolemia, unspecified: Secondary | ICD-10-CM | POA: Diagnosis not present

## 2017-11-20 DIAGNOSIS — Z79899 Other long term (current) drug therapy: Secondary | ICD-10-CM | POA: Diagnosis not present

## 2017-11-20 DIAGNOSIS — M5136 Other intervertebral disc degeneration, lumbar region: Secondary | ICD-10-CM | POA: Diagnosis not present

## 2017-11-20 NOTE — Progress Notes (Signed)
Remote ICD transmission.   

## 2017-11-21 LAB — CUP PACEART REMOTE DEVICE CHECK
Battery Remaining Longevity: 76 mo
Battery Voltage: 2.99 V
Brady Statistic AP VP Percent: 14.63 %
Brady Statistic AP VS Percent: 0.06 %
Brady Statistic AS VP Percent: 84.06 %
Brady Statistic AS VS Percent: 1.25 %
Brady Statistic RA Percent Paced: 14.55 %
Brady Statistic RV Percent Paced: 97.59 %
Date Time Interrogation Session: 20190115072603
HighPow Impedance: 63 Ohm
Implantable Lead Implant Date: 20160610
Implantable Lead Implant Date: 20160610
Implantable Lead Implant Date: 20160610
Implantable Lead Location: 753858
Implantable Lead Location: 753859
Implantable Lead Location: 753860
Implantable Lead Model: 4396
Implantable Lead Model: 5076
Implantable Pulse Generator Implant Date: 20160610
Lead Channel Impedance Value: 361 Ohm
Lead Channel Impedance Value: 418 Ohm
Lead Channel Impedance Value: 456 Ohm
Lead Channel Impedance Value: 513 Ohm
Lead Channel Impedance Value: 513 Ohm
Lead Channel Impedance Value: 760 Ohm
Lead Channel Pacing Threshold Amplitude: 0.375 V
Lead Channel Pacing Threshold Amplitude: 0.625 V
Lead Channel Pacing Threshold Amplitude: 1 V
Lead Channel Pacing Threshold Pulse Width: 0.4 ms
Lead Channel Pacing Threshold Pulse Width: 0.4 ms
Lead Channel Pacing Threshold Pulse Width: 0.4 ms
Lead Channel Sensing Intrinsic Amplitude: 14.75 mV
Lead Channel Sensing Intrinsic Amplitude: 14.75 mV
Lead Channel Sensing Intrinsic Amplitude: 4 mV
Lead Channel Sensing Intrinsic Amplitude: 4 mV
Lead Channel Setting Pacing Amplitude: 1.5 V
Lead Channel Setting Pacing Amplitude: 2 V
Lead Channel Setting Pacing Amplitude: 2 V
Lead Channel Setting Pacing Pulse Width: 0.4 ms
Lead Channel Setting Pacing Pulse Width: 0.4 ms
Lead Channel Setting Sensing Sensitivity: 0.3 mV

## 2017-11-22 ENCOUNTER — Encounter: Payer: Self-pay | Admitting: Cardiology

## 2017-11-27 DIAGNOSIS — R69 Illness, unspecified: Secondary | ICD-10-CM | POA: Diagnosis not present

## 2017-11-27 DIAGNOSIS — E7800 Pure hypercholesterolemia, unspecified: Secondary | ICD-10-CM | POA: Diagnosis not present

## 2017-11-27 DIAGNOSIS — I10 Essential (primary) hypertension: Secondary | ICD-10-CM | POA: Diagnosis not present

## 2017-11-27 DIAGNOSIS — E890 Postprocedural hypothyroidism: Secondary | ICD-10-CM | POA: Diagnosis not present

## 2017-12-07 ENCOUNTER — Other Ambulatory Visit (HOSPITAL_COMMUNITY): Payer: Self-pay | Admitting: Cardiology

## 2017-12-21 ENCOUNTER — Other Ambulatory Visit: Payer: Self-pay | Admitting: Internal Medicine

## 2017-12-23 ENCOUNTER — Other Ambulatory Visit: Payer: Self-pay | Admitting: Internal Medicine

## 2018-01-06 ENCOUNTER — Other Ambulatory Visit (HOSPITAL_COMMUNITY): Payer: Self-pay | Admitting: Cardiology

## 2018-02-18 ENCOUNTER — Ambulatory Visit (INDEPENDENT_AMBULATORY_CARE_PROVIDER_SITE_OTHER): Payer: Medicare HMO | Admitting: *Deleted

## 2018-02-18 DIAGNOSIS — I428 Other cardiomyopathies: Secondary | ICD-10-CM

## 2018-02-18 NOTE — Progress Notes (Signed)
Remote ICD transmission.   

## 2018-02-19 ENCOUNTER — Other Ambulatory Visit (HOSPITAL_COMMUNITY): Payer: Self-pay | Admitting: Cardiology

## 2018-02-19 LAB — CUP PACEART REMOTE DEVICE CHECK
Battery Remaining Longevity: 74 mo
Battery Voltage: 2.99 V
Brady Statistic AP VP Percent: 15.66 %
Brady Statistic AP VS Percent: 0.19 %
Brady Statistic AS VP Percent: 82.99 %
Brady Statistic AS VS Percent: 1.16 %
Brady Statistic RA Percent Paced: 15.78 %
Brady Statistic RV Percent Paced: 97.6 %
Date Time Interrogation Session: 20190416084222
HighPow Impedance: 67 Ohm
Implantable Lead Implant Date: 20160610
Implantable Lead Implant Date: 20160610
Implantable Lead Implant Date: 20160610
Implantable Lead Location: 753858
Implantable Lead Location: 753859
Implantable Lead Location: 753860
Implantable Lead Model: 4396
Implantable Lead Model: 5076
Implantable Pulse Generator Implant Date: 20160610
Lead Channel Impedance Value: 361 Ohm
Lead Channel Impedance Value: 361 Ohm
Lead Channel Impedance Value: 456 Ohm
Lead Channel Impedance Value: 532 Ohm
Lead Channel Impedance Value: 551 Ohm
Lead Channel Impedance Value: 703 Ohm
Lead Channel Pacing Threshold Amplitude: 0.375 V
Lead Channel Pacing Threshold Amplitude: 0.5 V
Lead Channel Pacing Threshold Amplitude: 0.75 V
Lead Channel Pacing Threshold Pulse Width: 0.4 ms
Lead Channel Pacing Threshold Pulse Width: 0.4 ms
Lead Channel Pacing Threshold Pulse Width: 0.4 ms
Lead Channel Sensing Intrinsic Amplitude: 17.25 mV
Lead Channel Sensing Intrinsic Amplitude: 17.25 mV
Lead Channel Sensing Intrinsic Amplitude: 3.875 mV
Lead Channel Sensing Intrinsic Amplitude: 3.875 mV
Lead Channel Setting Pacing Amplitude: 1.5 V
Lead Channel Setting Pacing Amplitude: 1.75 V
Lead Channel Setting Pacing Amplitude: 2 V
Lead Channel Setting Pacing Pulse Width: 0.4 ms
Lead Channel Setting Pacing Pulse Width: 0.4 ms
Lead Channel Setting Sensing Sensitivity: 0.3 mV

## 2018-02-20 ENCOUNTER — Encounter: Payer: Self-pay | Admitting: Cardiology

## 2018-03-11 DIAGNOSIS — I10 Essential (primary) hypertension: Secondary | ICD-10-CM | POA: Diagnosis not present

## 2018-03-11 DIAGNOSIS — M47816 Spondylosis without myelopathy or radiculopathy, lumbar region: Secondary | ICD-10-CM | POA: Diagnosis not present

## 2018-03-11 DIAGNOSIS — Z79899 Other long term (current) drug therapy: Secondary | ICD-10-CM | POA: Diagnosis not present

## 2018-03-11 DIAGNOSIS — E7800 Pure hypercholesterolemia, unspecified: Secondary | ICD-10-CM | POA: Diagnosis not present

## 2018-03-11 DIAGNOSIS — M064 Inflammatory polyarthropathy: Secondary | ICD-10-CM | POA: Diagnosis not present

## 2018-03-21 ENCOUNTER — Other Ambulatory Visit: Payer: Self-pay | Admitting: Internal Medicine

## 2018-04-10 ENCOUNTER — Other Ambulatory Visit: Payer: Self-pay | Admitting: Cardiology

## 2018-04-10 NOTE — Telephone Encounter (Signed)
Dr.McLean this medication is on backorder. The pharmacy requests an alternative. Please advise.

## 2018-04-11 ENCOUNTER — Other Ambulatory Visit (HOSPITAL_COMMUNITY): Payer: Self-pay | Admitting: *Deleted

## 2018-04-11 MED ORDER — CARVEDILOL 6.25 MG PO TABS: 6.2500 mg | ORAL_TABLET | Freq: Two times a day (BID) | ORAL | 3 refills | Status: DC

## 2018-04-11 NOTE — Telephone Encounter (Signed)
Dr.McLean this medication is on backorder. The pharmacy requests an alternative. Please advise.

## 2018-04-19 ENCOUNTER — Other Ambulatory Visit (HOSPITAL_COMMUNITY): Payer: Self-pay | Admitting: Internal Medicine

## 2018-04-21 ENCOUNTER — Encounter (HOSPITAL_COMMUNITY): Payer: Medicare HMO | Admitting: Cardiology

## 2018-04-23 ENCOUNTER — Encounter (HOSPITAL_COMMUNITY): Payer: Self-pay | Admitting: Cardiology

## 2018-04-23 ENCOUNTER — Other Ambulatory Visit: Payer: Self-pay

## 2018-04-23 ENCOUNTER — Ambulatory Visit (HOSPITAL_COMMUNITY)
Admission: RE | Admit: 2018-04-23 | Discharge: 2018-04-23 | Disposition: A | Discharge status: Home / Self Care | Payer: Medicare HMO | Source: Ambulatory Visit | Attending: Cardiology | Admitting: Cardiology

## 2018-04-23 VITALS — BP 130/58 | HR 76 | Wt 191.0 lb

## 2018-04-23 DIAGNOSIS — I10 Essential (primary) hypertension: Secondary | ICD-10-CM | POA: Diagnosis present

## 2018-04-23 DIAGNOSIS — Z7982 Long term (current) use of aspirin: Secondary | ICD-10-CM | POA: Insufficient documentation

## 2018-04-23 DIAGNOSIS — Z87891 Personal history of nicotine dependence: Secondary | ICD-10-CM | POA: Insufficient documentation

## 2018-04-23 DIAGNOSIS — E039 Hypothyroidism, unspecified: Secondary | ICD-10-CM | POA: Insufficient documentation

## 2018-04-23 DIAGNOSIS — J449 Chronic obstructive pulmonary disease, unspecified: Secondary | ICD-10-CM | POA: Insufficient documentation

## 2018-04-23 DIAGNOSIS — I429 Cardiomyopathy, unspecified: Secondary | ICD-10-CM | POA: Insufficient documentation

## 2018-04-23 DIAGNOSIS — Z79899 Other long term (current) drug therapy: Secondary | ICD-10-CM | POA: Insufficient documentation

## 2018-04-23 DIAGNOSIS — I5022 Chronic systolic (congestive) heart failure: Secondary | ICD-10-CM

## 2018-04-23 DIAGNOSIS — I447 Left bundle-branch block, unspecified: Secondary | ICD-10-CM | POA: Insufficient documentation

## 2018-04-23 DIAGNOSIS — G4733 Obstructive sleep apnea (adult) (pediatric): Secondary | ICD-10-CM | POA: Diagnosis not present

## 2018-04-23 DIAGNOSIS — E785 Hyperlipidemia, unspecified: Secondary | ICD-10-CM | POA: Insufficient documentation

## 2018-04-23 LAB — BASIC METABOLIC PANEL
Anion gap: 7 (ref 5–15)
BUN: 15 mg/dL (ref 6–20)
CO2: 28 mmol/L (ref 22–32)
Calcium: 9.9 mg/dL (ref 8.9–10.3)
Chloride: 105 mmol/L (ref 101–111)
Creatinine, Ser: 0.93 mg/dL (ref 0.44–1.00)
GFR calc Af Amer: >60 mL/min (ref >60)
GFR calc non Af Amer: >60 mL/min (ref >60)
Glucose, Bld: 101 mg/dL — ABNORMAL HIGH (ref 65–99)
Potassium: 4 mmol/L (ref 3.5–5.1)
Sodium: 140 mmol/L (ref 135–145)

## 2018-04-23 MED ORDER — CARVEDILOL 12.5 MG PO TABS: 12.5000 mg | ORAL_TABLET | Freq: Two times a day (BID) | ORAL | 3 refills | Status: DC

## 2018-04-23 NOTE — Patient Instructions (Addendum)
Increase Carvedilol 12.5 mg (1 tab), twice a day  Your physician has requested that you have an echocardiogram. Echocardiography is a painless test that uses sound waves to create images of your heart. It provides your doctor with information about the size and shape of your heart and how well your heart's chambers and valves are working. This procedure takes approximately one hour. There are no restrictions for this procedure. (they will call you)   Labs drawn today (if we do not call you, then your lab work was stable)   Your physician recommends that you schedule a follow-up appointment in: 4 months with Dr. Aundra Dubin  Please Call an Schedule Appointment  (call in August)

## 2018-04-23 NOTE — Progress Notes (Signed)
Patient ID: Stacy Hernandez, female   DOB: 03-20-1956, 62 y.o.   MRN: [id number] PCP: Dr. Shelia Media EP: Dr Lovena Le Cardiology: Dr Aundra Dubin   62 y.o. with history of HTN, intermittent LBBB, and nonischemic cardiomyopathy presents for cardiology followup.  She was admitted with chest pain in 6/13.  ECG showed intermittent LBBB.  Echo showed EF 45-50%.  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    Developed CP and dyspnea. She was set up for echo and Cardiolite in 12/15.  Echo showed EF 10-15% (significant fall).  She had a Lexiscan Cardiolite with EF 25% and apical anteroseptal and apical fixed perfusion defect.  Therefore, she was sent for left and right heart catheterization in 1/16.  This showed mild elevation in LV and RV filling pressures and no significant CAD.  She was started on Lasix 20 mg daily.  Echo in 5/16 showed EF 20-25%, mildly dilated and mildly dysfunctional RV.  PFTs 4/16 showed severe obstruction and restriction.  She has seen Dr Melvyn Novas.  She is noted to have an elevated right hemidiaphragm.   In 6/16, she had Medtronic CRT-D system placed.  In 7/16, CPX showed low normal functional capacity with the main limitation being pulmonary.  Repeat echo in 11/16 showed improvement in EF to 40-45%.  Echo in 7/18 showed stable EF 40-45%.   She returns today for CHF followup.  Weight is stable.  No dyspnea walking on flat ground. Mild dyspnea with a flight of stairs.  No orthopnea/PND. No chest pain.     Medtronic device interrogated: fluid index < threshold with stable thoracic impedance, No VT or afib, >99% BiV pacing  Labs (12/13): LDL 80, HDL 46, K 4, creatinine 0.7 Labs (6/14): K 4, creatinine 0.8, LDL 73, HDL 45 Labs (12/15): K 4.2, creatinine 0.7, HCT 43.3, LDL 60, HDL 51, BNP 577, TSH normal Labs (2/16): K 4.2, creatinine 0.7 Labs (1/16): SPEP no M spike K 4.3 Creatinine 0.74  Labs (2/16): K 4.2, creatinine 0.78 Labs (02/23/2015) : K 4.2 Creatinine 0.90, BNP 339 Labs (04/13/2015): K 4.5  Creatinine 1.01  Labs (05/17/2015): K 4.7 Creatinine 0.71  Labs (11/16): K 4.1, creatinine 0.74 Labs (6/17): K 4.2, creatinine 0.85, BNP 76 Labs (1/18): LDL 55, HDL 42 Labs (7/18): K 3.9, creatinine 0.91 Labs (1/19): K 4, creatinine 1.0, LDL 59, hgb 13.7  PMH: 1. HTN 2. Hyperlipidemia 3. COPD: Prior smoker.  PFTs (4/16) with FVC 37%, FEV1 36%, ratio 97%, TLC 67%, DLCO 32% => severe obstruction and restriction.  COPD + elevated right hemidiaphragm.  4. Intermittent LBBB/IVCD 5. Hypothyroidism 6. Cardiomyopathy: Nonischemic.  Echo (6/13) with EF 45-50%, paradoxical septal motion.  LHC (6/13) with 40% D1 stenosis, 30% PLV stenosis.  Echo (12/15) with EF 10-15%, moderately dilated LV, diffuse hypokinesis, moderate MR.   Lexiscan Cardiolite (12/15) with EF 25%, diffuse hypokinesis, apical anteroseptal and apical fixed defect. LHC/RHC (1/16) with mean RA 8, PA 37/19, mean PCWP 19, CI 2.63, EF 30%, no significant CAD.  Cardiac MRI 12/20/2014: EF 24% Mod mitral regurg thought to be from dilated LV, normal RV size, no LGE.  Echo (5/16) with EF 20-25%, mildly dilated and mildly dysfunctional RV, mild MR. Medtronic CRT-D placed 6/16.  CPX (7/16) with peak VO2 16.3, VE/VCO2 slope 25, RER 1.1, low normal functional capacity limited primarily by lung disease.  - Echo (11/16) with EF 40-45%, mild MR.  - Echo (7/18): EF 40-45%, diffuse hypokinesis, grade II diastolic dysfunction.  7. Elevated right hemidiaphragm: Sniff test 5/16 suggested  paralysis.  8. OSA: 04/18/2015 Sleep Study. She has not been able to tolerate CPAP. 9. 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  Quit smoking in 1/16.    FH: Father CABG at 76   ROS: All systems reviewed and negative except as per HPI.   Current Outpatient Medications  Medication Sig Dispense Refill  . aspirin 81 MG tablet Take 81 mg by mouth daily.    Marland Kitchen atorvastatin (LIPITOR) 10 MG tablet TAKE 1 TABLET BY MOUTH  AT BEDTIME 30 tablet 6  . carvedilol (COREG) 12.5 MG tablet Take 1 tablet (12.5 mg total) by mouth 2 (two) times daily. 60 tablet 3  . Cholecalciferol (VITAMIN D-3 PO) Take 1,000 Units by mouth daily.     Marland Kitchen ENTRESTO 97-103 MG TAKE 1 TABLET BY MOUTH TWICE A DAY 60 tablet 6  . fluticasone (FLONASE) 50 MCG/ACT nasal spray Place into both nostrils daily.    . furosemide (LASIX) 20 MG tablet TAKE 1 TABLET BY MOUTH TWICE A DAY 60 tablet 11  . HYDROcodone-acetaminophen (NORCO) 5-325 MG per tablet Take 1 tablet by mouth at bedtime.     . hydroxychloroquine (PLAQUENIL) 200 MG tablet Take 200 mg by mouth 2 (two) times daily.  5  . Inositol Niacinate (NIACIN FLUSH FREE) 500 MG CAPS Take 3 capsules by mouth daily.    Marland Kitchen KLOR-CON 10 10 MEQ tablet TAKE 1 TABLET BY MOUTH EVERY DAY 30 tablet 2  . levothyroxine (SYNTHROID, LEVOTHROID) 100 MCG tablet Take 100 mcg by mouth daily before breakfast.    . Multiple Vitamins-Minerals (MULTIVITAMIN WITH MINERALS) tablet Take 1 tablet by mouth daily.    . nitroGLYCERIN (NITROSTAT) 0.4 MG SL tablet Place 1 tablet (0.4 mg total) under the tongue every 5 (five) minutes as needed for chest pain. 25 tablet 3  . spironolactone (ALDACTONE) 25 MG tablet TAKE 1 TABLET BY MOUTH EVERY DAY 30 tablet 3   No current facility-administered medications for this encounter.     BP (!) 130/58   Pulse 76   Wt 191 lb (86.6 kg)   SpO2 97%   BMI 34.93 kg/m  General: NAD Neck: No JVD, no thyromegaly or thyroid nodule.  Lungs: Clear to auscultation bilaterally with normal respiratory effort. CV: Nondisplaced PMI.  Heart regular S1/S2, no S3/S4, no murmur.  No peripheral edema.  No carotid bruit.  Difficult to palpate pedal pulses.  Abdomen: Soft, nontender, no hepatosplenomegaly, no distention.  Skin: Intact without lesions or rashes.  Neurologic: Alert and oriented x 3.  Psych: Normal affect. Extremities: No clubbing or cyanosis.  HEENT: Normal.    Assessment/Plan: 1.  Cardiomyopathy: Nonischemic cardiomyopathy. In the past, she had EF 45-50% with no significant CAD (2013).  LBBB-related nonischemic cardiomyopathy.  However, in the fall of 2015 she developed exertional chest pain and dyspnea.  Echo and Cardiolite showed marked worsening of her LV systolic function (EF 41-66% by echo) and Cardiolite suggested possible prior MI.  Left heart cath in 1/16 showed no significant CAD.  RHC showed mildly elevated right and left heart filling pressures and normal CI. SPEP negative and no infiltrative disease on cardiac MRI. Repeat echo in 5/16 with EF 20-25%.  CPX showed limitations are related to lung disease as well as obesity, no HF limitation. Medtronic CRT-D placement.  Followup echo in 11/16 showed improvement in EF to 40-45%.  Most recent echo showed EF 40-45% still.  She is not volume overloaded today  by exam or by device interrogation.  NYHA class II symptoms. - Continue Lasix 20 mg bid.  BMET today.  - Increase Coreg to 12.5 mg bid.   - Continue Entresto to 97/103 bid.   - Continue spironolactone 25 mg daily.     - Repeat echo in 7/19.  2. Elevated right hemidiaphragm: This likely contributes to dyspnea.   3. OSA:  Has not been able to tolerate CPAP.    4. COPD: Severe by PFTs.   Followup in 4 months.   Loralie Champagne 04/23/2018

## 2018-05-15 ENCOUNTER — Other Ambulatory Visit: Payer: Self-pay | Admitting: Cardiology

## 2018-05-19 ENCOUNTER — Ambulatory Visit (HOSPITAL_COMMUNITY): Payer: Medicare HMO | Attending: Cardiovascular Disease

## 2018-05-19 ENCOUNTER — Other Ambulatory Visit: Payer: Self-pay

## 2018-05-19 DIAGNOSIS — I340 Nonrheumatic mitral (valve) insufficiency: Secondary | ICD-10-CM | POA: Diagnosis not present

## 2018-05-19 DIAGNOSIS — I429 Cardiomyopathy, unspecified: Secondary | ICD-10-CM | POA: Insufficient documentation

## 2018-05-19 DIAGNOSIS — R0600 Dyspnea, unspecified: Secondary | ICD-10-CM | POA: Diagnosis not present

## 2018-05-19 DIAGNOSIS — I5022 Chronic systolic (congestive) heart failure: Secondary | ICD-10-CM | POA: Diagnosis not present

## 2018-05-19 DIAGNOSIS — J449 Chronic obstructive pulmonary disease, unspecified: Secondary | ICD-10-CM | POA: Diagnosis not present

## 2018-05-19 DIAGNOSIS — E785 Hyperlipidemia, unspecified: Secondary | ICD-10-CM | POA: Insufficient documentation

## 2018-05-19 DIAGNOSIS — R079 Chest pain, unspecified: Secondary | ICD-10-CM | POA: Diagnosis not present

## 2018-05-19 DIAGNOSIS — I447 Left bundle-branch block, unspecified: Secondary | ICD-10-CM | POA: Insufficient documentation

## 2018-05-19 DIAGNOSIS — I110 Hypertensive heart disease with heart failure: Secondary | ICD-10-CM | POA: Insufficient documentation

## 2018-05-19 DIAGNOSIS — G4733 Obstructive sleep apnea (adult) (pediatric): Secondary | ICD-10-CM | POA: Diagnosis not present

## 2018-05-20 ENCOUNTER — Ambulatory Visit (INDEPENDENT_AMBULATORY_CARE_PROVIDER_SITE_OTHER): Payer: Medicare HMO | Admitting: *Deleted

## 2018-05-20 DIAGNOSIS — M1990 Unspecified osteoarthritis, unspecified site: Secondary | ICD-10-CM | POA: Diagnosis not present

## 2018-05-20 DIAGNOSIS — I429 Cardiomyopathy, unspecified: Secondary | ICD-10-CM | POA: Diagnosis not present

## 2018-05-20 DIAGNOSIS — M5136 Other intervertebral disc degeneration, lumbar region: Secondary | ICD-10-CM | POA: Diagnosis not present

## 2018-05-20 DIAGNOSIS — I428 Other cardiomyopathies: Secondary | ICD-10-CM | POA: Diagnosis not present

## 2018-05-20 DIAGNOSIS — M150 Primary generalized (osteo)arthritis: Secondary | ICD-10-CM | POA: Diagnosis not present

## 2018-05-20 LAB — CUP PACEART REMOTE DEVICE CHECK
Battery Remaining Longevity: 70 mo
Battery Voltage: 2.99 V
Brady Statistic AP VP Percent: 12.12 %
Brady Statistic AP VS Percent: 0.19 %
Brady Statistic AS VP Percent: 86.47 %
Brady Statistic AS VS Percent: 1.21 %
Brady Statistic RA Percent Paced: 12.3 %
Brady Statistic RV Percent Paced: 97.64 %
Date Time Interrogation Session: 20190716073525
HighPow Impedance: 70 Ohm
Implantable Lead Implant Date: 20160610
Implantable Lead Implant Date: 20160610
Implantable Lead Implant Date: 20160610
Implantable Lead Location: 753858
Implantable Lead Location: 753859
Implantable Lead Location: 753860
Implantable Lead Model: 4396
Implantable Lead Model: 5076
Implantable Pulse Generator Implant Date: 20160610
Lead Channel Impedance Value: 342 Ohm
Lead Channel Impedance Value: 399 Ohm
Lead Channel Impedance Value: 456 Ohm
Lead Channel Impedance Value: 532 Ohm
Lead Channel Impedance Value: 589 Ohm
Lead Channel Impedance Value: 665 Ohm
Lead Channel Pacing Threshold Amplitude: 0.5 V
Lead Channel Pacing Threshold Amplitude: 0.5 V
Lead Channel Pacing Threshold Amplitude: 1 V
Lead Channel Pacing Threshold Pulse Width: 0.4 ms
Lead Channel Pacing Threshold Pulse Width: 0.4 ms
Lead Channel Pacing Threshold Pulse Width: 0.4 ms
Lead Channel Sensing Intrinsic Amplitude: 14.125 mV
Lead Channel Sensing Intrinsic Amplitude: 14.125 mV
Lead Channel Sensing Intrinsic Amplitude: 3.25 mV
Lead Channel Sensing Intrinsic Amplitude: 3.25 mV
Lead Channel Setting Pacing Amplitude: 1.5 V
Lead Channel Setting Pacing Amplitude: 2 V
Lead Channel Setting Pacing Amplitude: 2 V
Lead Channel Setting Pacing Pulse Width: 0.4 ms
Lead Channel Setting Pacing Pulse Width: 0.4 ms
Lead Channel Setting Sensing Sensitivity: 0.3 mV

## 2018-05-20 NOTE — Progress Notes (Signed)
Remote ICD transmission.   

## 2018-05-21 ENCOUNTER — Encounter: Payer: Self-pay | Admitting: Cardiology

## 2018-05-21 ENCOUNTER — Ambulatory Visit: Payer: Medicare HMO | Admitting: Internal Medicine

## 2018-05-21 ENCOUNTER — Encounter (INDEPENDENT_AMBULATORY_CARE_PROVIDER_SITE_OTHER): Payer: Self-pay

## 2018-05-21 ENCOUNTER — Encounter: Payer: Self-pay | Admitting: Internal Medicine

## 2018-05-21 VITALS — BP 126/74 | HR 74 | Ht 62.0 in | Wt 192.0 lb

## 2018-05-21 DIAGNOSIS — Z95810 Presence of automatic (implantable) cardiac defibrillator: Secondary | ICD-10-CM

## 2018-05-21 DIAGNOSIS — I5022 Chronic systolic (congestive) heart failure: Secondary | ICD-10-CM | POA: Diagnosis not present

## 2018-05-21 DIAGNOSIS — I428 Other cardiomyopathies: Secondary | ICD-10-CM

## 2018-05-21 LAB — CUP PACEART INCLINIC DEVICE CHECK
Battery Remaining Longevity: 71 mo
Battery Voltage: 2.98 V
Brady Statistic AP VP Percent: 14.07 %
Brady Statistic AP VS Percent: 0.13 %
Brady Statistic AS VP Percent: 84.56 %
Brady Statistic AS VS Percent: 1.24 %
Brady Statistic RA Percent Paced: 14.12 %
Brady Statistic RV Percent Paced: 97.61 %
Date Time Interrogation Session: 20190717132945
HighPow Impedance: 64 Ohm
Implantable Lead Implant Date: 20160610
Implantable Lead Implant Date: 20160610
Implantable Lead Implant Date: 20160610
Implantable Lead Location: 753858
Implantable Lead Location: 753859
Implantable Lead Location: 753860
Implantable Lead Model: 4396
Implantable Lead Model: 5076
Implantable Pulse Generator Implant Date: 20160610
Lead Channel Impedance Value: 361 Ohm
Lead Channel Impedance Value: 456 Ohm
Lead Channel Impedance Value: 475 Ohm
Lead Channel Impedance Value: 532 Ohm
Lead Channel Impedance Value: 608 Ohm
Lead Channel Impedance Value: 779 Ohm
Lead Channel Pacing Threshold Amplitude: 0.5 V
Lead Channel Pacing Threshold Amplitude: 0.5 V
Lead Channel Pacing Threshold Amplitude: 1.25 V
Lead Channel Pacing Threshold Pulse Width: 0.4 ms
Lead Channel Pacing Threshold Pulse Width: 0.4 ms
Lead Channel Pacing Threshold Pulse Width: 0.4 ms
Lead Channel Sensing Intrinsic Amplitude: 16.375 mV
Lead Channel Sensing Intrinsic Amplitude: 3.25 mV
Lead Channel Setting Pacing Amplitude: 1.5 V
Lead Channel Setting Pacing Amplitude: 2 V
Lead Channel Setting Pacing Amplitude: 2 V
Lead Channel Setting Pacing Pulse Width: 0.4 ms
Lead Channel Setting Pacing Pulse Width: 0.4 ms
Lead Channel Setting Sensing Sensitivity: 0.3 mV

## 2018-05-21 NOTE — Patient Instructions (Signed)
Medication Instructions:  Your physician recommends that you continue on your current medications as directed. Please refer to the Current Medication list given to you today.  Labwork: None ordered.  Testing/Procedures: None ordered.  Follow-Up: Your physician wants you to follow-up in: one year with Dr. Lovena Le.   You will receive a reminder letter in the mail two months in advance. If you don't receive a letter, please call our office to schedule the follow-up appointment.  Remote monitoring is used to monitor your ICD from home. This monitoring reduces the number of office visits required to check your device to one time per year. It allows [place] to keep an eye on the functioning of your device to ensure it is working properly. You are scheduled for a device check from home on 08/19/2018. You may send your transmission at any time that day. If you have a wireless device, the transmission will be sent automatically. After your physician reviews your transmission, you will receive a postcard with your next transmission date.  Any Other Special Instructions Will Be Listed Below (If Applicable).  If you need a refill on your cardiac medications before your next appointment, please call your pharmacy.
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
## 2018-05-26 DIAGNOSIS — E559 Vitamin D deficiency, unspecified: Secondary | ICD-10-CM | POA: Diagnosis not present

## 2018-05-26 DIAGNOSIS — E039 Hypothyroidism, unspecified: Secondary | ICD-10-CM | POA: Diagnosis not present

## 2018-05-26 DIAGNOSIS — Z0000 Encounter for general adult medical examination without abnormal findings: Secondary | ICD-10-CM | POA: Diagnosis not present

## 2018-05-29 DIAGNOSIS — E890 Postprocedural hypothyroidism: Secondary | ICD-10-CM | POA: Diagnosis not present

## 2018-05-29 DIAGNOSIS — E7800 Pure hypercholesterolemia, unspecified: Secondary | ICD-10-CM | POA: Diagnosis not present

## 2018-05-29 DIAGNOSIS — Z0000 Encounter for general adult medical examination without abnormal findings: Secondary | ICD-10-CM | POA: Diagnosis not present

## 2018-05-29 DIAGNOSIS — D72829 Elevated white blood cell count, unspecified: Secondary | ICD-10-CM | POA: Diagnosis not present

## 2018-05-29 DIAGNOSIS — I10 Essential (primary) hypertension: Secondary | ICD-10-CM | POA: Diagnosis not present

## 2018-06-11 ENCOUNTER — Other Ambulatory Visit (HOSPITAL_COMMUNITY): Payer: Self-pay | Admitting: Cardiology

## 2018-06-23 ENCOUNTER — Other Ambulatory Visit (HOSPITAL_COMMUNITY): Payer: Self-pay | Admitting: Cardiology

## 2018-06-30 DIAGNOSIS — R1310 Dysphagia, unspecified: Secondary | ICD-10-CM | POA: Diagnosis not present

## 2018-06-30 DIAGNOSIS — E213 Hyperparathyroidism, unspecified: Secondary | ICD-10-CM | POA: Diagnosis not present

## 2018-06-30 DIAGNOSIS — I10 Essential (primary) hypertension: Secondary | ICD-10-CM | POA: Diagnosis not present

## 2018-06-30 DIAGNOSIS — E890 Postprocedural hypothyroidism: Secondary | ICD-10-CM | POA: Diagnosis not present

## 2018-07-01 ENCOUNTER — Other Ambulatory Visit: Payer: Self-pay | Admitting: Endocrinology

## 2018-07-01 DIAGNOSIS — R1310 Dysphagia, unspecified: Secondary | ICD-10-CM

## 2018-07-03 ENCOUNTER — Ambulatory Visit
Admission: RE | Admit: 2018-07-03 | Discharge: 2018-07-03 | Disposition: A | Discharge status: Home / Self Care | Payer: Medicare HMO | Source: Ambulatory Visit | Attending: Endocrinology | Admitting: Endocrinology

## 2018-07-03 DIAGNOSIS — K219 Gastro-esophageal reflux disease without esophagitis: Secondary | ICD-10-CM | POA: Diagnosis not present

## 2018-07-03 DIAGNOSIS — R1310 Dysphagia, unspecified: Secondary | ICD-10-CM

## 2018-07-12 ENCOUNTER — Other Ambulatory Visit: Payer: Self-pay | Admitting: Cardiology

## 2018-07-30 DIAGNOSIS — J449 Chronic obstructive pulmonary disease, unspecified: Secondary | ICD-10-CM | POA: Diagnosis not present

## 2018-07-30 DIAGNOSIS — I5020 Unspecified systolic (congestive) heart failure: Secondary | ICD-10-CM | POA: Diagnosis not present

## 2018-07-30 DIAGNOSIS — R1310 Dysphagia, unspecified: Secondary | ICD-10-CM | POA: Diagnosis not present

## 2018-08-04 ENCOUNTER — Other Ambulatory Visit: Payer: Self-pay | Admitting: Gastroenterology

## 2018-08-08 ENCOUNTER — Encounter (HOSPITAL_COMMUNITY): Payer: Self-pay | Admitting: *Deleted

## 2018-08-08 ENCOUNTER — Ambulatory Visit (HOSPITAL_COMMUNITY)
Admission: RE | Admit: 2018-08-08 | Discharge: 2018-08-08 | Disposition: A | Discharge status: Home / Self Care | Payer: Medicare HMO | Source: Ambulatory Visit | Attending: Gastroenterology | Admitting: Gastroenterology

## 2018-08-08 ENCOUNTER — Ambulatory Visit (HOSPITAL_COMMUNITY): Payer: Medicare HMO | Admitting: Anesthesiology

## 2018-08-08 ENCOUNTER — Encounter (HOSPITAL_COMMUNITY)
Admission: RE | Disposition: A | Discharge status: Home / Self Care | Payer: Self-pay | Source: Ambulatory Visit | Attending: Gastroenterology

## 2018-08-08 ENCOUNTER — Other Ambulatory Visit: Payer: Self-pay

## 2018-08-08 DIAGNOSIS — Z79899 Other long term (current) drug therapy: Secondary | ICD-10-CM | POA: Diagnosis not present

## 2018-08-08 DIAGNOSIS — I110 Hypertensive heart disease with heart failure: Secondary | ICD-10-CM | POA: Insufficient documentation

## 2018-08-08 DIAGNOSIS — I509 Heart failure, unspecified: Secondary | ICD-10-CM | POA: Insufficient documentation

## 2018-08-08 DIAGNOSIS — Z95810 Presence of automatic (implantable) cardiac defibrillator: Secondary | ICD-10-CM | POA: Insufficient documentation

## 2018-08-08 DIAGNOSIS — G4730 Sleep apnea, unspecified: Secondary | ICD-10-CM | POA: Diagnosis not present

## 2018-08-08 DIAGNOSIS — E781 Pure hyperglyceridemia: Secondary | ICD-10-CM | POA: Diagnosis not present

## 2018-08-08 DIAGNOSIS — K222 Esophageal obstruction: Secondary | ICD-10-CM | POA: Diagnosis not present

## 2018-08-08 DIAGNOSIS — J449 Chronic obstructive pulmonary disease, unspecified: Secondary | ICD-10-CM | POA: Diagnosis not present

## 2018-08-08 DIAGNOSIS — E039 Hypothyroidism, unspecified: Secondary | ICD-10-CM | POA: Diagnosis not present

## 2018-08-08 DIAGNOSIS — R1310 Dysphagia, unspecified: Secondary | ICD-10-CM | POA: Diagnosis not present

## 2018-08-08 DIAGNOSIS — Z87891 Personal history of nicotine dependence: Secondary | ICD-10-CM | POA: Insufficient documentation

## 2018-08-08 DIAGNOSIS — I2510 Atherosclerotic heart disease of native coronary artery without angina pectoris: Secondary | ICD-10-CM | POA: Insufficient documentation

## 2018-08-08 HISTORY — PX: BIOPSY: SHX5522

## 2018-08-08 HISTORY — PX: ESOPHAGOGASTRODUODENOSCOPY (EGD) WITH PROPOFOL: SHX5813

## 2018-08-08 HISTORY — PX: SAVORY DILATION: SHX5439

## 2018-08-08 SURGERY — ESOPHAGOGASTRODUODENOSCOPY (EGD) WITH PROPOFOL
Anesthesia: Monitor Anesthesia Care

## 2018-08-08 MED ORDER — LIDOCAINE 2% (20 MG/ML) 5 ML SYRINGE
INTRAMUSCULAR | Status: DC | PRN
  Administered 2018-08-08: 100 mg via INTRAVENOUS

## 2018-08-08 MED ORDER — LACTATED RINGERS IV SOLN
INTRAVENOUS | Status: DC
  Administered 2018-08-08: 09:00:00 via INTRAVENOUS

## 2018-08-08 MED ORDER — PROPOFOL 10 MG/ML IV BOLUS
INTRAVENOUS | Status: DC | PRN
  Administered 2018-08-08: 60 mg via INTRAVENOUS

## 2018-08-08 MED ORDER — PROPOFOL 500 MG/50ML IV EMUL
INTRAVENOUS | Status: DC | PRN
  Administered 2018-08-08: 125 ug/kg/min via INTRAVENOUS

## 2018-08-08 MED ORDER — ONDANSETRON HCL 4 MG/2ML IJ SOLN: 4.0000 mg | Freq: Once | INTRAMUSCULAR | Status: DC | PRN

## 2018-08-08 MED ORDER — FENTANYL CITRATE (PF) 100 MCG/2ML IJ SOLN: 25.0000 ug | INTRAMUSCULAR | Status: DC | PRN

## 2018-08-08 MED ORDER — SODIUM CHLORIDE 0.9 % IV SOLN: INTRAVENOUS | Status: DC

## 2018-08-08 MED ORDER — PROPOFOL 10 MG/ML IV BOLUS
INTRAVENOUS | Status: AC
  Filled 2018-08-08: qty 40

## 2018-08-08 SURGICAL SUPPLY — 15 items

## 2018-08-08 NOTE — Discharge Instructions (Signed)

## 2018-08-08 NOTE — Anesthesia Preprocedure Evaluation (Signed)
Anesthesia Evaluation  Patient identified by MRN, date of birth, ID band Patient awake    Reviewed: Allergy & Precautions, NPO status , Patient's Chart, lab work & pertinent test results, reviewed documented beta blocker date and time   Airway Mallampati: II  TM Distance: >3 FB Neck ROM: Full    Dental  (+) Teeth Intact, Dental Advisory Given   Pulmonary sleep apnea , COPD, former smoker,    Pulmonary exam normal breath sounds clear to auscultation       Cardiovascular hypertension, Pt. on home beta blockers and Pt. on medications + angina + CAD and +CHF  Normal cardiovascular exam+ Cardiac Defibrillator  Rhythm:Regular Rate:Normal  Echo 05/19/18: Study Conclusions  - Left ventricle: The cavity size was normal. Wall thickness was increased in a pattern of mild LVH. Systolic function was normal. The estimated ejection fraction was in the range of 50% to 55%. Wall motion was normal; there were no regional wall motion abnormalities. Doppler parameters are consistent with abnormal left ventricular relaxation (grade 1 diastolic dysfunction). - Mitral valve: There was mild regurgitation. - Right atrium: The atrium was mildly dilated. - Pulmonary arteries: Systolic pressure was mildly increased. PA peak pressure: 37 mm Hg (S).   Neuro/Psych negative neurological ROS     GI/Hepatic Neg liver ROS, dysphagia   Endo/Other  Hypothyroidism Obesity   Renal/GU negative Renal ROS     Musculoskeletal  (+) Arthritis ,   Abdominal   Peds  Hematology negative hematology ROS (+)   Anesthesia Other Findings Day of surgery medications reviewed with the patient.  Reproductive/Obstetrics                            Anesthesia Physical Anesthesia Plan  ASA: III  Anesthesia Plan: MAC   Post-op Pain Management:    Induction: Intravenous  PONV Risk Score and Plan: 2 and Propofol infusion and Treatment may  vary due to age or medical condition  Airway Management Planned: Natural Airway and Simple Face Mask  Additional Equipment:   Intra-op Plan:   Post-operative Plan:   Informed Consent: I have reviewed the patients History and Physical, chart, labs and discussed the procedure including the risks, benefits and alternatives for the proposed anesthesia with the patient or authorized representative who has indicated his/her understanding and acceptance.   Dental advisory given  Plan Discussed with: CRNA and Anesthesiologist  Anesthesia Plan Comments:         Anesthesia Quick Evaluation

## 2018-08-08 NOTE — Transfer of Care (Signed)
Immediate Anesthesia Transfer of Care Note  Patient: Stacy Hernandez  Procedure(s) Performed: ESOPHAGOGASTRODUODENOSCOPY (EGD) WITH PROPOFOL (N/A ) SAVORY DILATION (N/A ) BIOPSY  Patient Location: PACU and Endoscopy Unit  Anesthesia Type:MAC  Level of Consciousness: awake, alert  and oriented  Airway & Oxygen Therapy: Patient Spontanous Breathing and Patient connected to face mask oxygen  Post-op Assessment: Report given to RN and Post -op Vital signs reviewed and stable  Post vital signs: Reviewed and stable  Last Vitals:  Vitals Value Taken Time  BP    Temp    Pulse 62 08/08/2018  9:17 AM  Resp 20 08/08/2018  9:19 AM  SpO2 94 % 08/08/2018  9:17 AM  Vitals shown include unvalidated device data.  Last Pain:  Vitals:   08/08/18 0910  TempSrc:   PainSc: 0-No pain         Complications: No apparent anesthesia complications

## 2018-08-08 NOTE — H&P (Signed)
[place] HPI: The patient reports issues with dysphagia that started one year ago. Over this time her symptoms have progressively worsened. She states that it was intermittent, but now any solid PO intake will result in dysphagia. There is no associated weight loss or vomiting with the symptoms. She believes that she had an EGD 15 years ago with [facility], but she cannot recall the reason. The patient denies any problems with GERD. There is a history of CHF and she has an AICD/pacemaker in place. The only anticoagulation that she uses is ASA 81 mg. She has a 20 year history of smoking, but she quit 3 years ago. She has a history of CHF and she is s/o AICD/pacemaker placement. She was last evaluated by Dr. Lovena Le and she appeared to be stable. She was scheduled to follow up with Dr. Lovena Le in one year. Her last EF on 05/19/2018 was at 50-55%.   Past Medical History:  Diagnosis Date  . AICD (automatic cardioverter/defibrillator) present 04/15/2015   Medtronic (serial Number [id number]) biventricular ICD  . Back pain    Recent positive ANA but told she has OA  . CAD (coronary artery disease)    a.  per cath in June 2013 with nonobstructive CAD;  b.  Lexiscan Myoview (12/15):  No ischemia.  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 Cardiomyopathy ([place])    a.  EF 45-50% by echo 04/23/2012;  b.  Echo (12/15):  EF 10-15%, Gr 2 DD, ventricular septum dyssynergy, mod MR, mod LAE.  Marland Kitchen COPD (chronic obstructive pulmonary disease) ([place])   . Hypercalcemia    Due to HCTZ, improved with d/c of calcium supplements  . Hypertension   . Hypertriglyceridemia   . Nephrolithiasis   . OA (osteoarthritis)   . Osteoporosis   . Sleep apnea   . Thyroid disease    Post-ablation hypothyroidism  . Tobacco abuse     Past Surgical History:  Procedure Laterality Date  . BLADDER SURGERY     bladder & bowel tac with mesh  . CARPAL TUNNEL RELEASE     . CESAREAN SECTION    . EP IMPLANTABLE DEVICE N/A 04/15/2015   Procedure: BiV ICD Insertion CRT-D;  Surgeon: Evans Lance, MD;  Medtronic (serial Number [id number]) biventricular ICD  . LEFT AND RIGHT HEART CATHETERIZATION WITH CORONARY ANGIOGRAM N/A 11/12/2014   Procedure: LEFT AND RIGHT HEART CATHETERIZATION WITH CORONARY ANGIOGRAM;  Surgeon: Larey Dresser, MD;  Location: [facility] CATH LAB;  Service: Cardiovascular;  Laterality: N/A;  . LEFT HEART CATHETERIZATION WITH CORONARY ANGIOGRAM N/A 04/22/2012   Procedure: LEFT HEART CATHETERIZATION WITH CORONARY ANGIOGRAM;  Surgeon: Hillary Bow, MD;  Location: [facility] CATH LAB;  Service: Cardiovascular;  Laterality: N/A;  . PARTIAL HYSTERECTOMY     Due to cervical dysplasia  . TONSILLECTOMY      Family History  Problem Relation Age of Onset  . Coronary artery disease Father        s/p CABG in his 64s  . Hypertension Father   . Heart attack Father   . COPD Mother        smoked  . Colon polyps Mother   . Colon cancer Maternal Uncle 78  . Diabetes Sister   . Cirrhosis Sister   . Stroke Neg Hx     Social History:  reports that she quit smoking about 3 years ago. Her smoking use included cigarettes. She has a 70.00  pack-year smoking history. She has never used smokeless tobacco. She reports that she does not drink alcohol or use drugs.  Allergies:  Allergies  Allergen Reactions  . Prednisone Nausea Only    Medications:  Scheduled:  Continuous: . lactated ringers      No results found for this or any previous visit (from the past 24 hour(s)).   No results found.  ROS:  As stated above in the HPI otherwise negative.  Blood pressure (!) 124/58, pulse 66, temperature 98.1 F (36.7 C), temperature source Oral, resp. rate 18, height 5\' 2"  (1.575 m), weight 87.1 kg, SpO2 94 %.    PE: Gen: NAD, Alert and Oriented HEENT:  [place]/AT, EOMI Neck: Supple, no LAD Lungs: CTA Bilaterally CV: RRR without M/G/R ABM: Soft, NTND, +BS Ext: No  C/C/E  Assessment/Plan: 1) Dysphagia - EGD with dilation.  Adiel Mcnamara D 08/08/2018, 8:30 AM

## 2018-08-08 NOTE — Anesthesia Postprocedure Evaluation (Signed)
Anesthesia Post Note  Patient: Stacy Hernandez  Procedure(s) Performed: ESOPHAGOGASTRODUODENOSCOPY (EGD) WITH PROPOFOL (N/A ) SAVORY DILATION (N/A ) BIOPSY     Patient location during evaluation: PACU Anesthesia Type: MAC Level of consciousness: awake and alert Pain management: pain level controlled Vital Signs Assessment: post-procedure vital signs reviewed and stable Respiratory status: spontaneous breathing, nonlabored ventilation and respiratory function stable Cardiovascular status: stable and blood pressure returned to baseline Postop Assessment: no apparent nausea or vomiting Anesthetic complications: no    Last Vitals:  Vitals:   08/08/18 0904 08/08/18 0910  BP: (!) 93/51 (!) 107/51  Pulse: 68 63  Resp: (!) 23 (!) 21  Temp: 36.5 C   SpO2: 93% 96%    Last Pain:  Vitals:   08/08/18 0910  TempSrc:   PainSc: 0-No pain                 Catalina Gravel

## 2018-08-08 NOTE — Op Note (Signed)
[facility] Patient Name: Stacy Hernandez Procedure Date: 08/08/2018 MRN: [id number] Attending MD: Carol Ada , MD Date of Birth: 1956/07/27 CSN: [id number] Age: 62 Admit Type: Outpatient Procedure:                Upper GI endoscopy Indications:              Dysphagia Providers:                Carol Ada, MD, Elmer Ramp. Tilden Dome, RN, Charolette Child, Technician, Stephanie [place] CRNA Referring MD:              Medicines:                Propofol per Anesthesia Complications:            No immediate complications. Estimated Blood Loss:     Estimated blood loss was minimal. Procedure:                Pre-Anesthesia Assessment:                           - Prior to the procedure, a History and Physical                            was performed, and patient medications and                            allergies were reviewed. The patient's tolerance of                            previous anesthesia was also reviewed. The risks                            and benefits of the procedure and the sedation                            options and risks were discussed with the patient.                            All questions were answered, and informed consent                            was obtained. Prior Anticoagulants: The patient has                            taken no previous anticoagulant or antiplatelet                            agents. ASA Grade Assessment: III - A patient with                            severe systemic disease. After reviewing the risks  and benefits, the patient was deemed in                            satisfactory condition to undergo the procedure.                           - Sedation was administered by an anesthesia                            professional. Deep sedation was attained.                           After obtaining informed consent, the endoscope was                            passed under  direct vision. Throughout the                            procedure, the patient's blood pressure, pulse, and                            oxygen saturations were monitored continuously. The                            GIF-H190 ([id number] Olympus adult endoscope was                            introduced through the mouth, and advanced to the                            second part of duodenum. The upper GI endoscopy was                            accomplished without difficulty. The patient                            tolerated the procedure well. Scope In: Scope Out: Findings:      No endoscopic abnormality was evident in the esophagus to explain the       patient's complaint of dysphagia. It was decided, however, to proceed       with dilation of the entire esophagus. A guidewire was placed and the       scope was withdrawn. Dilation was performed with a Savary dilator with       no resistance at 18 mm. The dilation site was examined following       endoscope reinsertion and showed no change. This was biopsied with a       cold forceps for histology.      The stomach was normal.      The examined duodenum was normal. Impression:               - No endoscopic esophageal abnormality to explain                            patient's dysphagia. Esophagus dilated. Dilated.  Biopsied.                           - Normal stomach.                           - Normal examined duodenum. Moderate Sedation:      N/A- Per Anesthesia Care Recommendation:           - Patient has a contact number available for                            emergencies. The signs and symptoms of potential                            delayed complications were discussed with the                            patient. Return to normal activities tomorrow.                            Written discharge instructions were provided to the                            patient.                           - Resume  previous diet.                           - Continue present medications.                           - Await pathology results.                           - Return to GI clinic in 4 weeks. Procedure Code(s):        --- Professional ---                           [phone], Esophagogastroduodenoscopy, flexible,                            transoral; with insertion of guide wire followed by                            passage of dilator(s) through esophagus over guide                            wire                           [id number], Esophagogastroduodenoscopy, flexible,                            transoral; with biopsy, single or multiple Diagnosis Code(s):        --- Professional ---  R13.10, Dysphagia, unspecified CPT copyright 2017 American Medical Association. All rights reserved. The codes documented in this report are preliminary and upon coder review may  be revised to meet current compliance requirements. Carol Ada, MD Carol Ada, MD 08/08/2018 9:10:35 AM This report has been signed electronically. Number of Addenda: 0

## 2018-08-11 ENCOUNTER — Encounter (HOSPITAL_COMMUNITY): Payer: Self-pay | Admitting: Gastroenterology

## 2018-08-11 NOTE — Addendum Note (Signed)
Addendum  created 08/11/18 0034 by Lollie Sails, CRNA   Charge Capture section accepted

## 2018-08-11 NOTE — Addendum Note (Signed)
Addendum  created 08/11/18 3074 by Lollie Sails, CRNA   Charge Capture section accepted

## 2018-08-14 ENCOUNTER — Other Ambulatory Visit (HOSPITAL_COMMUNITY): Payer: Self-pay | Admitting: Cardiology

## 2018-08-19 ENCOUNTER — Ambulatory Visit (INDEPENDENT_AMBULATORY_CARE_PROVIDER_SITE_OTHER): Payer: Medicare HMO | Admitting: *Deleted

## 2018-08-19 DIAGNOSIS — I428 Other cardiomyopathies: Secondary | ICD-10-CM

## 2018-08-20 NOTE — Progress Notes (Signed)
Remote ICD transmission.   

## 2018-08-21 ENCOUNTER — Encounter (HOSPITAL_COMMUNITY): Payer: Self-pay | Admitting: Cardiology

## 2018-08-21 ENCOUNTER — Ambulatory Visit (HOSPITAL_COMMUNITY)
Admission: RE | Admit: 2018-08-21 | Discharge: 2018-08-21 | Disposition: A | Discharge status: Home / Self Care | Payer: Medicare HMO | Source: Ambulatory Visit | Attending: Cardiology | Admitting: Cardiology

## 2018-08-21 ENCOUNTER — Other Ambulatory Visit: Payer: Self-pay

## 2018-08-21 VITALS — BP 126/75 | HR 70 | Wt 191.4 lb

## 2018-08-21 DIAGNOSIS — E669 Obesity, unspecified: Secondary | ICD-10-CM | POA: Insufficient documentation

## 2018-08-21 DIAGNOSIS — I10 Essential (primary) hypertension: Secondary | ICD-10-CM | POA: Diagnosis not present

## 2018-08-21 DIAGNOSIS — Z7982 Long term (current) use of aspirin: Secondary | ICD-10-CM | POA: Diagnosis not present

## 2018-08-21 DIAGNOSIS — I452 Bifascicular block: Secondary | ICD-10-CM | POA: Insufficient documentation

## 2018-08-21 DIAGNOSIS — E785 Hyperlipidemia, unspecified: Secondary | ICD-10-CM | POA: Diagnosis not present

## 2018-08-21 DIAGNOSIS — I252 Old myocardial infarction: Secondary | ICD-10-CM | POA: Insufficient documentation

## 2018-08-21 DIAGNOSIS — J449 Chronic obstructive pulmonary disease, unspecified: Secondary | ICD-10-CM | POA: Diagnosis not present

## 2018-08-21 DIAGNOSIS — Z6835 Body mass index (BMI) 35.0-35.9, adult: Secondary | ICD-10-CM | POA: Diagnosis not present

## 2018-08-21 DIAGNOSIS — Z79899 Other long term (current) drug therapy: Secondary | ICD-10-CM | POA: Insufficient documentation

## 2018-08-21 DIAGNOSIS — R69 Illness, unspecified: Secondary | ICD-10-CM | POA: Diagnosis not present

## 2018-08-21 DIAGNOSIS — I5022 Chronic systolic (congestive) heart failure: Secondary | ICD-10-CM | POA: Diagnosis not present

## 2018-08-21 DIAGNOSIS — G4733 Obstructive sleep apnea (adult) (pediatric): Secondary | ICD-10-CM | POA: Insufficient documentation

## 2018-08-21 DIAGNOSIS — I428 Other cardiomyopathies: Secondary | ICD-10-CM | POA: Insufficient documentation

## 2018-08-21 DIAGNOSIS — E039 Hypothyroidism, unspecified: Secondary | ICD-10-CM | POA: Insufficient documentation

## 2018-08-21 DIAGNOSIS — Z87891 Personal history of nicotine dependence: Secondary | ICD-10-CM | POA: Diagnosis not present

## 2018-08-21 LAB — BASIC METABOLIC PANEL
Anion gap: 11 (ref 5–15)
BUN: 11 mg/dL (ref 8–23)
CO2: 27 mmol/L (ref 22–32)
Calcium: 10.1 mg/dL (ref 8.9–10.3)
Chloride: 106 mmol/L (ref 98–111)
Creatinine, Ser: 1.31 mg/dL — ABNORMAL HIGH (ref 0.44–1.00)
GFR calc Af Amer: 50 mL/min — ABNORMAL LOW (ref >60)
GFR calc non Af Amer: 43 mL/min — ABNORMAL LOW (ref >60)
Glucose, Bld: 102 mg/dL — ABNORMAL HIGH (ref 70–99)
Potassium: 4.2 mmol/L (ref 3.5–5.1)
Sodium: 144 mmol/L (ref 135–145)

## 2018-08-21 LAB — LIPID PANEL
Cholesterol: 150 mg/dL (ref 0–200)
HDL: 35 mg/dL — ABNORMAL LOW (ref >40)
LDL Cholesterol: 70 mg/dL (ref 0–99)
Total CHOL/HDL Ratio: 4.3 RATIO
Triglycerides: 226 mg/dL — ABNORMAL HIGH (ref <150)
VLDL: 45 mg/dL — ABNORMAL HIGH (ref 0–40)

## 2018-08-21 NOTE — Patient Instructions (Signed)
Labs today  Labs in 3 months  We will contact you in 6 months to schedule your next appointment.

## 2018-08-21 NOTE — Progress Notes (Signed)
Patient ID: Stacy Hernandez, female   DOB: [birth year], 62 y.o.   MRN: [id number] PCP: Dr. Shelia Media EP: Dr Lovena Le Cardiology: Dr Aundra Dubin   62 y.o. with history of HTN, intermittent LBBB, and nonischemic cardiomyopathy presents for cardiology followup.  She was admitted with chest pain in 6/13.  ECG showed intermittent LBBB.  Echo showed EF 45-50%.  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    Developed CP and dyspnea. She was set up for echo and Cardiolite in 12/15.  Echo showed EF 10-15% (significant fall).  She had a Lexiscan Cardiolite with EF 25% and apical anteroseptal and apical fixed perfusion defect.  Therefore, she was sent for left and right heart catheterization in 1/16.  This showed mild elevation in LV and RV filling pressures and no significant CAD.  She was started on Lasix 20 mg daily.  Echo in 5/16 showed EF 20-25%, mildly dilated and mildly dysfunctional RV.  PFTs 4/16 showed severe obstruction and restriction.  She has seen Dr Melvyn Novas.  She is noted to have an elevated right hemidiaphragm.   In 6/16, she had Medtronic CRT-D system placed.  In 7/16, CPX showed low normal functional capacity with the main limitation being pulmonary.  Repeat echo in 11/16 showed improvement in EF to 40-45%.  Echo in 7/18 showed stable EF 40-45%.  Echo in 7/19 showed EF up to 50-55%.   She returns today for CHF followup.  Weight is stable.  She is doing well overall, no significant exertional dyspnea.  No chest pain. No orthopnea/PND.  No lightheadedness.  Taking all meds as ordered.    ECG (personally reviewed): NSR, RBBB    Medtronic device interrogation: stable thoracic impedance.    Labs (12/13): LDL 80, HDL 46, K 4, creatinine 0.7 Labs (6/14): K 4, creatinine 0.8, LDL 73, HDL 45 Labs (12/15): K 4.2, creatinine 0.7, HCT 43.3, LDL 60, HDL 51, BNP 577, TSH normal Labs (2/16): K 4.2, creatinine 0.7 Labs (1/16): SPEP no M spike K 4.3 Creatinine 0.74  Labs (2/16): K 4.2, creatinine 0.78 Labs (02/23/2015) :  K 4.2 Creatinine 0.90, BNP 339 Labs (04/13/2015): K 4.5 Creatinine 1.01  Labs (05/17/2015): K 4.7 Creatinine 0.71  Labs (11/16): K 4.1, creatinine 0.74 Labs (6/17): K 4.2, creatinine 0.85, BNP 76 Labs (1/18): LDL 55, HDL 42 Labs (7/18): K 3.9, creatinine 0.91 Labs (1/19): K 4, creatinine 1.0, LDL 59, hgb 13.7  PMH: 1. HTN 2. Hyperlipidemia 3. COPD: Prior smoker.  PFTs (4/16) with FVC 37%, FEV1 36%, ratio 97%, TLC 67%, DLCO 32% => severe obstruction and restriction.  COPD + elevated right hemidiaphragm.  4. Intermittent LBBB/IVCD 5. Hypothyroidism 6. Cardiomyopathy: Nonischemic.  Echo (6/13) with EF 45-50%, paradoxical septal motion.  LHC (6/13) with 40% D1 stenosis, 30% PLV stenosis.  Echo (12/15) with EF 10-15%, moderately dilated LV, diffuse hypokinesis, moderate MR.   Lexiscan Cardiolite (12/15) with EF 25%, diffuse hypokinesis, apical anteroseptal and apical fixed defect. LHC/RHC (1/16) with mean RA 8, PA 37/19, mean PCWP 19, CI 2.63, EF 30%, no significant CAD.  Cardiac MRI 12/20/2014: EF 24% Mod mitral regurg thought to be from dilated LV, normal RV size, no LGE.  Echo (5/16) with EF 20-25%, mildly dilated and mildly dysfunctional RV, mild MR. Medtronic CRT-D placed 6/16.  CPX (7/16) with peak VO2 16.3, VE/VCO2 slope 25, RER 1.1, low normal functional capacity limited primarily by lung disease.  - Echo (11/16) with EF 40-45%, mild MR.  - Echo (7/18): EF 40-45%, diffuse hypokinesis, grade II  diastolic dysfunction.  - Echo (7/19): EF 50-55%, mild MR, PASP 37 mmHg.  7. 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  8. OSA: 04/18/2015 Sleep Study. She has not been able to tolerate CPAP. 9. 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  Quit smoking in 1/16.    FH: Father CABG at 70   ROS: All systems reviewed and negative except as per HPI.   Current Outpatient Medications  Medication Sig  Dispense Refill  . aspirin 81 MG tablet Take 81 mg by mouth at bedtime.     Marland Kitchen atorvastatin (LIPITOR) 10 MG tablet TAKE 1 TABLET BY MOUTH EVERYDAY AT BEDTIME 30 tablet 6  . carvedilol (COREG) 12.5 MG tablet TAKE 1 TABLET (12.5 MG TOTAL) BY MOUTH 2 (TWO) TIMES DAILY. 180 tablet 1  . cholecalciferol (VITAMIN D) 400 units TABS tablet Take 400 Units by mouth daily.    Marland Kitchen ENTRESTO 97-103 MG TAKE 1 TABLET BY MOUTH TWICE A DAY 60 tablet 9  . furosemide (LASIX) 20 MG tablet TAKE 1 TABLET BY MOUTH TWICE A DAY 60 tablet 11  . levothyroxine (SYNTHROID, LEVOTHROID) 100 MCG tablet Take 100 mcg by mouth daily before breakfast.    . Multiple Vitamins-Minerals (MULTIVITAMIN WITH MINERALS) tablet Take 1 tablet by mouth daily.    . niacin 500 MG tablet Take 1,500 mg by mouth at bedtime.    . nitroGLYCERIN (NITROSTAT) 0.4 MG SL tablet Place 1 tablet (0.4 mg total) under the tongue every 5 (five) minutes as needed for chest pain. 25 tablet 3  . Phenylephrine-APAP-guaiFENesin (MUCINEX SINUS-MAX PO) Take 1 tablet by mouth daily as needed (sinuses).    . potassium chloride (K-DUR) 10 MEQ tablet TAKE 1 TABLET BY MOUTH EVERY DAY 30 tablet 5  . spironolactone (ALDACTONE) 25 MG tablet TAKE 1 TABLET BY MOUTH EVERY DAY 30 tablet 3  . traMADol (ULTRAM) 50 MG tablet Take 50 mg by mouth every evening.     No current facility-administered medications for this encounter.     BP 126/75   Pulse 70   Wt 86.8 kg (191 lb 6.4 oz)   SpO2 100%   BMI 35.01 kg/m  General: NAD Neck: No JVD, no thyromegaly or thyroid nodule.  Lungs: Clear to auscultation bilaterally with normal respiratory effort. CV: Nondisplaced PMI.  Heart regular S1/S2, no S3/S4, no murmur.  No peripheral edema.  No carotid bruit.  Normal pedal pulses.  Abdomen: Soft, nontender, no hepatosplenomegaly, no distention.  Skin: Intact without lesions or rashes.  Neurologic: Alert and oriented x 3.  Psych: Normal affect. Extremities: No clubbing or cyanosis.   HEENT: Normal.    Assessment/Plan: 1. Cardiomyopathy: Nonischemic cardiomyopathy. In the past, she had EF 45-50% with no significant CAD (2013).  LBBB-related nonischemic cardiomyopathy.  However, in the fall of 2015 she developed exertional chest pain and dyspnea.  Echo and Cardiolite showed marked worsening of her LV systolic function (EF 61-95% by echo) and Cardiolite suggested possible prior MI.  Left heart cath in 1/16 showed no significant CAD.  RHC showed mildly elevated right and left heart filling pressures and normal CI. SPEP negative and no infiltrative disease on cardiac MRI. Repeat echo in 5/16 with EF 20-25%.  CPX showed limitations are related to lung disease as well as obesity, no HF limitation. Medtronic CRT-D placement.  Followup echo in 11/16 showed improvement in EF to 40-45%.  Most recent echo in  7/19 showed EF 50-55%.  She is not volume overloaded today by exam.  NYHA class I-II symptoms. - Continue Lasix 20 mg bid.  BMET today.  - Continue Coreg 12.5 mg bid.   - Continue Entresto to 97/103 bid.   - Continue spironolactone 25 mg daily.     2. Elevated right hemidiaphragm: This likely contributes to dyspnea.   3. OSA:  Has not been able to tolerate CPAP.    4. COPD: Severe by PFTs. No longer smokes.  5. Hyperlipidemia: Check lipids today.   She will need BMET in 3 months and followup in 6 months.   Loralie Champagne 08/21/2018

## 2018-09-02 ENCOUNTER — Other Ambulatory Visit: Payer: Self-pay | Admitting: Cardiology

## 2018-09-03 DIAGNOSIS — I5020 Unspecified systolic (congestive) heart failure: Secondary | ICD-10-CM | POA: Diagnosis not present

## 2018-09-03 DIAGNOSIS — R1310 Dysphagia, unspecified: Secondary | ICD-10-CM | POA: Diagnosis not present

## 2018-09-04 ENCOUNTER — Encounter (HOSPITAL_COMMUNITY): Payer: Medicare HMO | Admitting: Cardiology

## 2018-09-11 ENCOUNTER — Other Ambulatory Visit: Payer: Self-pay | Admitting: Internal Medicine

## 2018-09-11 DIAGNOSIS — Z1231 Encounter for screening mammogram for malignant neoplasm of breast: Secondary | ICD-10-CM

## 2018-09-12 LAB — CUP PACEART REMOTE DEVICE CHECK
Battery Remaining Longevity: 65 mo
Battery Voltage: 2.99 V
Brady Statistic AP VP Percent: 6.51 %
Brady Statistic AP VS Percent: 0.12 %
Brady Statistic AS VP Percent: 92.11 %
Brady Statistic AS VS Percent: 1.27 %
Brady Statistic RA Percent Paced: 6.62 %
Brady Statistic RV Percent Paced: 97.68 %
Date Time Interrogation Session: 20191015084223
HighPow Impedance: 64 Ohm
Implantable Lead Implant Date: 20160610
Implantable Lead Implant Date: 20160610
Implantable Lead Implant Date: 20160610
Implantable Lead Location: 753858
Implantable Lead Location: 753859
Implantable Lead Location: 753860
Implantable Lead Model: 4396
Implantable Lead Model: 5076
Implantable Pulse Generator Implant Date: 20160610
Lead Channel Impedance Value: 361 Ohm
Lead Channel Impedance Value: 361 Ohm
Lead Channel Impedance Value: 456 Ohm
Lead Channel Impedance Value: 475 Ohm
Lead Channel Impedance Value: 551 Ohm
Lead Channel Impedance Value: 703 Ohm
Lead Channel Pacing Threshold Amplitude: 0.375 V
Lead Channel Pacing Threshold Amplitude: 0.5 V
Lead Channel Pacing Threshold Amplitude: 1 V
Lead Channel Pacing Threshold Pulse Width: 0.4 ms
Lead Channel Pacing Threshold Pulse Width: 0.4 ms
Lead Channel Pacing Threshold Pulse Width: 0.4 ms
Lead Channel Sensing Intrinsic Amplitude: 16.5 mV
Lead Channel Sensing Intrinsic Amplitude: 16.5 mV
Lead Channel Sensing Intrinsic Amplitude: 2.25 mV
Lead Channel Sensing Intrinsic Amplitude: 2.25 mV
Lead Channel Setting Pacing Amplitude: 1.5 V
Lead Channel Setting Pacing Amplitude: 2 V
Lead Channel Setting Pacing Amplitude: 2 V
Lead Channel Setting Pacing Pulse Width: 0.4 ms
Lead Channel Setting Pacing Pulse Width: 0.4 ms
Lead Channel Setting Sensing Sensitivity: 0.3 mV

## 2018-09-15 ENCOUNTER — Other Ambulatory Visit (HOSPITAL_COMMUNITY): Payer: Self-pay | Admitting: Cardiology

## 2018-10-16 ENCOUNTER — Other Ambulatory Visit: Payer: Self-pay | Admitting: Cardiology

## 2018-10-22 ENCOUNTER — Ambulatory Visit
Admission: RE | Admit: 2018-10-22 | Discharge: 2018-10-22 | Disposition: A | Discharge status: Home / Self Care | Payer: Medicare HMO | Source: Ambulatory Visit | Attending: Internal Medicine | Admitting: Internal Medicine

## 2018-10-22 DIAGNOSIS — Z1231 Encounter for screening mammogram for malignant neoplasm of breast: Secondary | ICD-10-CM | POA: Diagnosis not present

## 2018-11-10 DIAGNOSIS — Z87442 Personal history of urinary calculi: Secondary | ICD-10-CM | POA: Diagnosis not present

## 2018-11-10 DIAGNOSIS — R310 Gross hematuria: Secondary | ICD-10-CM | POA: Diagnosis not present

## 2018-11-10 DIAGNOSIS — I10 Essential (primary) hypertension: Secondary | ICD-10-CM | POA: Diagnosis not present

## 2018-11-18 ENCOUNTER — Ambulatory Visit (INDEPENDENT_AMBULATORY_CARE_PROVIDER_SITE_OTHER): Payer: Medicare HMO

## 2018-11-18 DIAGNOSIS — I5022 Chronic systolic (congestive) heart failure: Secondary | ICD-10-CM

## 2018-11-18 DIAGNOSIS — I428 Other cardiomyopathies: Secondary | ICD-10-CM

## 2018-11-19 NOTE — Progress Notes (Signed)
Remote ICD transmission.   

## 2018-11-20 DIAGNOSIS — M5136 Other intervertebral disc degeneration, lumbar region: Secondary | ICD-10-CM | POA: Diagnosis not present

## 2018-11-20 DIAGNOSIS — M150 Primary generalized (osteo)arthritis: Secondary | ICD-10-CM | POA: Diagnosis not present

## 2018-11-20 DIAGNOSIS — I429 Cardiomyopathy, unspecified: Secondary | ICD-10-CM | POA: Diagnosis not present

## 2018-11-20 DIAGNOSIS — M1990 Unspecified osteoarthritis, unspecified site: Secondary | ICD-10-CM | POA: Diagnosis not present

## 2018-11-20 DIAGNOSIS — E7800 Pure hypercholesterolemia, unspecified: Secondary | ICD-10-CM | POA: Diagnosis not present

## 2018-11-21 ENCOUNTER — Ambulatory Visit (HOSPITAL_COMMUNITY)
Admission: RE | Admit: 2018-11-21 | Discharge: 2018-11-21 | Disposition: A | Discharge status: Home / Self Care | Payer: Medicare HMO | Source: Ambulatory Visit | Attending: Internal Medicine | Admitting: Internal Medicine

## 2018-11-21 DIAGNOSIS — I5022 Chronic systolic (congestive) heart failure: Secondary | ICD-10-CM

## 2018-11-21 LAB — CUP PACEART REMOTE DEVICE CHECK
Battery Remaining Longevity: 62 mo
Battery Voltage: 2.98 V
Brady Statistic AP VP Percent: 8.98 %
Brady Statistic AP VS Percent: 0.15 %
Brady Statistic AS VP Percent: 89.55 %
Brady Statistic AS VS Percent: 1.32 %
Brady Statistic RA Percent Paced: 9.1 %
Brady Statistic RV Percent Paced: 97.53 %
Date Time Interrogation Session: 20200114094223
HighPow Impedance: 71 Ohm
Implantable Lead Implant Date: 20160610
Implantable Lead Implant Date: 20160610
Implantable Lead Implant Date: 20160610
Implantable Lead Location: 753858
Implantable Lead Location: 753859
Implantable Lead Location: 753860
Implantable Lead Model: 4396
Implantable Lead Model: 5076
Implantable Pulse Generator Implant Date: 20160610
Lead Channel Impedance Value: 361 Ohm
Lead Channel Impedance Value: 399 Ohm
Lead Channel Impedance Value: 456 Ohm
Lead Channel Impedance Value: 551 Ohm
Lead Channel Impedance Value: 608 Ohm
Lead Channel Impedance Value: 703 Ohm
Lead Channel Pacing Threshold Amplitude: 0.375 V
Lead Channel Pacing Threshold Amplitude: 0.375 V
Lead Channel Pacing Threshold Amplitude: 1 V
Lead Channel Pacing Threshold Pulse Width: 0.4 ms
Lead Channel Pacing Threshold Pulse Width: 0.4 ms
Lead Channel Pacing Threshold Pulse Width: 0.4 ms
Lead Channel Sensing Intrinsic Amplitude: 14.75 mV
Lead Channel Sensing Intrinsic Amplitude: 14.75 mV
Lead Channel Sensing Intrinsic Amplitude: 3.375 mV
Lead Channel Sensing Intrinsic Amplitude: 3.375 mV
Lead Channel Setting Pacing Amplitude: 1.5 V
Lead Channel Setting Pacing Amplitude: 2 V
Lead Channel Setting Pacing Amplitude: 2 V
Lead Channel Setting Pacing Pulse Width: 0.4 ms
Lead Channel Setting Pacing Pulse Width: 0.4 ms
Lead Channel Setting Sensing Sensitivity: 0.3 mV

## 2018-11-21 LAB — BASIC METABOLIC PANEL
Anion gap: 8 (ref 5–15)
BUN: 16 mg/dL (ref 8–23)
CO2: 25 mmol/L (ref 22–32)
Calcium: 9.5 mg/dL (ref 8.9–10.3)
Chloride: 106 mmol/L (ref 98–111)
Creatinine, Ser: 0.96 mg/dL (ref 0.44–1.00)
GFR calc Af Amer: >60 mL/min (ref >60)
GFR calc non Af Amer: >60 mL/min (ref >60)
Glucose, Bld: 98 mg/dL (ref 70–99)
Potassium: 4.2 mmol/L (ref 3.5–5.1)
Sodium: 139 mmol/L (ref 135–145)

## 2018-11-28 DIAGNOSIS — E890 Postprocedural hypothyroidism: Secondary | ICD-10-CM | POA: Diagnosis not present

## 2018-11-28 DIAGNOSIS — I10 Essential (primary) hypertension: Secondary | ICD-10-CM | POA: Diagnosis not present

## 2018-11-28 DIAGNOSIS — M5136 Other intervertebral disc degeneration, lumbar region: Secondary | ICD-10-CM | POA: Diagnosis not present

## 2018-11-28 DIAGNOSIS — E7800 Pure hypercholesterolemia, unspecified: Secondary | ICD-10-CM | POA: Diagnosis not present

## 2018-12-11 ENCOUNTER — Other Ambulatory Visit: Payer: Self-pay | Admitting: Cardiology

## 2018-12-18 DIAGNOSIS — I10 Essential (primary) hypertension: Secondary | ICD-10-CM | POA: Diagnosis not present

## 2018-12-18 DIAGNOSIS — D3132 Benign neoplasm of left choroid: Secondary | ICD-10-CM | POA: Diagnosis not present

## 2018-12-18 DIAGNOSIS — H2513 Age-related nuclear cataract, bilateral: Secondary | ICD-10-CM | POA: Diagnosis not present

## 2018-12-18 DIAGNOSIS — H11153 Pinguecula, bilateral: Secondary | ICD-10-CM | POA: Diagnosis not present

## 2018-12-18 DIAGNOSIS — H5210 Myopia, unspecified eye: Secondary | ICD-10-CM | POA: Diagnosis not present

## 2019-02-04 ENCOUNTER — Other Ambulatory Visit (HOSPITAL_COMMUNITY): Payer: Self-pay | Admitting: Cardiology

## 2019-02-11 ENCOUNTER — Other Ambulatory Visit: Payer: Self-pay | Admitting: Internal Medicine

## 2019-02-17 ENCOUNTER — Other Ambulatory Visit: Payer: Self-pay

## 2019-02-17 ENCOUNTER — Ambulatory Visit (INDEPENDENT_AMBULATORY_CARE_PROVIDER_SITE_OTHER): Payer: Medicare HMO | Admitting: *Deleted

## 2019-02-17 DIAGNOSIS — I428 Other cardiomyopathies: Secondary | ICD-10-CM | POA: Diagnosis not present

## 2019-02-17 LAB — CUP PACEART REMOTE DEVICE CHECK
Battery Remaining Longevity: 57 mo
Battery Voltage: 2.98 V
Brady Statistic AP VP Percent: 11.76 %
Brady Statistic AP VS Percent: 0.2 %
Brady Statistic AS VP Percent: 86.69 %
Brady Statistic AS VS Percent: 1.35 %
Brady Statistic RA Percent Paced: 11.89 %
Brady Statistic RV Percent Paced: 96.07 %
Date Time Interrogation Session: 20200414083622
HighPow Impedance: 66 Ohm
Implantable Lead Implant Date: 20160610
Implantable Lead Implant Date: 20160610
Implantable Lead Implant Date: 20160610
Implantable Lead Location: 753858
Implantable Lead Location: 753859
Implantable Lead Location: 753860
Implantable Lead Model: 4396
Implantable Lead Model: 5076
Implantable Pulse Generator Implant Date: 20160610
Lead Channel Impedance Value: 361 Ohm
Lead Channel Impedance Value: 399 Ohm
Lead Channel Impedance Value: 456 Ohm
Lead Channel Impedance Value: 513 Ohm
Lead Channel Impedance Value: 551 Ohm
Lead Channel Impedance Value: 665 Ohm
Lead Channel Pacing Threshold Amplitude: 0.5 V
Lead Channel Pacing Threshold Amplitude: 0.5 V
Lead Channel Pacing Threshold Amplitude: 1.125 V
Lead Channel Pacing Threshold Pulse Width: 0.4 ms
Lead Channel Pacing Threshold Pulse Width: 0.4 ms
Lead Channel Pacing Threshold Pulse Width: 0.4 ms
Lead Channel Sensing Intrinsic Amplitude: 15.5 mV
Lead Channel Sensing Intrinsic Amplitude: 15.5 mV
Lead Channel Sensing Intrinsic Amplitude: 2 mV
Lead Channel Sensing Intrinsic Amplitude: 2 mV
Lead Channel Setting Pacing Amplitude: 1.5 V
Lead Channel Setting Pacing Amplitude: 2 V
Lead Channel Setting Pacing Amplitude: 2.25 V
Lead Channel Setting Pacing Pulse Width: 0.4 ms
Lead Channel Setting Pacing Pulse Width: 0.4 ms
Lead Channel Setting Sensing Sensitivity: 0.3 mV

## 2019-02-24 ENCOUNTER — Encounter: Payer: Self-pay | Admitting: Cardiology

## 2019-02-24 NOTE — Progress Notes (Signed)
Remote ICD transmission.   

## 2019-03-04 ENCOUNTER — Ambulatory Visit (HOSPITAL_COMMUNITY)
Admission: RE | Admit: 2019-03-04 | Discharge: 2019-03-04 | Disposition: A | Discharge status: Home / Self Care | Payer: Medicare HMO | Source: Ambulatory Visit | Attending: Cardiology | Admitting: Cardiology

## 2019-03-04 ENCOUNTER — Other Ambulatory Visit: Payer: Self-pay

## 2019-03-04 DIAGNOSIS — I5022 Chronic systolic (congestive) heart failure: Secondary | ICD-10-CM

## 2019-03-04 NOTE — Progress Notes (Signed)
Heart Failure TeleHealth Note  Due to national recommendations of social distancing due to [place] 19, Audio/video telehealth visit is felt to be most appropriate for this patient at this time.  See MyChart message from today for patient consent regarding telehealth for [facility].  Date:  03/04/2019   ID:  Stacy Hernandez, DOB 21-Aug-1956, MRN [id number]  Location: Home  Provider location: [facility] Type of Visit: Established patient  PCP:  Deland Pretty, MD  Cardiologist: Dr. Aundra Dubin  Chief Complaint: Shortness of breath   History of Present Illness: Stacy Hernandez is a 63 y.o. female who presents via audio/video conferencing for a telehealth visit today.     she denies symptoms worrisome for COVID 19.   Patient has a history of HTN, intermittent LBBB, and nonischemic cardiomyopathy.  She was admitted with chest pain in 6/13.  ECG showed intermittent LBBB.  Echo showed EF 45-50%.  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    Developed CP and dyspnea. She was set up for echo and Cardiolite in 12/15.  Echo showed EF 10-15% (significant fall).  She had a Lexiscan Cardiolite with EF 25% and apical anteroseptal and apical fixed perfusion defect.  Therefore, she was sent for left and right heart catheterization in 1/16.  This showed mild elevation in LV and RV filling pressures and no significant CAD.  She was started on Lasix 20 mg daily.  Echo in 5/16 showed EF 20-25%, mildly dilated and mildly dysfunctional RV.  PFTs 4/16 showed severe obstruction and restriction.  She has seen Dr Melvyn Novas.  She is noted to have an elevated right hemidiaphragm.   In 6/16, she had Medtronic CRT-D system placed.  In 7/16, CPX showed low normal functional capacity with the main limitation being pulmonary.  Repeat echo in 11/16 showed improvement in EF to 40-45%.  Echo in 7/18 showed stable EF 40-45%.  Echo in 7/19 showed EF up to 50-55%.   She has been doing well recently.   She finished painting the inside of her house this week.  No significant exertional dyspnea.  No chest pain.  No lightheadedness.  No orthopnea/PND.  Still taking all meds (reviewed today).  Weight is down 10 lbs.    Labs (12/13): LDL 80, HDL 46, K 4, creatinine 0.7 Labs (6/14): K 4, creatinine 0.8, LDL 73, HDL 45 Labs (12/15): K 4.2, creatinine 0.7, HCT 43.3, LDL 60, HDL 51, BNP 577, TSH normal Labs (2/16): K 4.2, creatinine 0.7 Labs (1/16): SPEP no M spike K 4.3 Creatinine 0.74  Labs (2/16): K 4.2, creatinine 0.78 Labs (02/23/2015) : K 4.2 Creatinine 0.90, BNP 339 Labs (04/13/2015): K 4.5 Creatinine 1.01  Labs (05/17/2015): K 4.7 Creatinine 0.71  Labs (11/16): K 4.1, creatinine 0.74 Labs (6/17): K 4.2, creatinine 0.85, BNP 76 Labs (1/18): LDL 55, HDL 42 Labs (7/18): K 3.9, creatinine 0.91 Labs (1/19): K 4, creatinine 1.0, LDL 59, hgb 13.7 Labs (10/19): LDL 70 Labs (1/20): K 4.2, creatinine 0.96  PMH: 1. HTN 2. Hyperlipidemia 3. COPD: Prior smoker.  PFTs (4/16) with FVC 37%, FEV1 36%, ratio 97%, TLC 67%, DLCO 32% => severe obstruction and restriction.  COPD + elevated right hemidiaphragm.  4. Intermittent LBBB/IVCD 5. Hypothyroidism 6. Cardiomyopathy: Nonischemic.  Echo (6/13) with EF 45-50%, paradoxical septal motion.  LHC (6/13) with 40% D1 stenosis, 30% PLV stenosis.  Echo (12/15) with EF 10-15%, moderately dilated LV, diffuse hypokinesis, moderate MR.   Lexiscan Cardiolite (12/15) with EF 25%,  diffuse hypokinesis, apical anteroseptal and apical fixed defect. LHC/RHC (1/16) with mean RA 8, PA 37/19, mean PCWP 19, CI 2.63, EF 30%, no significant CAD.  Cardiac MRI 12/20/2014: EF 24% Mod mitral regurg thought to be from dilated LV, normal RV size, no LGE.  Echo (5/16) with EF 20-25%, mildly dilated and mildly dysfunctional RV, mild MR. Medtronic CRT-D placed 6/16.  CPX (7/16) with peak VO2 16.3, VE/VCO2 slope 25, RER 1.1, low normal functional capacity limited primarily by lung disease.  -  Echo (11/16) with EF 40-45%, mild MR.  - Echo (7/18): EF 40-45%, diffuse hypokinesis, grade II diastolic dysfunction.  - Echo (7/19): EF 50-55%, mild MR, PASP 37 mmHg.  7. 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  8. OSA: 04/18/2015 Sleep Study. She has not been able to tolerate CPAP. 9. ABIs (12/18): Normal  Current Outpatient Medications  Medication Sig Dispense Refill   aspirin 81 MG tablet Take 81 mg by mouth at bedtime.      atorvastatin (LIPITOR) 10 MG tablet TAKE 1 TABLET BY MOUTH EVERYDAY AT BEDTIME 90 tablet 2   carvedilol (COREG) 12.5 MG tablet TAKE 1 TABLET (12.5 MG TOTAL) BY MOUTH 2 (TWO) TIMES DAILY. 180 tablet 1   cholecalciferol (VITAMIN D) 400 units TABS tablet Take 400 Units by mouth daily.     ENTRESTO 97-103 MG TAKE 1 TABLET BY MOUTH TWICE A DAY 60 tablet 9   furosemide (LASIX) 20 MG tablet TAKE 1 TABLET BY MOUTH TWICE A DAY 180 tablet 3   levothyroxine (SYNTHROID, LEVOTHROID) 100 MCG tablet Take 100 mcg by mouth daily before breakfast.     Multiple Vitamins-Minerals (MULTIVITAMIN WITH MINERALS) tablet Take 1 tablet by mouth daily.     niacin 500 MG tablet Take 1,500 mg by mouth at bedtime.     nitroGLYCERIN (NITROSTAT) 0.4 MG SL tablet Place 1 tablet (0.4 mg total) under the tongue every 5 (five) minutes as needed for chest pain. 25 tablet 3   Phenylephrine-APAP-guaiFENesin (MUCINEX SINUS-MAX PO) Take 1 tablet by mouth daily as needed (sinuses).     potassium chloride (K-DUR) 10 MEQ tablet TAKE 1 TABLET BY MOUTH EVERY DAY 90 tablet 1   spironolactone (ALDACTONE) 25 MG tablet TAKE 1 TABLET BY MOUTH EVERY DAY 90 tablet 1   traMADol (ULTRAM) 50 MG tablet Take 50 mg by mouth every evening.     No current facility-administered medications for this encounter.     Allergies:   Prednisone   Social History:  The patient  reports that she quit smoking about 4 years ago. Her smoking use included cigarettes. She has a 70.00 pack-year smoking  history. She has never used smokeless tobacco. She reports that she does not drink alcohol or use drugs.   Family History:  The patient's family history includes COPD in her mother; Cirrhosis in her sister; Colon cancer (age of onset: 91) in her maternal uncle; Colon polyps in her mother; Coronary artery disease in her father; Diabetes in her sister; Heart attack in her father; Hypertension in her father.   ROS:  Please see the history of present illness.   All other systems are personally reviewed and negative.   Exam:  (Video/Tele Health Call; Exam is subjective and or/visual.) General:  Speaks in full sentences. No resp difficulty. Neck: No JVD Lungs: Normal respiratory effort with conversation.  Abdomen: Non-distended per patient report Extremities: Pt denies edema. Neuro: Alert & oriented x 3.   Recent Labs: 11/21/2018: BUN 16; Creatinine, Ser  0.96; Potassium 4.2; Sodium 139  Personally reviewed   Wt Readings from Last 3 Encounters:  08/21/18 86.8 kg (191 lb 6.4 oz)  08/08/18 87.1 kg (192 lb 0.3 oz)  05/21/18 87.1 kg (192 lb)      ASSESSMENT AND PLAN:  1. Cardiomyopathy: Nonischemic cardiomyopathy. In the past, she had EF 45-50% with no significant CAD (2013).  LBBB-related nonischemic cardiomyopathy.  However, in the fall of 2015 she developed exertional chest pain and dyspnea.  Echo and Cardiolite showed marked worsening of her LV systolic function (EF 28-76% by echo) and Cardiolite suggested possible prior MI.  Left heart cath in 1/16 showed no significant CAD.  RHC showed mildly elevated right and left heart filling pressures and normal CI. SPEP negative and no infiltrative disease on cardiac MRI. Repeat echo in 5/16 with EF 20-25%.  CPX showed limitations are related to lung disease as well as obesity, no HF limitation. Medtronic CRT-D placement.  Followup echo in 11/16 showed improvement in EF to 40-45%.  Most recent echo in 7/19 showed EF 50-55%.  Weight is down, she does not  appear volume overloaded on video exam.   - Continue Lasix 20 mg bid.  Arrange for BMET.   - Continue Coreg 12.5 mg bid.   - Continue Entresto to 97/103 bid.   - Continue spironolactone 25 mg daily.   - I will have her get an echo at followup in 6 months to make sure that EF remains improved.    2. Elevated right hemidiaphragm: This likely contributes to dyspnea.   3. OSA:  Has not been able to tolerate CPAP.    4. COPD: Severe by PFTs. No longer smokes.  5. Hyperlipidemia: LDL ok in 10/19.   COVID screen The patient does not have any symptoms that suggest any further testing/ screening at this time.  Social distancing reinforced today.  Patient Risk: After full review of this patients clinical status, I feel that they are at moderate risk for cardiac decompensation at this time.  Relevant cardiac medications were reviewed at length with the patient today. The patient does not have concerns regarding their medications at this time.   Recommended follow-up:  6 months with echo.   Today, I have spent 16 minutes with the patient with telehealth technology discussing the above issues .    Signed, Loralie Champagne, MD  03/04/2019 11:44 PM  [facility] [address] [facility] [place] [address] [phone] (office) [phone] (fax)

## 2019-03-04 NOTE — Patient Instructions (Signed)
Please get lab work done, you can either visit [facility] or a prescription has been included that you can take to [facility] has requested that you have an echocardiogram. Echocardiography is a painless test that uses sound waves to create images of your heart. It provides your doctor with information about the size and shape of your heart and how well your heart's chambers and valves are working. This procedure takes approximately one hour. There are no restrictions for this procedure.  Your physician wants you to follow-up in: 6 months. You will receive a reminder letter in the mail two months in advance. If you don't receive a letter, please call our office to schedule the follow-up appointment, this will be scheduled the same day as your ECHO.

## 2019-03-05 ENCOUNTER — Encounter (HOSPITAL_COMMUNITY): Payer: Medicare HMO | Admitting: Cardiology

## 2019-03-06 ENCOUNTER — Other Ambulatory Visit (HOSPITAL_COMMUNITY): Payer: Self-pay | Admitting: Cardiology

## 2019-03-11 ENCOUNTER — Other Ambulatory Visit: Payer: Self-pay | Admitting: Cardiology

## 2019-03-11 DIAGNOSIS — Z7689 Persons encountering health services in other specified circumstances: Secondary | ICD-10-CM | POA: Diagnosis not present

## 2019-03-12 LAB — BASIC METABOLIC PANEL
BUN/Creatinine Ratio: 19 (ref 12–28)
BUN: 18 mg/dL (ref 8–27)
CO2: 22 mmol/L (ref 20–29)
Calcium: 10 mg/dL (ref 8.7–10.3)
Chloride: 104 mmol/L (ref 96–106)
Creatinine, Ser: 0.93 mg/dL (ref 0.57–1.00)
GFR calc Af Amer: 76 mL/min/{1.73_m2} (ref >59)
GFR calc non Af Amer: 66 mL/min/{1.73_m2} (ref >59)
Glucose: 104 mg/dL — ABNORMAL HIGH (ref 65–99)
Potassium: 4.2 mmol/L (ref 3.5–5.2)
Sodium: 139 mmol/L (ref 134–144)

## 2019-03-12 LAB — SPECIMEN STATUS REPORT

## 2019-03-16 ENCOUNTER — Other Ambulatory Visit (HOSPITAL_COMMUNITY): Payer: Self-pay | Admitting: Cardiology

## 2019-04-11 ENCOUNTER — Other Ambulatory Visit: Payer: Self-pay | Admitting: Cardiology

## 2019-04-23 ENCOUNTER — Other Ambulatory Visit (HOSPITAL_COMMUNITY): Payer: Self-pay | Admitting: Cardiology

## 2019-05-19 ENCOUNTER — Ambulatory Visit (INDEPENDENT_AMBULATORY_CARE_PROVIDER_SITE_OTHER): Payer: Medicare HMO | Admitting: *Deleted

## 2019-05-19 DIAGNOSIS — I5022 Chronic systolic (congestive) heart failure: Secondary | ICD-10-CM

## 2019-05-19 DIAGNOSIS — I429 Cardiomyopathy, unspecified: Secondary | ICD-10-CM

## 2019-05-19 LAB — CUP PACEART REMOTE DEVICE CHECK
Battery Remaining Longevity: 53 mo
Battery Voltage: 2.97 V
Brady Statistic AP VP Percent: 15.72 %
Brady Statistic AP VS Percent: 0.26 %
Brady Statistic AS VP Percent: 82.64 %
Brady Statistic AS VS Percent: 1.39 %
Brady Statistic RA Percent Paced: 15.79 %
Brady Statistic RV Percent Paced: 93.08 %
Date Time Interrogation Session: 20200714092605
HighPow Impedance: 64 Ohm
Implantable Lead Implant Date: 20160610
Implantable Lead Implant Date: 20160610
Implantable Lead Implant Date: 20160610
Implantable Lead Location: 753858
Implantable Lead Location: 753859
Implantable Lead Location: 753860
Implantable Lead Model: 4396
Implantable Lead Model: 5076
Implantable Pulse Generator Implant Date: 20160610
Lead Channel Impedance Value: 342 Ohm
Lead Channel Impedance Value: 399 Ohm
Lead Channel Impedance Value: 456 Ohm
Lead Channel Impedance Value: 513 Ohm
Lead Channel Impedance Value: 551 Ohm
Lead Channel Impedance Value: 722 Ohm
Lead Channel Pacing Threshold Amplitude: 0.375 V
Lead Channel Pacing Threshold Amplitude: 0.375 V
Lead Channel Pacing Threshold Amplitude: 1 V
Lead Channel Pacing Threshold Pulse Width: 0.4 ms
Lead Channel Pacing Threshold Pulse Width: 0.4 ms
Lead Channel Pacing Threshold Pulse Width: 0.4 ms
Lead Channel Sensing Intrinsic Amplitude: 14 mV
Lead Channel Sensing Intrinsic Amplitude: 14 mV
Lead Channel Sensing Intrinsic Amplitude: 2.5 mV
Lead Channel Sensing Intrinsic Amplitude: 2.5 mV
Lead Channel Setting Pacing Amplitude: 1.5 V
Lead Channel Setting Pacing Amplitude: 2 V
Lead Channel Setting Pacing Amplitude: 2 V
Lead Channel Setting Pacing Pulse Width: 0.4 ms
Lead Channel Setting Pacing Pulse Width: 0.4 ms
Lead Channel Setting Sensing Sensitivity: 0.3 mV

## 2019-05-20 ENCOUNTER — Telehealth: Payer: Self-pay | Admitting: Internal Medicine

## 2019-05-20 NOTE — Telephone Encounter (Signed)

## 2019-05-21 ENCOUNTER — Encounter: Payer: Self-pay | Admitting: Internal Medicine

## 2019-05-21 ENCOUNTER — Ambulatory Visit (INDEPENDENT_AMBULATORY_CARE_PROVIDER_SITE_OTHER): Payer: Medicare HMO | Admitting: Internal Medicine

## 2019-05-21 ENCOUNTER — Other Ambulatory Visit: Payer: Self-pay

## 2019-05-21 VITALS — BP 108/68 | HR 68 | Ht 62.0 in | Wt 177.6 lb

## 2019-05-21 DIAGNOSIS — I5022 Chronic systolic (congestive) heart failure: Secondary | ICD-10-CM | POA: Diagnosis not present

## 2019-05-21 DIAGNOSIS — M150 Primary generalized (osteo)arthritis: Secondary | ICD-10-CM | POA: Diagnosis not present

## 2019-05-21 DIAGNOSIS — Z95810 Presence of automatic (implantable) cardiac defibrillator: Secondary | ICD-10-CM | POA: Diagnosis not present

## 2019-05-21 DIAGNOSIS — M1990 Unspecified osteoarthritis, unspecified site: Secondary | ICD-10-CM | POA: Diagnosis not present

## 2019-05-21 DIAGNOSIS — I429 Cardiomyopathy, unspecified: Secondary | ICD-10-CM | POA: Diagnosis not present

## 2019-05-21 DIAGNOSIS — M5136 Other intervertebral disc degeneration, lumbar region: Secondary | ICD-10-CM | POA: Diagnosis not present

## 2019-05-21 LAB — CUP PACEART INCLINIC DEVICE CHECK
Battery Remaining Longevity: 54 mo
Battery Voltage: 2.97 V
Brady Statistic AP VP Percent: 10.85 %
Brady Statistic AP VS Percent: 0.18 %
Brady Statistic AS VP Percent: 87.64 %
Brady Statistic AS VS Percent: 1.33 %
Brady Statistic RA Percent Paced: 10.97 %
Brady Statistic RV Percent Paced: 96.07 %
Date Time Interrogation Session: 20200716155045
HighPow Impedance: 68 Ohm
Implantable Lead Implant Date: 20160610
Implantable Lead Implant Date: 20160610
Implantable Lead Implant Date: 20160610
Implantable Lead Location: 753858
Implantable Lead Location: 753859
Implantable Lead Location: 753860
Implantable Lead Model: 4396
Implantable Lead Model: 5076
Implantable Pulse Generator Implant Date: 20160610
Lead Channel Impedance Value: 361 Ohm
Lead Channel Impedance Value: 418 Ohm
Lead Channel Impedance Value: 475 Ohm
Lead Channel Impedance Value: 589 Ohm
Lead Channel Impedance Value: 608 Ohm
Lead Channel Impedance Value: 779 Ohm
Lead Channel Pacing Threshold Amplitude: 0.375 V
Lead Channel Pacing Threshold Amplitude: 0.5 V
Lead Channel Pacing Threshold Amplitude: 1 V
Lead Channel Pacing Threshold Pulse Width: 0.4 ms
Lead Channel Pacing Threshold Pulse Width: 0.4 ms
Lead Channel Pacing Threshold Pulse Width: 0.4 ms
Lead Channel Sensing Intrinsic Amplitude: 1.875 mV
Lead Channel Sensing Intrinsic Amplitude: 13.375 mV
Lead Channel Sensing Intrinsic Amplitude: 15.375 mV
Lead Channel Sensing Intrinsic Amplitude: 2.375 mV
Lead Channel Setting Pacing Amplitude: 1.5 V
Lead Channel Setting Pacing Amplitude: 2 V
Lead Channel Setting Pacing Amplitude: 2 V
Lead Channel Setting Pacing Pulse Width: 0.4 ms
Lead Channel Setting Pacing Pulse Width: 0.4 ms
Lead Channel Setting Sensing Sensitivity: 0.3 mV

## 2019-05-21 NOTE — Progress Notes (Signed)
Electrophysiology Office Note Date: 05/21/2019  ID:  Stacy Hernandez, DOB January 17, 1956, MRN [id number]  PCP: Deland Pretty, MD Primary Cardiologist: No primary care provider on file. Electrophysiologist: None  CC: Routine ICD follow-up  Stacy Hernandez is a 63 y.o. female seen today with Dr. Lovena Le.   They present today for routine electrophysiology followup.  Since last being seen in our clinic, the patient reports doing very well. She denies denies symptoms of palpitations, chest pain, shortness of breath, orthopnea, PND, lower extremity edema, claudication, dizziness, presyncope, syncope, bleeding, or neurologic sequela. The patient is tolerating medications without difficulties.  She has not had any ICD therapies.  Device History: MDT CRT-D implanted 04/2015 for chronic systolic CHF, NICM, with intermittent LBBB. History of appropriate therapy: No History of AAD therapy: No   Past Medical History:  Diagnosis Date  . AICD (automatic cardioverter/defibrillator) present 04/15/2015   Medtronic (serial Number [id number]) biventricular ICD  . Back pain    Recent positive ANA but told she has OA  . CAD (coronary artery disease)    a.  per cath in June 2013 with nonobstructive CAD;  b.  Lexiscan Myoview (12/15):  No ischemia.  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 Cardiomyopathy ([place])    a.  EF 45-50% by echo 04/23/2012;  b.  Echo (12/15):  EF 10-15%, Gr 2 DD, ventricular septum dyssynergy, mod MR, mod LAE.  Stacy Hernandez COPD (chronic obstructive pulmonary disease) ([place])   . Hypercalcemia    Due to HCTZ, improved with d/c of calcium supplements  . Hypertension   . Hypertriglyceridemia   . Nephrolithiasis   . OA (osteoarthritis)   . Osteoporosis   . Sleep apnea   . Thyroid disease    Post-ablation hypothyroidism  . Tobacco abuse    Past Surgical History:  Procedure Laterality Date  . BIOPSY  08/08/2018   Procedure:  BIOPSY;  Surgeon: Carol Ada, MD;  Location: [facility];  Service: Endoscopy;;  . BLADDER SURGERY     bladder & bowel tac with mesh  . CARPAL TUNNEL RELEASE    . CESAREAN SECTION    . EP IMPLANTABLE DEVICE N/A 04/15/2015   Procedure: BiV ICD Insertion CRT-D;  Surgeon: Evans Lance, MD;  Medtronic (serial Number [id number]) biventricular ICD  . ESOPHAGOGASTRODUODENOSCOPY (EGD) WITH PROPOFOL N/A 08/08/2018   Procedure: ESOPHAGOGASTRODUODENOSCOPY (EGD) WITH PROPOFOL;  Surgeon: Carol Ada, MD;  Location: [facility];  Service: Endoscopy;  Laterality: N/A;  . LEFT AND RIGHT HEART CATHETERIZATION WITH CORONARY ANGIOGRAM N/A 11/12/2014   Procedure: LEFT AND RIGHT HEART CATHETERIZATION WITH CORONARY ANGIOGRAM;  Surgeon: Larey Dresser, MD;  Location: [facility] CATH LAB;  Service: Cardiovascular;  Laterality: N/A;  . LEFT HEART CATHETERIZATION WITH CORONARY ANGIOGRAM N/A 04/22/2012   Procedure: LEFT HEART CATHETERIZATION WITH CORONARY ANGIOGRAM;  Surgeon: Hillary Bow, MD;  Location: [facility] CATH LAB;  Service: Cardiovascular;  Laterality: N/A;  . PARTIAL HYSTERECTOMY     Due to cervical dysplasia  . SAVORY DILATION N/A 08/08/2018   Procedure: SAVORY DILATION;  Surgeon: Carol Ada, MD;  Location: [facility];  Service: Endoscopy;  Laterality: N/A;  . TONSILLECTOMY      Current Outpatient Medications  Medication Sig Dispense Refill  . aspirin 81 MG tablet Take 81 mg by mouth at bedtime.     Stacy Hernandez atorvastatin (LIPITOR) 10 MG tablet TAKE 1 TABLET BY MOUTH EVERYDAY AT BEDTIME 90 tablet 2  . carvedilol (  COREG) 12.5 MG tablet TAKE 1 TABLET (12.5 MG TOTAL) BY MOUTH 2 (TWO) TIMES DAILY. 180 tablet 1  . cholecalciferol (VITAMIN D) 400 units TABS tablet Take 400 Units by mouth daily.    Stacy Hernandez ENTRESTO 97-103 MG TAKE 1 TABLET BY MOUTH TWICE A DAY 60 tablet 9  . furosemide (LASIX) 20 MG tablet TAKE 1 TABLET BY MOUTH TWICE A DAY 180 tablet 3  . levothyroxine (SYNTHROID, LEVOTHROID) 100 MCG tablet Take 100 mcg by  mouth daily before breakfast.    . Multiple Vitamins-Minerals (MULTIVITAMIN WITH MINERALS) tablet Take 1 tablet by mouth daily.    . niacin 500 MG tablet Take 1,500 mg by mouth at bedtime.    . nitroGLYCERIN (NITROSTAT) 0.4 MG SL tablet Place 1 tablet (0.4 mg total) under the tongue every 5 (five) minutes as needed for chest pain. 25 tablet 3  . Phenylephrine-APAP-guaiFENesin (MUCINEX SINUS-MAX PO) Take 1 tablet by mouth daily as needed (sinuses).    . potassium chloride (K-DUR) 10 MEQ tablet TAKE 1 TABLET BY MOUTH EVERY DAY 90 tablet 1  . spironolactone (ALDACTONE) 25 MG tablet TAKE 1 TABLET BY MOUTH EVERY DAY 90 tablet 1  . traMADol (ULTRAM) 50 MG tablet Take 50 mg by mouth every evening.     No current facility-administered medications for this visit.     Allergies:   Prednisone   Social History: Social History   Socioeconomic History  . Marital status: Married    Spouse name: Not on file  . Number of children: Not on file  . Years of education: Not on file  . Highest education level: Not on file  Occupational History  . Not on file  Social Needs  . Financial resource strain: Not on file  . Food insecurity    Worry: Not on file    Inability: Not on file  . Transportation needs    Medical: Not on file    Non-medical: Not on file  Tobacco Use  . Smoking status: Former Smoker    Packs/day: 2.00    Years: 35.00    Pack years: 70.00    Types: Cigarettes    Quit date: 11/05/2014    Years since quitting: 4.5  . Smokeless tobacco: Never Used  Substance and Sexual Activity  . Alcohol use: No    Alcohol/week: 0.0 standard drinks  . Drug use: No  . Sexual activity: Not Currently  Lifestyle  . Physical activity    Days per week: Not on file    Minutes per session: Not on file  . Stress: Not on file  Relationships  . Social Herbalist on phone: Not on file    Gets together: Not on file    Attends religious service: Not on file    Active member of club or  organization: Not on file    Attends meetings of clubs or organizations: Not on file    Relationship status: Not on file  . Intimate partner violence    Fear of current or ex partner: Not on file    Emotionally abused: Not on file    Physically abused: Not on file    Forced sexual activity: Not on file  Other Topics Concern  . Not on file  Social History Narrative  . Not on file    Family History: Family History  Problem Relation Age of Onset  . Coronary artery disease Father        s/p CABG in his 61s  .  Hypertension Father   . Heart attack Father   . COPD Mother        smoked  . Colon polyps Mother   . Colon cancer Maternal Uncle 78  . Diabetes Sister   . Cirrhosis Sister   . Stroke Neg Hx     Review of Systems: All other systems reviewed and are otherwise negative except as noted above.   Physical Exam: Vitals:   05/21/19 1531  BP: 108/68  Pulse: 68  SpO2: 98%  Weight: 177 lb 9.6 oz (80.6 kg)  Height: 5\' 2"  (1.575 m)     GEN- The patient is well appearing, alert and oriented x 3 today.   HEENT: normocephalic, atraumatic; sclera clear, conjunctiva pink; hearing intact; oropharynx clear; neck supple, no JVP Lymph- no cervical lymphadenopathy Lungs- Clear to ausculation bilaterally, normal work of breathing.  No wheezes, rales, rhonchi Heart- Regular rate and rhythm, no murmurs, rubs or gallops, PMI not laterally displaced GI- soft, non-tender, non-distended, bowel sounds present, no hepatosplenomegaly Extremities- no clubbing, cyanosis, or edema; DP/PT/radial pulses 2+ bilaterally MS- no significant deformity or atrophy Skin- warm and dry, no rash or lesion; ICD pocket well healed Psych- euthymic mood, full affect Neuro- strength and sensation are intact  ICD interrogation- reviewed in detail today,  See PACEART report  EKG:  EKG is not ordered today.  Recent Labs: 03/11/2019: BUN 18; Creatinine, Ser 0.93; Potassium 4.2; Sodium 139   Wt Readings from Last  3 Encounters:  05/21/19 177 lb 9.6 oz (80.6 kg)  08/21/18 191 lb 6.4 oz (86.8 kg)  08/08/18 192 lb 0.3 oz (87.1 kg)     Other studies Reviewed: Additional studies/ records that were reviewed today include: Previous Echo.  Assessment and Plan:  1.  Chronic systolic dysfunction  Euvolemic today. EF has had interval normalization with CRT and GDMT.  Stable on an appropriate medical regimen Normal ICD function See Pace Art report No changes today 2. Obesity - Body mass index is 32.48 kg/m.  3. ICD - MDT BiV ICD is working normally.  Current medicines are reviewed at length with the patient today.   The patient does not have concerns regarding her medicines.  The following changes were made today:  none  Labs/ tests ordered today include: No orders of the defined types were placed in this encounter.  Disposition:   Continue 3 month remotes. RTC with me in one year  Carleene Overlie Taylor,M.D.   3:45 PM  [address] [place] [phone] (office) [phone] (fax)

## 2019-05-21 NOTE — Patient Instructions (Signed)

## 2019-06-01 DIAGNOSIS — N390 Urinary tract infection, site not specified: Secondary | ICD-10-CM | POA: Diagnosis not present

## 2019-06-01 DIAGNOSIS — I10 Essential (primary) hypertension: Secondary | ICD-10-CM | POA: Diagnosis not present

## 2019-06-01 DIAGNOSIS — E7800 Pure hypercholesterolemia, unspecified: Secondary | ICD-10-CM | POA: Diagnosis not present

## 2019-06-01 DIAGNOSIS — E559 Vitamin D deficiency, unspecified: Secondary | ICD-10-CM | POA: Diagnosis not present

## 2019-06-02 NOTE — Progress Notes (Signed)
Remote ICD transmission.   

## 2019-06-16 DIAGNOSIS — I10 Essential (primary) hypertension: Secondary | ICD-10-CM | POA: Diagnosis not present

## 2019-06-16 DIAGNOSIS — E7800 Pure hypercholesterolemia, unspecified: Secondary | ICD-10-CM | POA: Diagnosis not present

## 2019-06-16 DIAGNOSIS — I509 Heart failure, unspecified: Secondary | ICD-10-CM | POA: Diagnosis not present

## 2019-06-16 DIAGNOSIS — M5136 Other intervertebral disc degeneration, lumbar region: Secondary | ICD-10-CM | POA: Diagnosis not present

## 2019-06-16 DIAGNOSIS — Z95810 Presence of automatic (implantable) cardiac defibrillator: Secondary | ICD-10-CM | POA: Diagnosis not present

## 2019-06-16 DIAGNOSIS — Z0000 Encounter for general adult medical examination without abnormal findings: Secondary | ICD-10-CM | POA: Diagnosis not present

## 2019-06-16 DIAGNOSIS — H9391 Unspecified disorder of right ear: Secondary | ICD-10-CM | POA: Diagnosis not present

## 2019-06-16 DIAGNOSIS — E890 Postprocedural hypothyroidism: Secondary | ICD-10-CM | POA: Diagnosis not present

## 2019-06-22 DIAGNOSIS — H61001 Unspecified perichondritis of right external ear: Secondary | ICD-10-CM | POA: Diagnosis not present

## 2019-06-22 DIAGNOSIS — B078 Other viral warts: Secondary | ICD-10-CM | POA: Diagnosis not present

## 2019-06-22 DIAGNOSIS — L738 Other specified follicular disorders: Secondary | ICD-10-CM | POA: Diagnosis not present

## 2019-06-24 DIAGNOSIS — E559 Vitamin D deficiency, unspecified: Secondary | ICD-10-CM | POA: Diagnosis not present

## 2019-06-24 DIAGNOSIS — E213 Hyperparathyroidism, unspecified: Secondary | ICD-10-CM | POA: Diagnosis not present

## 2019-06-24 DIAGNOSIS — E039 Hypothyroidism, unspecified: Secondary | ICD-10-CM | POA: Diagnosis not present

## 2019-06-24 DIAGNOSIS — Z0000 Encounter for general adult medical examination without abnormal findings: Secondary | ICD-10-CM | POA: Diagnosis not present

## 2019-07-01 DIAGNOSIS — E213 Hyperparathyroidism, unspecified: Secondary | ICD-10-CM | POA: Diagnosis not present

## 2019-07-01 DIAGNOSIS — E7800 Pure hypercholesterolemia, unspecified: Secondary | ICD-10-CM | POA: Diagnosis not present

## 2019-07-01 DIAGNOSIS — E559 Vitamin D deficiency, unspecified: Secondary | ICD-10-CM | POA: Diagnosis not present

## 2019-07-01 DIAGNOSIS — I10 Essential (primary) hypertension: Secondary | ICD-10-CM | POA: Diagnosis not present

## 2019-07-01 DIAGNOSIS — M859 Disorder of bone density and structure, unspecified: Secondary | ICD-10-CM | POA: Diagnosis not present

## 2019-07-01 DIAGNOSIS — E890 Postprocedural hypothyroidism: Secondary | ICD-10-CM | POA: Diagnosis not present

## 2019-07-02 ENCOUNTER — Other Ambulatory Visit: Payer: Self-pay | Admitting: Endocrinology

## 2019-07-03 ENCOUNTER — Other Ambulatory Visit: Payer: Self-pay | Admitting: Endocrinology

## 2019-07-06 ENCOUNTER — Other Ambulatory Visit: Payer: Self-pay | Admitting: Endocrinology

## 2019-07-06 DIAGNOSIS — R5381 Other malaise: Secondary | ICD-10-CM

## 2019-07-07 ENCOUNTER — Other Ambulatory Visit: Payer: Self-pay | Admitting: Endocrinology

## 2019-07-07 ENCOUNTER — Other Ambulatory Visit: Payer: Self-pay | Admitting: Internal Medicine

## 2019-07-07 DIAGNOSIS — E213 Hyperparathyroidism, unspecified: Secondary | ICD-10-CM

## 2019-07-07 DIAGNOSIS — M8580 Other specified disorders of bone density and structure, unspecified site: Secondary | ICD-10-CM

## 2019-07-07 DIAGNOSIS — Z1231 Encounter for screening mammogram for malignant neoplasm of breast: Secondary | ICD-10-CM

## 2019-07-10 DIAGNOSIS — Z23 Encounter for immunization: Secondary | ICD-10-CM | POA: Diagnosis not present

## 2019-07-27 ENCOUNTER — Other Ambulatory Visit (HOSPITAL_COMMUNITY): Payer: Self-pay | Admitting: Cardiology

## 2019-08-20 ENCOUNTER — Ambulatory Visit (INDEPENDENT_AMBULATORY_CARE_PROVIDER_SITE_OTHER): Payer: Medicare HMO | Admitting: *Deleted

## 2019-08-20 DIAGNOSIS — I429 Cardiomyopathy, unspecified: Secondary | ICD-10-CM | POA: Diagnosis not present

## 2019-08-20 DIAGNOSIS — I5022 Chronic systolic (congestive) heart failure: Secondary | ICD-10-CM

## 2019-08-20 LAB — CUP PACEART REMOTE DEVICE CHECK
Battery Remaining Longevity: 46 mo
Battery Voltage: 2.97 V
Brady Statistic AP VP Percent: 15.64 %
Brady Statistic AP VS Percent: 0.26 %
Brady Statistic AS VP Percent: 82.76 %
Brady Statistic AS VS Percent: 1.34 %
Brady Statistic RA Percent Paced: 15.73 %
Brady Statistic RV Percent Paced: 93.02 %
Date Time Interrogation Session: 20201015113825
HighPow Impedance: 66 Ohm
Implantable Lead Implant Date: 20160610
Implantable Lead Implant Date: 20160610
Implantable Lead Implant Date: 20160610
Implantable Lead Location: 753858
Implantable Lead Location: 753859
Implantable Lead Location: 753860
Implantable Lead Model: 4396
Implantable Lead Model: 5076
Implantable Pulse Generator Implant Date: 20160610
Lead Channel Impedance Value: 342 Ohm
Lead Channel Impedance Value: 361 Ohm
Lead Channel Impedance Value: 399 Ohm
Lead Channel Impedance Value: 551 Ohm
Lead Channel Impedance Value: 589 Ohm
Lead Channel Impedance Value: 665 Ohm
Lead Channel Pacing Threshold Amplitude: 0.5 V
Lead Channel Pacing Threshold Amplitude: 0.5 V
Lead Channel Pacing Threshold Amplitude: 1.375 V
Lead Channel Pacing Threshold Pulse Width: 0.4 ms
Lead Channel Pacing Threshold Pulse Width: 0.4 ms
Lead Channel Pacing Threshold Pulse Width: 0.4 ms
Lead Channel Sensing Intrinsic Amplitude: 13.5 mV
Lead Channel Sensing Intrinsic Amplitude: 13.5 mV
Lead Channel Sensing Intrinsic Amplitude: 2.25 mV
Lead Channel Sensing Intrinsic Amplitude: 2.25 mV
Lead Channel Setting Pacing Amplitude: 1.5 V
Lead Channel Setting Pacing Amplitude: 2 V
Lead Channel Setting Pacing Amplitude: 2.5 V
Lead Channel Setting Pacing Pulse Width: 0.4 ms
Lead Channel Setting Pacing Pulse Width: 0.4 ms
Lead Channel Setting Sensing Sensitivity: 0.3 mV

## 2019-08-31 ENCOUNTER — Other Ambulatory Visit: Payer: Self-pay | Admitting: Cardiology

## 2019-09-03 NOTE — Progress Notes (Signed)
Remote ICD transmission.   

## 2019-09-08 ENCOUNTER — Encounter (HOSPITAL_COMMUNITY): Payer: Self-pay | Admitting: Cardiology

## 2019-09-08 ENCOUNTER — Ambulatory Visit (HOSPITAL_COMMUNITY)
Admission: RE | Admit: 2019-09-08 | Discharge: 2019-09-08 | Disposition: A | Discharge status: Home / Self Care | Payer: Medicare HMO | Source: Ambulatory Visit | Attending: Cardiology | Admitting: Cardiology

## 2019-09-08 ENCOUNTER — Other Ambulatory Visit: Payer: Self-pay

## 2019-09-08 ENCOUNTER — Ambulatory Visit (HOSPITAL_BASED_OUTPATIENT_CLINIC_OR_DEPARTMENT_OTHER)
Admission: RE | Admit: 2019-09-08 | Discharge: 2019-09-08 | Disposition: A | Discharge status: Home / Self Care | Payer: Medicare HMO | Source: Ambulatory Visit | Attending: Cardiology | Admitting: Cardiology

## 2019-09-08 VITALS — BP 127/58 | HR 64 | Wt 177.4 lb

## 2019-09-08 DIAGNOSIS — E039 Hypothyroidism, unspecified: Secondary | ICD-10-CM | POA: Insufficient documentation

## 2019-09-08 DIAGNOSIS — Z7982 Long term (current) use of aspirin: Secondary | ICD-10-CM | POA: Insufficient documentation

## 2019-09-08 DIAGNOSIS — I447 Left bundle-branch block, unspecified: Secondary | ICD-10-CM | POA: Insufficient documentation

## 2019-09-08 DIAGNOSIS — J449 Chronic obstructive pulmonary disease, unspecified: Secondary | ICD-10-CM | POA: Diagnosis not present

## 2019-09-08 DIAGNOSIS — E785 Hyperlipidemia, unspecified: Secondary | ICD-10-CM | POA: Insufficient documentation

## 2019-09-08 DIAGNOSIS — Z888 Allergy status to other drugs, medicaments and biological substances status: Secondary | ICD-10-CM | POA: Diagnosis not present

## 2019-09-08 DIAGNOSIS — I5022 Chronic systolic (congestive) heart failure: Secondary | ICD-10-CM | POA: Diagnosis not present

## 2019-09-08 DIAGNOSIS — Z79899 Other long term (current) drug therapy: Secondary | ICD-10-CM | POA: Diagnosis not present

## 2019-09-08 DIAGNOSIS — Z79890 Hormone replacement therapy (postmenopausal): Secondary | ICD-10-CM | POA: Insufficient documentation

## 2019-09-08 DIAGNOSIS — G4733 Obstructive sleep apnea (adult) (pediatric): Secondary | ICD-10-CM | POA: Diagnosis not present

## 2019-09-08 DIAGNOSIS — I110 Hypertensive heart disease with heart failure: Secondary | ICD-10-CM | POA: Insufficient documentation

## 2019-09-08 DIAGNOSIS — I2510 Atherosclerotic heart disease of native coronary artery without angina pectoris: Secondary | ICD-10-CM | POA: Diagnosis not present

## 2019-09-08 DIAGNOSIS — Z8249 Family history of ischemic heart disease and other diseases of the circulatory system: Secondary | ICD-10-CM | POA: Diagnosis not present

## 2019-09-08 DIAGNOSIS — I428 Other cardiomyopathies: Secondary | ICD-10-CM | POA: Insufficient documentation

## 2019-09-08 LAB — BASIC METABOLIC PANEL
Anion gap: 11 (ref 5–15)
BUN: 15 mg/dL (ref 8–23)
CO2: 26 mmol/L (ref 22–32)
Calcium: 9.9 mg/dL (ref 8.9–10.3)
Chloride: 105 mmol/L (ref 98–111)
Creatinine, Ser: 0.94 mg/dL (ref 0.44–1.00)
GFR calc Af Amer: >60 mL/min (ref >60)
GFR calc non Af Amer: >60 mL/min (ref >60)
Glucose, Bld: 99 mg/dL (ref 70–99)
Potassium: 4.2 mmol/L (ref 3.5–5.1)
Sodium: 142 mmol/L (ref 135–145)

## 2019-09-08 NOTE — Patient Instructions (Addendum)
Labs done today. We will contact you only if your labs are abnormal.  No medication changes were made. Please continue all current medications as prescribed.  Your physician recommends that you schedule a follow-up appointment in: 3 months for a lab only visit and a appointment with Dr. Aundra Dubin in 6 months(we will contact you to schedule this appointment.)   At the [facility], you and your health needs are our priority. As part of our continuing mission to provide you with exceptional heart care, we have created designated Provider Care Teams. These Care Teams include your primary Cardiologist (physician) and Advanced Practice Providers (APPs- Physician Assistants and Nurse Practitioners) who all work together to provide you with the care you need, when you need it.   You may see any of the following providers on your designated Care Team at your next follow up: Marland Kitchen Dr Glori Bickers . Dr Loralie Champagne . Darrick Grinder, NP . Lyda Jester, PA   Please be sure to bring in all your medications bottles to every appointment.

## 2019-09-08 NOTE — Progress Notes (Signed)
  Echocardiogram 2D Echocardiogram has been performed.  Deniz Hannan A Dajon Lazar 09/08/2019, 10:33 AM

## 2019-09-09 NOTE — Progress Notes (Signed)
Date:  09/09/2019   ID:  Stacy Hernandez, DOB 1956/01/30, MRN [id number]   Provider location: [facility] Type of Visit: Established patient  PCP:  Deland Pretty, MD  Cardiologist: Dr. Aundra Dubin  Chief Complaint: Shortness of breath   History of Present Illness: Stacy Hernandez is a 63 y.o. female who has a history of HTN, intermittent LBBB, and nonischemic cardiomyopathy.  She was admitted with chest pain in 6/13.  ECG showed intermittent LBBB.  Echo showed EF 45-50%.  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    Developed CP and dyspnea. She was set up for echo and Cardiolite in 12/15.  Echo showed EF 10-15% (significant fall).  She had a Lexiscan Cardiolite with EF 25% and apical anteroseptal and apical fixed perfusion defect.  Therefore, she was sent for left and right heart catheterization in 1/16.  This showed mild elevation in LV and RV filling pressures and no significant CAD.  She was started on Lasix 20 mg daily.  Echo in 5/16 showed EF 20-25%, mildly dilated and mildly dysfunctional RV.  PFTs 4/16 showed severe obstruction and restriction.  She has seen Dr Melvyn Novas.  She is noted to have an elevated right hemidiaphragm.   In 6/16, she had Medtronic CRT-D system placed.  In 7/16, CPX showed low normal functional capacity with the main limitation being pulmonary.  Repeat echo in 11/16 showed improvement in EF to 40-45%.  Echo in 7/18 showed stable EF 40-45%.  Echo in 7/19 showed EF up to 50-55%.  Echo was done today and reviewed, EF 50-55% with mildly decreased RV systolic function.   She returns today for followup of CHF.  No significant exertional dyspnea, she is able to walk around [employer] without problems.  No orthopnea/PND.  No chest pain.  She is no longer smoking.   MDT device interrogation: Optivol showed stable thoracic impedance, no VT/AF, >99% BiV pacing   Labs (12/13): LDL 80, HDL 46, K 4, creatinine 0.7 Labs (6/14): K 4, creatinine 0.8,  LDL 73, HDL 45 Labs (12/15): K 4.2, creatinine 0.7, HCT 43.3, LDL 60, HDL 51, BNP 577, TSH normal Labs (2/16): K 4.2, creatinine 0.7 Labs (1/16): SPEP no M spike K 4.3 Creatinine 0.74  Labs (2/16): K 4.2, creatinine 0.78 Labs (02/23/2015) : K 4.2 Creatinine 0.90, BNP 339 Labs (04/13/2015): K 4.5 Creatinine 1.01  Labs (05/17/2015): K 4.7 Creatinine 0.71  Labs (11/16): K 4.1, creatinine 0.74 Labs (6/17): K 4.2, creatinine 0.85, BNP 76 Labs (1/18): LDL 55, HDL 42 Labs (7/18): K 3.9, creatinine 0.91 Labs (1/19): K 4, creatinine 1.0, LDL 59, hgb 13.7 Labs (10/19): LDL 70 Labs (1/20): K 4.2, creatinine 0.96 Labs (7/20): K 4, creatinine 1.1, LDL 76, hgb 13.2  PMH: 1. HTN 2. Hyperlipidemia 3. COPD: Prior smoker.  PFTs (4/16) with FVC 37%, FEV1 36%, ratio 97%, TLC 67%, DLCO 32% => severe obstruction and restriction.  COPD + elevated right hemidiaphragm.  4. Intermittent LBBB/IVCD 5. Hypothyroidism 6. Cardiomyopathy: Nonischemic.  Echo (6/13) with EF 45-50%, paradoxical septal motion.  LHC (6/13) with 40% D1 stenosis, 30% PLV stenosis.  Echo (12/15) with EF 10-15%, moderately dilated LV, diffuse hypokinesis, moderate MR.   Lexiscan Cardiolite (12/15) with EF 25%, diffuse hypokinesis, apical anteroseptal and apical fixed defect. LHC/RHC (1/16) with mean RA 8, PA 37/19, mean PCWP 19, CI 2.63, EF 30%, no significant CAD.  Cardiac MRI 12/20/2014: EF 24% Mod mitral regurg thought to be from dilated LV, normal  RV size, no LGE.  Echo (5/16) with EF 20-25%, mildly dilated and mildly dysfunctional RV, mild MR. Medtronic CRT-D placed 6/16.  CPX (7/16) with peak VO2 16.3, VE/VCO2 slope 25, RER 1.1, low normal functional capacity limited primarily by lung disease.  - Echo (11/16) with EF 40-45%, mild MR.  - Echo (7/18): EF 40-45%, diffuse hypokinesis, grade II diastolic dysfunction.  - Echo (7/19): EF 50-55%, mild MR, PASP 37 mmHg.  - Echo (10/20): EF 50-55%, septal bounce, mildly decreased RV systolic function,  PASP 26 mmHg.  7. 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  8. OSA: 04/18/2015 Sleep Study. She has not been able to tolerate CPAP. 9. ABIs (12/18): Normal  Current Outpatient Medications  Medication Sig Dispense Refill  . aspirin 81 MG tablet Take 81 mg by mouth at bedtime.     Marland Kitchen atorvastatin (LIPITOR) 10 MG tablet TAKE 1 TABLET BY MOUTH EVERYDAY AT BEDTIME 90 tablet 2  . augmented betamethasone dipropionate (DIPROLENE-AF) 0.05 % cream APPLY TO AFFECTED AREA TWICE A DAY AS NEEDED (NOT TO FACE, GROIN, OR UNDERARMS)    . carvedilol (COREG) 12.5 MG tablet TAKE 1 TABLET (12.5 MG TOTAL) BY MOUTH 2 (TWO) TIMES DAILY. 180 tablet 1  . cholecalciferol (VITAMIN D) 400 units TABS tablet Take 400 Units by mouth daily.    Marland Kitchen ENTRESTO 97-103 MG TAKE 1 TABLET BY MOUTH TWICE A DAY 60 tablet 9  . furosemide (LASIX) 20 MG tablet Take 20 mg by mouth daily.    Marland Kitchen levothyroxine (SYNTHROID, LEVOTHROID) 100 MCG tablet Take 100 mcg by mouth daily before breakfast.    . Multiple Vitamins-Minerals (MULTIVITAMIN WITH MINERALS) tablet Take 1 tablet by mouth daily.    . niacin 500 MG tablet Take 1,500 mg by mouth at bedtime.    . nitroGLYCERIN (NITROSTAT) 0.4 MG SL tablet Place 1 tablet (0.4 mg total) under the tongue every 5 (five) minutes as needed for chest pain. 25 tablet 3  . Phenylephrine-APAP-guaiFENesin (MUCINEX SINUS-MAX PO) Take 1 tablet by mouth daily as needed (sinuses).    . potassium chloride (K-DUR) 10 MEQ tablet TAKE 1 TABLET BY MOUTH EVERY DAY 90 tablet 1  . spironolactone (ALDACTONE) 25 MG tablet TAKE 1 TABLET BY MOUTH EVERY DAY 90 tablet 1  . traMADol (ULTRAM) 50 MG tablet Take 50 mg by mouth every evening.     No current facility-administered medications for this encounter.     Allergies:   Prednisone   Social History:  The patient  reports that she quit smoking about 4 years ago. Her smoking use included cigarettes. She has a 70.00 pack-year smoking history. She has never  used smokeless tobacco. She reports that she does not drink alcohol or use drugs.   Family History:  The patient's family history includes COPD in her mother; Cirrhosis in her sister; Colon cancer (age of onset: 35) in her maternal uncle; Colon polyps in her mother; Coronary artery disease in her father; Diabetes in her sister; Heart attack in her father; Hypertension in her father.   ROS:  Please see the history of present illness.   All other systems are personally reviewed and negative.   Exam:   BP (!) 127/58   Pulse 64   Wt 80.5 kg (177 lb 6.4 oz)   SpO2 99%   BMI 32.45 kg/m   General: NAD Neck: No JVD, no thyromegaly or thyroid nodule.  Lungs: Clear to auscultation bilaterally with normal respiratory effort. CV: Nondisplaced PMI.  Heart regular  S1/S2, no S3/S4, no murmur.  No peripheral edema.  No carotid bruit.  Normal pedal pulses.  Abdomen: Soft, nontender, no hepatosplenomegaly, no distention.  Skin: Intact without lesions or rashes.  Neurologic: Alert and oriented x 3.  Psych: Normal affect. Extremities: No clubbing or cyanosis.  HEENT: Normal.    Recent Labs: 09/08/2019: BUN 15; Creatinine, Ser 0.94; Potassium 4.2; Sodium 142  Personally reviewed   Wt Readings from Last 3 Encounters:  09/08/19 80.5 kg (177 lb 6.4 oz)  05/21/19 80.6 kg (177 lb 9.6 oz)  08/21/18 86.8 kg (191 lb 6.4 oz)      ASSESSMENT AND PLAN:  1. Cardiomyopathy: Nonischemic cardiomyopathy. In the past, she had EF 45-50% with no significant CAD (2013).  LBBB-related nonischemic cardiomyopathy.  However, in the fall of 2015 she developed exertional chest pain and dyspnea.  Echo and Cardiolite showed marked worsening of her LV systolic function (EF [id number] by echo) and Cardiolite suggested possible prior MI.  Left heart cath in 1/16 showed no significant CAD.  RHC showed mildly elevated right and left heart filling pressures and normal CI. SPEP negative and no infiltrative disease on cardiac MRI. Repeat  echo in 5/16 with EF 20-25%.  CPX showed limitations are related to lung disease as well as obesity, no HF limitation. Medtronic CRT-D placement.  Followup echo in 11/16 showed improvement in EF to 40-45%.  Echo was done today and reviewed, it showed EF 50-55%.  She is not volume overloaded on exam or by Optivol, NYHA class I-II.    - Continue Lasix 20 mg daily.  BMET today.   - Continue Coreg 12.5 mg bid.   - Continue Entresto to 97/103 bid.   - Continue spironolactone 25 mg daily.   - She will need BMET every 3 months to follow K and creatinine given her medication regimen.    2. Elevated right hemidiaphragm: This likely contributes to dyspnea.   3. OSA:  Has not been able to tolerate CPAP.    4. COPD: Severe by PFTs. No longer smokes.  5. Hyperlipidemia: LDL ok in 7/20.   Recommended follow-up:  6 months   Signed, Loralie Champagne, MD  09/09/2019  [place] [address] [facility] [place] [address] [phone] (office) [phone] (fax)

## 2019-09-13 ENCOUNTER — Other Ambulatory Visit: Payer: Self-pay | Admitting: Cardiology

## 2019-09-21 ENCOUNTER — Other Ambulatory Visit (HOSPITAL_COMMUNITY): Payer: Self-pay | Admitting: Cardiology

## 2019-09-23 ENCOUNTER — Other Ambulatory Visit: Payer: Self-pay | Admitting: Cardiology

## 2019-10-06 ENCOUNTER — Other Ambulatory Visit: Payer: Self-pay

## 2019-10-06 DIAGNOSIS — Z20822 Contact with and (suspected) exposure to covid-19: Secondary | ICD-10-CM

## 2019-10-08 ENCOUNTER — Telehealth: Payer: Self-pay | Admitting: Internal Medicine

## 2019-10-08 ENCOUNTER — Ambulatory Visit: Payer: Self-pay

## 2019-10-08 LAB — NOVEL CORONAVIRUS, NAA: SARS-CoV-2, NAA: NOT DETECTED

## 2019-10-08 NOTE — Telephone Encounter (Signed)
Results not available yet.

## 2019-10-08 NOTE — Telephone Encounter (Signed)
Negative COVID results given. Patient results "NOT Detected." Caller expressed understanding. ° °

## 2019-10-20 DIAGNOSIS — J45909 Unspecified asthma, uncomplicated: Secondary | ICD-10-CM | POA: Diagnosis not present

## 2019-10-26 ENCOUNTER — Ambulatory Visit
Admission: RE | Admit: 2019-10-26 | Discharge: 2019-10-26 | Disposition: A | Discharge status: Home / Self Care | Payer: Medicare HMO | Source: Ambulatory Visit | Attending: Internal Medicine | Admitting: Internal Medicine

## 2019-10-26 ENCOUNTER — Other Ambulatory Visit: Payer: Self-pay

## 2019-10-26 ENCOUNTER — Ambulatory Visit
Admission: RE | Admit: 2019-10-26 | Discharge: 2019-10-26 | Disposition: A | Discharge status: Home / Self Care | Payer: Medicare HMO | Source: Ambulatory Visit | Attending: Endocrinology | Admitting: Endocrinology

## 2019-10-26 ENCOUNTER — Ambulatory Visit
Admission: EM | Admit: 2019-10-26 | Discharge: 2019-10-26 | Disposition: A | Discharge status: Home / Self Care | Payer: Medicare HMO | Attending: Emergency Medicine | Admitting: Emergency Medicine

## 2019-10-26 DIAGNOSIS — E213 Hyperparathyroidism, unspecified: Secondary | ICD-10-CM

## 2019-10-26 DIAGNOSIS — Z7689 Persons encountering health services in other specified circumstances: Secondary | ICD-10-CM | POA: Diagnosis not present

## 2019-10-26 DIAGNOSIS — Z20828 Contact with and (suspected) exposure to other viral communicable diseases: Secondary | ICD-10-CM | POA: Diagnosis not present

## 2019-10-26 DIAGNOSIS — Z1231 Encounter for screening mammogram for malignant neoplasm of breast: Secondary | ICD-10-CM | POA: Diagnosis not present

## 2019-10-26 DIAGNOSIS — Z780 Asymptomatic menopausal state: Secondary | ICD-10-CM | POA: Diagnosis not present

## 2019-10-26 DIAGNOSIS — M8580 Other specified disorders of bone density and structure, unspecified site: Secondary | ICD-10-CM

## 2019-10-26 DIAGNOSIS — Z20822 Contact with and (suspected) exposure to covid-19: Secondary | ICD-10-CM

## 2019-10-26 DIAGNOSIS — N200 Calculus of kidney: Secondary | ICD-10-CM | POA: Diagnosis not present

## 2019-10-26 DIAGNOSIS — M8589 Other specified disorders of bone density and structure, multiple sites: Secondary | ICD-10-CM | POA: Diagnosis not present

## 2019-10-26 LAB — POCT URINALYSIS DIP (MANUAL ENTRY)
Glucose, UA: NEGATIVE mg/dL
Leukocytes, UA: NEGATIVE
Nitrite, UA: NEGATIVE
Protein Ur, POC: >=300 mg/dL — AB
Spec Grav, UA: >=1.03 — AB (ref 1.010–1.025)
Urobilinogen, UA: 0.2 E.U./dL
pH, UA: 5 (ref 5.0–8.0)

## 2019-10-26 MED ORDER — TAMSULOSIN HCL 0.4 MG PO CAPS: 0.4000 mg | ORAL_CAPSULE | Freq: Every day | ORAL | 0 refills | Status: AC

## 2019-10-26 MED ORDER — HYDROCODONE-ACETAMINOPHEN 5-325 MG PO TABS: 1.0000 | ORAL_TABLET | Freq: Four times a day (QID) | ORAL | 0 refills | Status: DC | PRN

## 2019-10-26 MED ORDER — KETOROLAC TROMETHAMINE 30 MG/ML IJ SOLN
30.0000 mg | Freq: Once | INTRAMUSCULAR | Status: AC
  Administered 2019-10-26: 19:00:00 30 mg via INTRAMUSCULAR

## 2019-10-26 MED ORDER — IBUPROFEN 600 MG PO TABS: 600.0000 mg | ORAL_TABLET | Freq: Four times a day (QID) | ORAL | 0 refills | Status: DC | PRN

## 2019-10-26 NOTE — Discharge Instructions (Addendum)
Make sure you drink extra fluids.  600 mg ibuprofen with a Tylenol containing product 3 or 4 times a day.  Either 1000 mg of Tylenol for mild to moderate pain, or 1-2 Norco for severe pain.  I have sent your urine off for culture to make sure you do not have a urinary tract infection. I will call you if you do, we will call in the appropriate antibiotics.

## 2019-10-26 NOTE — ED Triage Notes (Signed)
Pt presents to [facility] stating the home health agency for her mother needs a negative covid test from her before starting treatment. Pt denies symptoms at this time.  Pt c/o left flank pain starting today. Pt states she saw blood in her urine that last time she urinated.

## 2019-10-26 NOTE — ED Provider Notes (Signed)
HPI  SUBJECTIVE:  Stacy Hernandez is a 63 y.o. female who presents with acute onset of constant, stabbing knifelike left back and flank pain starting today.  States it starts in her left flank and radiates to her back.  She reports odorous urine, gross hematuria.  She has urinated 4-5 times today and states that she has had hematuria each time.  No nausea, vomiting, fevers, dysuria, urgency, frequency, vaginal bleeding.  She states symptoms are better when she gets up and walks, no aggravating factors.  She has not tried anything for this.  She states this feels similar to previous episodes of nephrolithiasis.  She is also here for a Covid test.  She denies any symptoms.  No body aches headaches nasal congestion sore throat, loss of sense of smell or taste, nausea, vomiting.  States that she takes care of her mother, and hospice will not come out unless both she and her mother have negative Covid tests.  Has a past medical history of CHF, UTI, nonobstructing nephrolithiasis coronary artery disease, hypertension per chart.  Denies history of chronic kidney disease, diabetes,  HIV, cancer, immunocompromise, GI bleed, peptic ulcer disease.  [id number], Thayer Jew, MD    Past Medical History:  Diagnosis Date  . AICD (automatic cardioverter/defibrillator) present 04/15/2015   Medtronic (serial Number [id number]) biventricular ICD  . Back pain    Recent positive ANA but told she has OA  . CAD (coronary artery disease)    a.  per cath in June 2013 with nonobstructive CAD;  b.  Lexiscan Myoview (12/15):  No ischemia.  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 Cardiomyopathy ([place])    a.  EF 45-50% by echo 04/23/2012;  b.  Echo (12/15):  EF 10-15%, Gr 2 DD, ventricular septum dyssynergy, mod MR, mod LAE.  Marland Kitchen COPD (chronic obstructive pulmonary disease) ([place])   . Hypercalcemia    Due to HCTZ, improved with d/c of calcium supplements  . Hypertension    . Hypertriglyceridemia   . Nephrolithiasis   . OA (osteoarthritis)   . Osteoporosis   . Sleep apnea   . Thyroid disease    Post-ablation hypothyroidism  . Tobacco abuse     Past Surgical History:  Procedure Laterality Date  . BIOPSY  08/08/2018   Procedure: BIOPSY;  Surgeon: Carol Ada, MD;  Location: [facility];  Service: Endoscopy;;  . BLADDER SURGERY     bladder & bowel tac with mesh  . CARPAL TUNNEL RELEASE    . CESAREAN SECTION    . EP IMPLANTABLE DEVICE N/A 04/15/2015   Procedure: BiV ICD Insertion CRT-D;  Surgeon: Evans Lance, MD;  Medtronic (serial Number [id number]) biventricular ICD  . ESOPHAGOGASTRODUODENOSCOPY (EGD) WITH PROPOFOL N/A 08/08/2018   Procedure: ESOPHAGOGASTRODUODENOSCOPY (EGD) WITH PROPOFOL;  Surgeon: Carol Ada, MD;  Location: [facility];  Service: Endoscopy;  Laterality: N/A;  . LEFT AND RIGHT HEART CATHETERIZATION WITH CORONARY ANGIOGRAM N/A 11/12/2014   Procedure: LEFT AND RIGHT HEART CATHETERIZATION WITH CORONARY ANGIOGRAM;  Surgeon: Larey Dresser, MD;  Location: [facility] CATH LAB;  Service: Cardiovascular;  Laterality: N/A;  . LEFT HEART CATHETERIZATION WITH CORONARY ANGIOGRAM N/A 04/22/2012   Procedure: LEFT HEART CATHETERIZATION WITH CORONARY ANGIOGRAM;  Surgeon: Hillary Bow, MD;  Location: [facility] CATH LAB;  Service: Cardiovascular;  Laterality: N/A;  . PARTIAL HYSTERECTOMY     Due to cervical dysplasia  . SAVORY DILATION N/A 08/08/2018   Procedure: SAVORY DILATION;  Surgeon: Carol Ada, MD;  Location: [facility];  Service: Endoscopy;  Laterality: N/A;  . TONSILLECTOMY      Family History  Problem Relation Age of Onset  . Coronary artery disease Father        s/p CABG in his 2s  . Hypertension Father   . Heart attack Father   . COPD Mother        smoked  . Colon polyps Mother   . Colon cancer Maternal Uncle 78  . Diabetes Sister   . Cirrhosis Sister   . Stroke Neg Hx     Social History   Tobacco Use  . Smoking status:  Former Smoker    Packs/day: 2.00    Years: 35.00    Pack years: 70.00    Types: Cigarettes    Quit date: 11/05/2014    Years since quitting: 4.9  . Smokeless tobacco: Never Used  Substance Use Topics  . Alcohol use: No    Alcohol/week: 0.0 standard drinks  . Drug use: No    No current facility-administered medications for this encounter.  Current Outpatient Medications:  .  aspirin 81 MG tablet, Take 81 mg by mouth at bedtime. , Disp: , Rfl:  .  atorvastatin (LIPITOR) 10 MG tablet, TAKE 1 TABLET BY MOUTH EVERYDAY AT BEDTIME, Disp: 90 tablet, Rfl: 2 .  augmented betamethasone dipropionate (DIPROLENE-AF) 0.05 % cream, APPLY TO AFFECTED AREA TWICE A DAY AS NEEDED (NOT TO FACE, GROIN, OR UNDERARMS), Disp: , Rfl:  .  carvedilol (COREG) 12.5 MG tablet, TAKE 1 TABLET (12.5 MG TOTAL) BY MOUTH 2 (TWO) TIMES DAILY., Disp: 180 tablet, Rfl: 1 .  cholecalciferol (VITAMIN D) 400 units TABS tablet, Take 400 Units by mouth daily., Disp: , Rfl:  .  ENTRESTO 97-103 MG, TAKE 1 TABLET BY MOUTH TWICE A DAY, Disp: 60 tablet, Rfl: 9 .  furosemide (LASIX) 20 MG tablet, Take 20 mg by mouth daily., Disp: , Rfl:  .  HYDROcodone-acetaminophen (NORCO/VICODIN) 5-325 MG tablet, Take 1-2 tablets by mouth every 6 (six) hours as needed for moderate pain., Disp: 12 tablet, Rfl: 0 .  ibuprofen (ADVIL) 600 MG tablet, Take 1 tablet (600 mg total) by mouth every 6 (six) hours as needed., Disp: 30 tablet, Rfl: 0 .  levothyroxine (SYNTHROID, LEVOTHROID) 100 MCG tablet, Take 100 mcg by mouth daily before breakfast., Disp: , Rfl:  .  Multiple Vitamins-Minerals (MULTIVITAMIN WITH MINERALS) tablet, Take 1 tablet by mouth daily., Disp: , Rfl:  .  niacin 500 MG tablet, Take 1,500 mg by mouth at bedtime., Disp: , Rfl:  .  nitroGLYCERIN (NITROSTAT) 0.4 MG SL tablet, Place 1 tablet (0.4 mg total) under the tongue every 5 (five) minutes as needed for chest pain., Disp: 25 tablet, Rfl: 3 .  Phenylephrine-APAP-guaiFENesin (MUCINEX SINUS-MAX  PO), Take 1 tablet by mouth daily as needed (sinuses)., Disp: , Rfl:  .  potassium chloride (KLOR-CON) 10 MEQ tablet, TAKE 1 TABLET BY MOUTH EVERY DAY, Disp: 90 tablet, Rfl: 1 .  spironolactone (ALDACTONE) 25 MG tablet, TAKE 1 TABLET BY MOUTH EVERY DAY, Disp: 90 tablet, Rfl: 1 .  tamsulosin (FLOMAX) 0.4 MG CAPS capsule, Take 1 capsule (0.4 mg total) by mouth at bedtime for 7 days., Disp: 7 capsule, Rfl: 0 .  traMADol (ULTRAM) 50 MG tablet, Take 50 mg by mouth every evening., Disp: , Rfl:   Allergies  Allergen Reactions  . Prednisone Nausea Only     ROS  As noted in HPI.   Physical  Exam  BP (!) 143/91 (BP Location: Right Arm)   Pulse 74   Temp 98 F (36.7 C) (Oral)   Resp 18   SpO2 98%     Constitutional: Well developed, well nourished, appears uncomfortable Eyes:  EOMI, conjunctiva normal bilaterally HENT: Normocephalic, atraumatic,mucus membranes moist Respiratory: Normal inspiratory effort Cardiovascular: Normal rate GI: nondistended soft.  Positive tenderness at left UVJ, left flank, no guarding, rebound.  No distention.  Active bowel sounds.  Back: Left CVAT. skin: No rash, skin intact Musculoskeletal: no deformities Neurologic: Alert & oriented x 3, no focal neuro deficits Psychiatric: Speech and behavior appropriate   ED Course   Medications  ketorolac (TORADOL) 30 MG/ML injection 30 mg (30 mg Intramuscular Given 10/26/19 1926)    Orders Placed This Encounter  Procedures  . Novel Coronavirus, NAA ([facility])    Standing Status:   Standing    Number of Occurrences:   1  . Urine Culture    Standing Status:   Standing    Number of Occurrences:   1    Order Specific Question:   Patient immune status    Answer:   Normal  . POCT urinalysis dipstick    Standing Status:   Standing    Number of Occurrences:   1    Results for orders placed or performed during the hospital encounter of 10/26/19 (from the past 24 hour(s))  POCT urinalysis dipstick     Status:  Abnormal   Collection Time: 10/26/19  5:57 PM  Result Value Ref Range   Color, UA red (A) yellow   Clarity, UA hazy (A) clear   Glucose, UA negative negative mg/dL   Bilirubin, UA small (A) negative   Ketones, POC UA trace (5) (A) negative mg/dL   Spec Grav, UA >=1.030 (A) 1.010 - 1.025   Blood, UA large (A) negative   pH, UA 5.0 5.0 - 8.0   Protein Ur, POC >=300 (A) negative mg/dL   Urobilinogen, UA 0.2 0.2 or 1.0 E.U./dL   Nitrite, UA Negative Negative   Leukocytes, UA Negative Negative   DG Bone Density  Result Date: 10/26/2019 EXAM: DUAL X-RAY ABSORPTIOMETRY (DXA) FOR BONE MINERAL DENSITY IMPRESSION: Referring Physician:  Jacelyn Pi Your patient completed a BMD test using Lunar IDXA DXA system ( analysis version: 16 ) manufactured by [employer]. Technologist: AW PATIENT: Name: Real, David Patient ID: [id number] Birth Date: 1956/05/02 Height: 62.0 in. Sex: Female Measured: 10/26/2019 Weight: 173.4 lbs. Indications: Caucasian, Estrogen Deficient, Height Loss (781.91), High risk medication use, Hx of tobacco use, Hypothyroid, Hysterectomy, Postmenopausal, Secondary Osteoporosis, Synthroid Fractures: None Treatments: Multivitamin, Vitamin D (E933.5) ASSESSMENT: The BMD measured at AP Spine L2-L4 is 1.030 g/cm2 with a T-score of -1.5. This patient is considered osteopenic according to [facility]) criteria. There has been no statistically significant change in BMD of Total Mean hip since prior exam dated 10/10/2017. The scan quality is good. L-1 was excluded due to degenerative changes. Site Region Measured Date Measured Age YA BMD Significant CHANGE T-score AP Spine  L2-L4      10[age]26/2019    63.1         -1.5    1.030 g/cm2 AP Spine  L2-L4      10/10/2017    61.1         -1.5    1.026 g/cm2 DualFemur Neck Left 10/26/2019 63.1 -1.3 0.861 g/cm2 * DualFemur Neck Left  10/10/2017    61.1         -  1.7    0.799 g/cm2 DualFemur Total Mean 10/26/2019    63.1         -0.6     0.937 g/cm2 DualFemur Total Mean 10/10/2017    61.1         -0.5    0.939 g/cm2 World Health Organization [facility]) criteria for post-menopausal, Caucasian Women: Normal       T-score at or above -1 SD Osteopenia   T-score between -1 and -2.5 SD Osteoporosis T-score at or below -2.5 SD RECOMMENDATION: 1. All patients should optimize calcium and vitamin D intake. 2. Consider FDA approved medical therapies in postmenopausal women and men aged 54 years and older, based on the following: a. A hip or vertebral (clinical or morphometric) fracture b. T- score < or = -2.5 at the femoral neck or spine after appropriate evaluation to exclude secondary causes c. Low bone mass (T-score between -1.0 and -2.5 at the femoral neck or spine) and a 10 year probability of a hip fracture > or = 3% or a 10 year probability of a major osteoporosis-related fracture > or = 20% based on the [place]-adapted WHO algorithm d. Clinician judgment and/or patient preferences may indicate treatment for people with 10-year fracture probabilities above or below these levels FOLLOW-UP: People with diagnosed cases of osteoporosis or at high risk for fracture should have regular bone mineral density tests. For patients eligible for Medicare, routine testing is allowed once every 2 years. The testing frequency can be increased to one year for patients who have rapidly progressing disease, those who are receiving or discontinuing medical therapy to restore bone mass, or have additional risk factors. I have reviewed this report and agree with the above findings. [facility] FRAX* 10-year Probability of Fracture Based on femoral neck BMD: DualFemur (Left) Major Osteoporotic Fracture: 7.7% Hip Fracture:                0.6% Population:                  [place] (Caucasian) Risk Factors:                Secondary Osteoporosis *FRAX is a Materials engineer of the [employer] for Metabolic Bone Disease, a [place] (WHO)  [facility]. ASSESSMENT: The probability of a major osteoporotic fracture is 7.7 % within the next ten years. The probability of a hip fracture is 0.6 % within the next ten years. Electronically Signed   By: Lowella Grip III M.D.   On: 10/26/2019 10:54    ED Clinical Impression  1. Nephrolithiasis   2. Encounter for laboratory testing for COVID-19 virus      ED Assessment/Plan  1.  Suspected nephrolithiasis/hematuria.  No h/o obstruction.  Giving 30 mg Toradol here.  Home with ibuprofen 600 mg with Tylenol containing product 3 or 4 times a day.  1000 mg Tylenol for mild to moderate pain, Norco for severe pain.  Flomax.  Urine sent off for culture to confirm absence of UTI.  Strainer.  Follow-up with [facility] in [place].  To the ER if she gets worse.  2.  Covid testing.  Patient asymptomatic.  PCR sent.  [place] Narcotic database reviewed for this patient, and feel that the risk/benefit ratio today is favorable for proceeding with a prescription for controlled substance.   Patient prescribed 90 tramadol 12/17.  Has been getting from same provider over the past year.  Patient states Tylenol does not work for  nephrolithiasis..  Advised to not take the tramadol with taking the Norco.  Discussed labs MDM, treatment plan, and plan for follow-up with patient. Discussed sn/sx that should prompt return to the ED. patient agrees with plan.   Meds ordered this encounter  Medications  . ketorolac (TORADOL) 30 MG/ML injection 30 mg  . tamsulosin (FLOMAX) 0.4 MG CAPS capsule    Sig: Take 1 capsule (0.4 mg total) by mouth at bedtime for 7 days.    Dispense:  7 capsule    Refill:  0  . ibuprofen (ADVIL) 600 MG tablet    Sig: Take 1 tablet (600 mg total) by mouth every 6 (six) hours as needed.    Dispense:  30 tablet    Refill:  0  . HYDROcodone-acetaminophen (NORCO/VICODIN) 5-325 MG tablet    Sig: Take 1-2 tablets by mouth every 6 (six) hours as needed for moderate pain.     Dispense:  12 tablet    Refill:  0    *This clinic note was created using [employer]. Therefore, there may be occasional mistakes despite careful proofreading.   ?    Melynda Ripple, MD 10/27/19 1045

## 2019-10-27 ENCOUNTER — Other Ambulatory Visit: Payer: Medicare HMO

## 2019-10-27 LAB — NOVEL CORONAVIRUS, NAA: SARS-CoV-2, NAA: NOT DETECTED

## 2019-10-28 LAB — URINE CULTURE
Culture: NO GROWTH
Special Requests: NORMAL

## 2019-11-19 ENCOUNTER — Ambulatory Visit (INDEPENDENT_AMBULATORY_CARE_PROVIDER_SITE_OTHER): Payer: Medicare HMO | Admitting: *Deleted

## 2019-11-19 DIAGNOSIS — Z95810 Presence of automatic (implantable) cardiac defibrillator: Secondary | ICD-10-CM | POA: Diagnosis not present

## 2019-11-19 LAB — CUP PACEART REMOTE DEVICE CHECK
Battery Remaining Longevity: 47 mo
Battery Voltage: 2.97 V
Brady Statistic AP VP Percent: 17.47 %
Brady Statistic AP VS Percent: 0.28 %
Brady Statistic AS VP Percent: 80.91 %
Brady Statistic AS VS Percent: 1.34 %
Brady Statistic RA Percent Paced: 17.58 %
Brady Statistic RV Percent Paced: 91.45 %
Date Time Interrogation Session: 20210114012506
HighPow Impedance: 75 Ohm
Implantable Lead Implant Date: 20160610
Implantable Lead Implant Date: 20160610
Implantable Lead Implant Date: 20160610
Implantable Lead Location: 753858
Implantable Lead Location: 753859
Implantable Lead Location: 753860
Implantable Lead Model: 4396
Implantable Lead Model: 5076
Implantable Pulse Generator Implant Date: 20160610
Lead Channel Impedance Value: 361 Ohm
Lead Channel Impedance Value: 361 Ohm
Lead Channel Impedance Value: 456 Ohm
Lead Channel Impedance Value: 551 Ohm
Lead Channel Impedance Value: 608 Ohm
Lead Channel Impedance Value: 760 Ohm
Lead Channel Pacing Threshold Amplitude: 0.375 V
Lead Channel Pacing Threshold Amplitude: 0.625 V
Lead Channel Pacing Threshold Amplitude: 1.125 V
Lead Channel Pacing Threshold Pulse Width: 0.4 ms
Lead Channel Pacing Threshold Pulse Width: 0.4 ms
Lead Channel Pacing Threshold Pulse Width: 0.4 ms
Lead Channel Sensing Intrinsic Amplitude: 14 mV
Lead Channel Sensing Intrinsic Amplitude: 14 mV
Lead Channel Sensing Intrinsic Amplitude: 2.125 mV
Lead Channel Sensing Intrinsic Amplitude: 2.125 mV
Lead Channel Setting Pacing Amplitude: 1.5 V
Lead Channel Setting Pacing Amplitude: 2 V
Lead Channel Setting Pacing Amplitude: 2.25 V
Lead Channel Setting Pacing Pulse Width: 0.4 ms
Lead Channel Setting Pacing Pulse Width: 0.4 ms
Lead Channel Setting Sensing Sensitivity: 0.3 mV

## 2019-11-20 NOTE — Progress Notes (Signed)
ICD remote 

## 2019-11-23 DIAGNOSIS — M150 Primary generalized (osteo)arthritis: Secondary | ICD-10-CM | POA: Diagnosis not present

## 2019-11-23 DIAGNOSIS — M5136 Other intervertebral disc degeneration, lumbar region: Secondary | ICD-10-CM | POA: Diagnosis not present

## 2019-11-23 DIAGNOSIS — M549 Dorsalgia, unspecified: Secondary | ICD-10-CM | POA: Diagnosis not present

## 2019-11-23 DIAGNOSIS — M1990 Unspecified osteoarthritis, unspecified site: Secondary | ICD-10-CM | POA: Diagnosis not present

## 2019-11-23 DIAGNOSIS — I429 Cardiomyopathy, unspecified: Secondary | ICD-10-CM | POA: Diagnosis not present

## 2019-11-26 DIAGNOSIS — R300 Dysuria: Secondary | ICD-10-CM | POA: Diagnosis not present

## 2019-11-26 DIAGNOSIS — N390 Urinary tract infection, site not specified: Secondary | ICD-10-CM | POA: Diagnosis not present

## 2019-12-08 ENCOUNTER — Other Ambulatory Visit (HOSPITAL_COMMUNITY): Payer: Self-pay | Admitting: Cardiology

## 2019-12-08 ENCOUNTER — Other Ambulatory Visit: Payer: Self-pay | Admitting: Internal Medicine

## 2019-12-09 ENCOUNTER — Other Ambulatory Visit: Payer: Self-pay

## 2019-12-09 ENCOUNTER — Ambulatory Visit (HOSPITAL_COMMUNITY)
Admission: RE | Admit: 2019-12-09 | Discharge: 2019-12-09 | Disposition: A | Discharge status: Home / Self Care | Payer: Medicare HMO | Source: Ambulatory Visit | Attending: Internal Medicine | Admitting: Internal Medicine

## 2019-12-09 DIAGNOSIS — I5022 Chronic systolic (congestive) heart failure: Secondary | ICD-10-CM | POA: Insufficient documentation

## 2019-12-09 LAB — BASIC METABOLIC PANEL
Anion gap: 3 — ABNORMAL LOW (ref 5–15)
BUN: 14 mg/dL (ref 8–23)
CO2: 25 mmol/L (ref 22–32)
Calcium: 9.3 mg/dL (ref 8.9–10.3)
Chloride: 111 mmol/L (ref 98–111)
Creatinine, Ser: 0.96 mg/dL (ref 0.44–1.00)
GFR calc Af Amer: >60 mL/min (ref >60)
GFR calc non Af Amer: >60 mL/min (ref >60)
Glucose, Bld: 102 mg/dL — ABNORMAL HIGH (ref 70–99)
Potassium: 3.9 mmol/L (ref 3.5–5.1)
Sodium: 139 mmol/L (ref 135–145)

## 2019-12-10 DIAGNOSIS — E7800 Pure hypercholesterolemia, unspecified: Secondary | ICD-10-CM | POA: Diagnosis not present

## 2019-12-10 DIAGNOSIS — Z0000 Encounter for general adult medical examination without abnormal findings: Secondary | ICD-10-CM | POA: Diagnosis not present

## 2019-12-10 DIAGNOSIS — E890 Postprocedural hypothyroidism: Secondary | ICD-10-CM | POA: Diagnosis not present

## 2019-12-10 DIAGNOSIS — I10 Essential (primary) hypertension: Secondary | ICD-10-CM | POA: Diagnosis not present

## 2019-12-10 DIAGNOSIS — E559 Vitamin D deficiency, unspecified: Secondary | ICD-10-CM | POA: Diagnosis not present

## 2019-12-17 DIAGNOSIS — E559 Vitamin D deficiency, unspecified: Secondary | ICD-10-CM | POA: Diagnosis not present

## 2019-12-17 DIAGNOSIS — I509 Heart failure, unspecified: Secondary | ICD-10-CM | POA: Diagnosis not present

## 2019-12-17 DIAGNOSIS — I10 Essential (primary) hypertension: Secondary | ICD-10-CM | POA: Diagnosis not present

## 2019-12-17 DIAGNOSIS — E7800 Pure hypercholesterolemia, unspecified: Secondary | ICD-10-CM | POA: Diagnosis not present

## 2019-12-17 DIAGNOSIS — Z95810 Presence of automatic (implantable) cardiac defibrillator: Secondary | ICD-10-CM | POA: Diagnosis not present

## 2019-12-28 ENCOUNTER — Other Ambulatory Visit (HOSPITAL_COMMUNITY): Payer: Self-pay | Admitting: Cardiology

## 2020-01-30 DIAGNOSIS — Z03818 Encounter for observation for suspected exposure to other biological agents ruled out: Secondary | ICD-10-CM | POA: Diagnosis not present

## 2020-01-30 DIAGNOSIS — Z20828 Contact with and (suspected) exposure to other viral communicable diseases: Secondary | ICD-10-CM | POA: Diagnosis not present

## 2020-02-15 ENCOUNTER — Other Ambulatory Visit (HOSPITAL_COMMUNITY): Payer: Self-pay

## 2020-02-15 MED ORDER — ENTRESTO 97-103 MG PO TABS: 1.0000 | ORAL_TABLET | Freq: Two times a day (BID) | ORAL | 3 refills | Status: DC

## 2020-02-16 ENCOUNTER — Other Ambulatory Visit (HOSPITAL_COMMUNITY): Payer: Self-pay

## 2020-02-16 MED ORDER — SPIRONOLACTONE 25 MG PO TABS: 25.0000 mg | ORAL_TABLET | Freq: Every day | ORAL | 3 refills | Status: DC

## 2020-02-17 LAB — CUP PACEART REMOTE DEVICE CHECK
Battery Remaining Longevity: 43 mo
Battery Voltage: 2.97 V
Brady Statistic AP VP Percent: 13.61 %
Brady Statistic AP VS Percent: 0.22 %
Brady Statistic AS VP Percent: 84.83 %
Brady Statistic AS VS Percent: 1.33 %
Brady Statistic RA Percent Paced: 13.73 %
Brady Statistic RV Percent Paced: 93.68 %
Date Time Interrogation Session: 20210414091105
HighPow Impedance: 61 Ohm
Implantable Lead Implant Date: 20160610
Implantable Lead Implant Date: 20160610
Implantable Lead Implant Date: 20160610
Implantable Lead Location: 753858
Implantable Lead Location: 753859
Implantable Lead Location: 753860
Implantable Lead Model: 4396
Implantable Lead Model: 5076
Implantable Pulse Generator Implant Date: 20160610
Lead Channel Impedance Value: 361 Ohm
Lead Channel Impedance Value: 361 Ohm
Lead Channel Impedance Value: 399 Ohm
Lead Channel Impedance Value: 532 Ohm
Lead Channel Impedance Value: 589 Ohm
Lead Channel Impedance Value: 665 Ohm
Lead Channel Pacing Threshold Amplitude: 0.375 V
Lead Channel Pacing Threshold Amplitude: 0.625 V
Lead Channel Pacing Threshold Amplitude: 1.25 V
Lead Channel Pacing Threshold Pulse Width: 0.4 ms
Lead Channel Pacing Threshold Pulse Width: 0.4 ms
Lead Channel Pacing Threshold Pulse Width: 0.4 ms
Lead Channel Sensing Intrinsic Amplitude: 13.625 mV
Lead Channel Sensing Intrinsic Amplitude: 13.625 mV
Lead Channel Sensing Intrinsic Amplitude: 2 mV
Lead Channel Sensing Intrinsic Amplitude: 2 mV
Lead Channel Setting Pacing Amplitude: 1.5 V
Lead Channel Setting Pacing Amplitude: 2 V
Lead Channel Setting Pacing Amplitude: 2.25 V
Lead Channel Setting Pacing Pulse Width: 0.4 ms
Lead Channel Setting Pacing Pulse Width: 0.4 ms
Lead Channel Setting Sensing Sensitivity: 0.3 mV

## 2020-02-18 ENCOUNTER — Ambulatory Visit (INDEPENDENT_AMBULATORY_CARE_PROVIDER_SITE_OTHER): Payer: Medicare HMO | Admitting: *Deleted

## 2020-02-18 DIAGNOSIS — Z95810 Presence of automatic (implantable) cardiac defibrillator: Secondary | ICD-10-CM | POA: Diagnosis not present

## 2020-02-18 NOTE — Progress Notes (Signed)
ICD Remote  

## 2020-05-15 ENCOUNTER — Other Ambulatory Visit: Payer: Self-pay | Admitting: Cardiology

## 2020-05-19 ENCOUNTER — Ambulatory Visit (INDEPENDENT_AMBULATORY_CARE_PROVIDER_SITE_OTHER): Payer: Medicare HMO | Admitting: *Deleted

## 2020-05-19 ENCOUNTER — Telehealth: Payer: Self-pay

## 2020-05-19 DIAGNOSIS — I5022 Chronic systolic (congestive) heart failure: Secondary | ICD-10-CM

## 2020-05-19 DIAGNOSIS — I429 Cardiomyopathy, unspecified: Secondary | ICD-10-CM

## 2020-05-19 LAB — CUP PACEART REMOTE DEVICE CHECK
Battery Remaining Longevity: 39 mo
Battery Voltage: 2.96 V
Brady Statistic AP VP Percent: 14.34 %
Brady Statistic AP VS Percent: 0.26 %
Brady Statistic AS VP Percent: 84.2 %
Brady Statistic AS VS Percent: 1.21 %
Brady Statistic RA Percent Paced: 14.56 %
Brady Statistic RV Percent Paced: 92.43 %
Date Time Interrogation Session: 20210715053327
HighPow Impedance: 67 Ohm
Implantable Lead Implant Date: 20160610
Implantable Lead Implant Date: 20160610
Implantable Lead Implant Date: 20160610
Implantable Lead Location: 753858
Implantable Lead Location: 753859
Implantable Lead Location: 753860
Implantable Lead Model: 4396
Implantable Lead Model: 5076
Implantable Pulse Generator Implant Date: 20160610
Lead Channel Impedance Value: 361 Ohm
Lead Channel Impedance Value: 361 Ohm
Lead Channel Impedance Value: 456 Ohm
Lead Channel Impedance Value: 532 Ohm
Lead Channel Impedance Value: 589 Ohm
Lead Channel Impedance Value: 703 Ohm
Lead Channel Pacing Threshold Amplitude: 0.5 V
Lead Channel Pacing Threshold Amplitude: 0.5 V
Lead Channel Pacing Threshold Amplitude: 1.375 V
Lead Channel Pacing Threshold Pulse Width: 0.4 ms
Lead Channel Pacing Threshold Pulse Width: 0.4 ms
Lead Channel Pacing Threshold Pulse Width: 0.4 ms
Lead Channel Sensing Intrinsic Amplitude: 13.625 mV
Lead Channel Sensing Intrinsic Amplitude: 13.625 mV
Lead Channel Sensing Intrinsic Amplitude: 2 mV
Lead Channel Sensing Intrinsic Amplitude: 2 mV
Lead Channel Setting Pacing Amplitude: 1.5 V
Lead Channel Setting Pacing Amplitude: 2 V
Lead Channel Setting Pacing Amplitude: 2.5 V
Lead Channel Setting Pacing Pulse Width: 0.4 ms
Lead Channel Setting Pacing Pulse Width: 0.4 ms
Lead Channel Setting Sensing Sensitivity: 0.3 mV

## 2020-05-19 NOTE — Telephone Encounter (Signed)
I let the pt know we got her transmission. I told her the monitor is programmed to send automatically. All shed has to do is sleep by the monitor and it will send the transmission for her.

## 2020-05-20 NOTE — Progress Notes (Signed)
Remote ICD transmission.   

## 2020-05-27 ENCOUNTER — Encounter (HOSPITAL_COMMUNITY): Payer: Medicare HMO | Admitting: Cardiology

## 2020-05-30 DIAGNOSIS — M150 Primary generalized (osteo)arthritis: Secondary | ICD-10-CM | POA: Diagnosis not present

## 2020-05-30 DIAGNOSIS — M549 Dorsalgia, unspecified: Secondary | ICD-10-CM | POA: Diagnosis not present

## 2020-05-30 DIAGNOSIS — M1990 Unspecified osteoarthritis, unspecified site: Secondary | ICD-10-CM | POA: Diagnosis not present

## 2020-05-30 DIAGNOSIS — I429 Cardiomyopathy, unspecified: Secondary | ICD-10-CM | POA: Diagnosis not present

## 2020-05-30 DIAGNOSIS — M5136 Other intervertebral disc degeneration, lumbar region: Secondary | ICD-10-CM | POA: Diagnosis not present

## 2020-06-07 DIAGNOSIS — Z20822 Contact with and (suspected) exposure to covid-19: Secondary | ICD-10-CM | POA: Diagnosis not present

## 2020-06-07 DIAGNOSIS — Z03818 Encounter for observation for suspected exposure to other biological agents ruled out: Secondary | ICD-10-CM | POA: Diagnosis not present

## 2020-06-14 ENCOUNTER — Other Ambulatory Visit (HOSPITAL_COMMUNITY): Payer: Self-pay | Admitting: Cardiology

## 2020-06-16 DIAGNOSIS — E559 Vitamin D deficiency, unspecified: Secondary | ICD-10-CM | POA: Diagnosis not present

## 2020-06-16 DIAGNOSIS — E213 Hyperparathyroidism, unspecified: Secondary | ICD-10-CM | POA: Diagnosis not present

## 2020-06-16 DIAGNOSIS — Z0000 Encounter for general adult medical examination without abnormal findings: Secondary | ICD-10-CM | POA: Diagnosis not present

## 2020-06-16 DIAGNOSIS — E7800 Pure hypercholesterolemia, unspecified: Secondary | ICD-10-CM | POA: Diagnosis not present

## 2020-06-21 DIAGNOSIS — E875 Hyperkalemia: Secondary | ICD-10-CM | POA: Diagnosis not present

## 2020-06-21 DIAGNOSIS — M5136 Other intervertebral disc degeneration, lumbar region: Secondary | ICD-10-CM | POA: Diagnosis not present

## 2020-06-21 DIAGNOSIS — I429 Cardiomyopathy, unspecified: Secondary | ICD-10-CM | POA: Diagnosis not present

## 2020-06-21 DIAGNOSIS — Z0000 Encounter for general adult medical examination without abnormal findings: Secondary | ICD-10-CM | POA: Diagnosis not present

## 2020-06-21 DIAGNOSIS — I10 Essential (primary) hypertension: Secondary | ICD-10-CM | POA: Diagnosis not present

## 2020-06-21 DIAGNOSIS — E039 Hypothyroidism, unspecified: Secondary | ICD-10-CM | POA: Diagnosis not present

## 2020-06-21 DIAGNOSIS — E7800 Pure hypercholesterolemia, unspecified: Secondary | ICD-10-CM | POA: Diagnosis not present

## 2020-06-29 DIAGNOSIS — M859 Disorder of bone density and structure, unspecified: Secondary | ICD-10-CM | POA: Diagnosis not present

## 2020-06-29 DIAGNOSIS — E7800 Pure hypercholesterolemia, unspecified: Secondary | ICD-10-CM | POA: Diagnosis not present

## 2020-06-29 DIAGNOSIS — E039 Hypothyroidism, unspecified: Secondary | ICD-10-CM | POA: Diagnosis not present

## 2020-06-29 DIAGNOSIS — E213 Hyperparathyroidism, unspecified: Secondary | ICD-10-CM | POA: Diagnosis not present

## 2020-06-29 DIAGNOSIS — E890 Postprocedural hypothyroidism: Secondary | ICD-10-CM | POA: Diagnosis not present

## 2020-06-29 DIAGNOSIS — E559 Vitamin D deficiency, unspecified: Secondary | ICD-10-CM | POA: Diagnosis not present

## 2020-06-29 DIAGNOSIS — I10 Essential (primary) hypertension: Secondary | ICD-10-CM | POA: Diagnosis not present

## 2020-07-07 DIAGNOSIS — E039 Hypothyroidism, unspecified: Secondary | ICD-10-CM | POA: Diagnosis not present

## 2020-07-07 DIAGNOSIS — Z0100 Encounter for examination of eyes and vision without abnormal findings: Secondary | ICD-10-CM | POA: Diagnosis not present

## 2020-07-07 DIAGNOSIS — E890 Postprocedural hypothyroidism: Secondary | ICD-10-CM | POA: Diagnosis not present

## 2020-07-07 DIAGNOSIS — H52229 Regular astigmatism, unspecified eye: Secondary | ICD-10-CM | POA: Diagnosis not present

## 2020-07-07 DIAGNOSIS — E213 Hyperparathyroidism, unspecified: Secondary | ICD-10-CM | POA: Diagnosis not present

## 2020-07-26 ENCOUNTER — Encounter (HOSPITAL_COMMUNITY): Payer: Medicare HMO | Admitting: Cardiology

## 2020-08-08 ENCOUNTER — Encounter (HOSPITAL_COMMUNITY): Payer: Self-pay | Admitting: Cardiology

## 2020-08-08 ENCOUNTER — Other Ambulatory Visit: Payer: Self-pay

## 2020-08-08 ENCOUNTER — Ambulatory Visit (HOSPITAL_COMMUNITY)
Admission: RE | Admit: 2020-08-08 | Discharge: 2020-08-08 | Disposition: A | Discharge status: Home / Self Care | Payer: Medicare HMO | Source: Ambulatory Visit | Attending: Cardiology | Admitting: Cardiology

## 2020-08-08 VITALS — BP 106/68 | HR 72 | Ht 62.0 in | Wt 168.6 lb

## 2020-08-08 DIAGNOSIS — I2510 Atherosclerotic heart disease of native coronary artery without angina pectoris: Secondary | ICD-10-CM | POA: Diagnosis not present

## 2020-08-08 DIAGNOSIS — J449 Chronic obstructive pulmonary disease, unspecified: Secondary | ICD-10-CM | POA: Diagnosis not present

## 2020-08-08 DIAGNOSIS — I447 Left bundle-branch block, unspecified: Secondary | ICD-10-CM | POA: Diagnosis not present

## 2020-08-08 DIAGNOSIS — I428 Other cardiomyopathies: Secondary | ICD-10-CM | POA: Diagnosis not present

## 2020-08-08 DIAGNOSIS — I110 Hypertensive heart disease with heart failure: Secondary | ICD-10-CM | POA: Diagnosis not present

## 2020-08-08 DIAGNOSIS — E669 Obesity, unspecified: Secondary | ICD-10-CM | POA: Diagnosis not present

## 2020-08-08 DIAGNOSIS — Z87891 Personal history of nicotine dependence: Secondary | ICD-10-CM | POA: Insufficient documentation

## 2020-08-08 DIAGNOSIS — Z79890 Hormone replacement therapy (postmenopausal): Secondary | ICD-10-CM | POA: Insufficient documentation

## 2020-08-08 DIAGNOSIS — G4733 Obstructive sleep apnea (adult) (pediatric): Secondary | ICD-10-CM | POA: Diagnosis not present

## 2020-08-08 DIAGNOSIS — E785 Hyperlipidemia, unspecified: Secondary | ICD-10-CM | POA: Insufficient documentation

## 2020-08-08 DIAGNOSIS — Z7982 Long term (current) use of aspirin: Secondary | ICD-10-CM | POA: Diagnosis not present

## 2020-08-08 DIAGNOSIS — Z6830 Body mass index (BMI) 30.0-30.9, adult: Secondary | ICD-10-CM | POA: Insufficient documentation

## 2020-08-08 DIAGNOSIS — E039 Hypothyroidism, unspecified: Secondary | ICD-10-CM | POA: Insufficient documentation

## 2020-08-08 DIAGNOSIS — Z79899 Other long term (current) drug therapy: Secondary | ICD-10-CM | POA: Diagnosis not present

## 2020-08-08 DIAGNOSIS — I5022 Chronic systolic (congestive) heart failure: Secondary | ICD-10-CM | POA: Diagnosis not present

## 2020-08-08 LAB — BASIC METABOLIC PANEL WITH GFR
Anion gap: 10 (ref 5–15)
BUN: 12 mg/dL (ref 8–23)
CO2: 25 mmol/L (ref 22–32)
Calcium: 9.9 mg/dL (ref 8.9–10.3)
Chloride: 105 mmol/L (ref 98–111)
Creatinine, Ser: 0.95 mg/dL (ref 0.44–1.00)
GFR calc Af Amer: >60 mL/min
GFR calc non Af Amer: >60 mL/min
Glucose, Bld: 124 mg/dL — ABNORMAL HIGH (ref 70–99)
Potassium: 3.7 mmol/L (ref 3.5–5.1)
Sodium: 140 mmol/L (ref 135–145)

## 2020-08-08 NOTE — Patient Instructions (Addendum)
Labs done today, your results will be available in MyChart, we will contact you for abnormal readings.  Your physician recommends that you return for labs in 3 months. Please call our office in January to schedule this appointment  Please call our office in March 2022 to schedule your follow up appointment  If you have any questions or concerns before your next appointment please send [place] a message through [place] or call our office at [phone].    TO LEAVE A MESSAGE FOR THE NURSE SELECT OPTION 2, PLEASE LEAVE A MESSAGE INCLUDING: . YOUR NAME . DATE OF BIRTH . CALL BACK NUMBER . REASON FOR CALL**this is important as we prioritize the call backs  YOU WILL RECEIVE A CALL BACK THE SAME DAY AS LONG AS YOU CALL BEFORE 4:00 PM

## 2020-08-09 NOTE — Progress Notes (Signed)
Date:  08/09/2020   ID:  Stacy Hernandez, DOB 1956-04-28, MRN [id number]   Provider location: [facility] [facility] Type of Visit: Established patient  PCP:  Deland Pretty, MD  Cardiologist: Dr. Aundra Dubin  Chief Complaint: Shortness of breath   History of Present Illness: Stacy Hernandez is a 64 y.o. female who has a history of HTN, intermittent LBBB, and nonischemic cardiomyopathy.  She was admitted with chest pain in 6/13.  ECG showed intermittent LBBB.  Echo showed EF 45-50%.  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    Developed CP and dyspnea. She was set up for echo and Cardiolite in 12/15.  Echo showed EF 10-15% (significant fall).  She had a Lexiscan Cardiolite with EF 25% and apical anteroseptal and apical fixed perfusion defect.  Therefore, she was sent for left and right heart catheterization in 1/16.  This showed mild elevation in LV and RV filling pressures and no significant CAD.  She was started on Lasix 20 mg daily.  Echo in 5/16 showed EF 20-25%, mildly dilated and mildly dysfunctional RV.  PFTs 4/16 showed severe obstruction and restriction.  She has seen Dr Melvyn Novas.  She is noted to have an elevated right hemidiaphragm.   In 6/16, she had Medtronic CRT-D system placed.  In 7/16, CPX showed low normal functional capacity with the main limitation being pulmonary.  Repeat echo in 11/16 showed improvement in EF to 40-45%.  Echo in 7/18 showed stable EF 40-45%.  Echo in 7/19 showed EF up to 50-55%.  Echo was done today and reviewed, EF 50-55% with mildly decreased RV systolic function.   She returns today for followup of CHF.  Weight is down 9 lbs.  No significant exertional dyspnea.  No chest pain.  K was elevated recently and she stopped her K supplement.    MDT device interrogation: >99% BiV pacing, fluid index > threshold but this appears to be chronic and suspect inaccurate.   ECG (personally reviewed): NSR, BiV paced   Labs (12/13): LDL 80, HDL  46, K 4, creatinine 0.7 Labs (6/14): K 4, creatinine 0.8, LDL 73, HDL 45 Labs (12/15): K 4.2, creatinine 0.7, HCT 43.3, LDL 60, HDL 51, BNP 577, TSH normal Labs (2/16): K 4.2, creatinine 0.7 Labs (1/16): SPEP no M spike K 4.3 Creatinine 0.74  Labs (2/16): K 4.2, creatinine 0.78 Labs (02/23/2015) : K 4.2 Creatinine 0.90, BNP 339 Labs (04/13/2015): K 4.5 Creatinine 1.01  Labs (05/17/2015): K 4.7 Creatinine 0.71  Labs (11/16): K 4.1, creatinine 0.74 Labs (6/17): K 4.2, creatinine 0.85, BNP 76 Labs (1/18): LDL 55, HDL 42 Labs (7/18): K 3.9, creatinine 0.91 Labs (1/19): K 4, creatinine 1.0, LDL 59, hgb 13.7 Labs (10/19): LDL 70 Labs (1/20): K 4.2, creatinine 0.96 Labs (7/20): K 4, creatinine 1.1, LDL 76, hgb 13.2 Labs (8/21): K 5.5, creatinine 1.1, LDL 73, hgb 12.7  PMH: 1. HTN 2. Hyperlipidemia 3. COPD: Prior smoker.  PFTs (4/16) with FVC 37%, FEV1 36%, ratio 97%, TLC 67%, DLCO 32% => severe obstruction and restriction.  COPD + elevated right hemidiaphragm.  4. Intermittent LBBB/IVCD 5. Hypothyroidism 6. Cardiomyopathy: Nonischemic.  Echo (6/13) with EF 45-50%, paradoxical septal motion.  LHC (6/13) with 40% D1 stenosis, 30% PLV stenosis.  Echo (12/15) with EF 10-15%, moderately dilated LV, diffuse hypokinesis, moderate MR.   Lexiscan Cardiolite (12/15) with EF 25%, diffuse hypokinesis, apical anteroseptal and apical fixed defect. LHC/RHC (1/16) with mean RA 8, PA 37/19, mean PCWP  19, CI 2.63, EF 30%, no significant CAD.  Cardiac MRI 12/20/2014: EF 24% Mod mitral regurg thought to be from dilated LV, normal RV size, no LGE.  Echo (5/16) with EF 20-25%, mildly dilated and mildly dysfunctional RV, mild MR. Medtronic CRT-D placed 6/16.  CPX (7/16) with peak VO2 16.3, VE/VCO2 slope 25, RER 1.1, low normal functional capacity limited primarily by lung disease.  - Echo (11/16) with EF 40-45%, mild MR.  - Echo (7/18): EF 40-45%, diffuse hypokinesis, grade II diastolic dysfunction.  - Echo (7/19): EF  50-55%, mild MR, PASP 37 mmHg.  - Echo (10/20): EF 50-55%, septal bounce, mildly decreased RV systolic function, PASP 26 mmHg.  7. 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  8. OSA: 04/18/2015 Sleep Study. She has not been able to tolerate CPAP. 9. ABIs (12/18): Normal  Current Outpatient Medications  Medication Sig Dispense Refill  . aspirin 81 MG tablet Take 81 mg by mouth at bedtime.     Marland Kitchen atorvastatin (LIPITOR) 10 MG tablet TAKE 1 TABLET BY MOUTH EVERYDAY AT BEDTIME 90 tablet 3  . carvedilol (COREG) 12.5 MG tablet TAKE 1 TABLET (12.5 MG TOTAL) BY MOUTH 2 (TWO) TIMES DAILY. 180 tablet 3  . furosemide (LASIX) 20 MG tablet TAKE 1 TABLET BY MOUTH TWICE A DAY 180 tablet 3  . levothyroxine (SYNTHROID, LEVOTHROID) 100 MCG tablet Take 100 mcg by mouth daily before breakfast.    . Multiple Vitamins-Minerals (MULTIVITAMIN WITH MINERALS) tablet Take 1 tablet by mouth daily.    . niacin 500 MG tablet Take 1,500 mg by mouth at bedtime.    . nitroGLYCERIN (NITROSTAT) 0.4 MG SL tablet Place 1 tablet (0.4 mg total) under the tongue every 5 (five) minutes as needed for chest pain. 25 tablet 3  . sacubitril-valsartan (ENTRESTO) 97-103 MG Take 1 tablet by mouth 2 (two) times daily. 180 tablet 3  . spironolactone (ALDACTONE) 25 MG tablet Take 1 tablet (25 mg total) by mouth daily. 90 tablet 3  . augmented betamethasone dipropionate (DIPROLENE-AF) 0.05 % cream APPLY TO AFFECTED AREA TWICE A DAY AS NEEDED (NOT TO FACE, GROIN, OR UNDERARMS)    . cholecalciferol (VITAMIN D) 400 units TABS tablet Take 400 Units by mouth daily.    Marland Kitchen HYDROcodone-acetaminophen (NORCO/VICODIN) 5-325 MG tablet Take 1-2 tablets by mouth every 6 (six) hours as needed for moderate pain. (Patient not taking: Reported on 08/08/2020) 12 tablet 0  . ibuprofen (ADVIL) 600 MG tablet Take 1 tablet (600 mg total) by mouth every 6 (six) hours as needed. (Patient not taking: Reported on 08/08/2020) 30 tablet 0  .  Phenylephrine-APAP-guaiFENesin (MUCINEX SINUS-MAX PO) Take 1 tablet by mouth daily as needed (sinuses). (Patient not taking: Reported on 08/08/2020)    . potassium chloride (KLOR-CON) 10 MEQ tablet TAKE 1 TABLET BY MOUTH EVERY DAY (Patient not taking: Reported on 08/08/2020) 90 tablet 3  . traMADol (ULTRAM) 50 MG tablet Take 50 mg by mouth every evening.     No current facility-administered medications for this encounter.    Allergies:   Prednisone   Social History:  The patient  reports that she quit smoking about 5 years ago. Her smoking use included cigarettes. She has a 70.00 pack-year smoking history. She has never used smokeless tobacco. She reports that she does not drink alcohol and does not use drugs.   Family History:  The patient's family history includes COPD in her mother; Cirrhosis in her sister; Colon cancer (age of onset: 68) in her maternal  uncle; Colon polyps in her mother; Coronary artery disease in her father; Diabetes in her sister; Heart attack in her father; Hypertension in her father.   ROS:  Please see the history of present illness.   All other systems are personally reviewed and negative.   Exam:   BP 106/68 (BP Location: Left Arm, Patient Position: Sitting, Cuff Size: Normal)   Pulse 72   Ht 5\' 2"  (1.575 m)   Wt 76.5 kg (168 lb 9.6 oz)   SpO2 97%   BMI 30.84 kg/m   General: NAD Neck: No JVD, no thyromegaly or thyroid nodule.  Lungs: Decreased right base.  CV: Nondisplaced PMI.  Heart regular S1/S2, no S3/S4, no murmur.  No peripheral edema.  No carotid bruit.  Normal pedal pulses.  Abdomen: Soft, nontender, no hepatosplenomegaly, no distention.  Skin: Intact without lesions or rashes.  Neurologic: Alert and oriented x 3.  Psych: Normal affect. Extremities: No clubbing or cyanosis.  HEENT: Normal.   Recent Labs: 08/08/2020: BUN 12; Creatinine, Ser 0.95; Potassium 3.7; Sodium 140  Personally reviewed   Wt Readings from Last 3 Encounters:  08/08/20 76.5 kg  (168 lb 9.6 oz)  09/08/19 80.5 kg (177 lb 6.4 oz)  05/21/19 80.6 kg (177 lb 9.6 oz)      ASSESSMENT AND PLAN:  1. Cardiomyopathy: Nonischemic cardiomyopathy. In the past, she had EF 45-50% with no significant CAD (2013).  LBBB-related nonischemic cardiomyopathy.  However, in the fall of 2015 she developed exertional chest pain and dyspnea.  Echo and Cardiolite showed marked worsening of her LV systolic function (EF 69-62% by echo) and Cardiolite suggested possible prior MI.  Left heart cath in 1/16 showed no significant CAD.  RHC showed mildly elevated right and left heart filling pressures and normal CI. SPEP negative and no infiltrative disease on cardiac MRI. Repeat echo in 5/16 with EF 20-25%.  CPX showed limitations are related to lung disease as well as obesity, no HF limitation. Medtronic CRT-D placement.  Followup echo in 11/16 showed improvement in EF to 40-45%.  Echo in 10/20 showed EF 50-55%.  She is not volume overloaded on exam, NYHA class I-II.    - Continue Lasix 20 mg daily.  BMET today.   - Continue Coreg 12.5 mg bid.   - Continue Entresto to 97/103 bid.   - Continue spironolactone 25 mg daily.   - She will need BMET every 3 months to follow K and creatinine given her medication regimen.    - Repeat echo at 6 month followup.   2. Elevated right hemidiaphragm: This likely contributes to dyspnea.   3. OSA:  Has not been able to tolerate CPAP.    4. COPD: Severe by PFTs. No longer smokes.  5. Hyperlipidemia: LDL ok in 8/21.  Recommended follow-up:  6 months with echo.   Signed, Loralie Champagne, MD  08/09/2020  [facility] [address] [facility] [place] [address] [phone] (office) [phone] (fax)

## 2020-08-12 DIAGNOSIS — R69 Illness, unspecified: Secondary | ICD-10-CM | POA: Diagnosis not present

## 2020-08-18 ENCOUNTER — Ambulatory Visit (INDEPENDENT_AMBULATORY_CARE_PROVIDER_SITE_OTHER): Payer: Medicare HMO

## 2020-08-18 DIAGNOSIS — I429 Cardiomyopathy, unspecified: Secondary | ICD-10-CM

## 2020-08-18 LAB — CUP PACEART REMOTE DEVICE CHECK
Battery Remaining Longevity: 33 mo
Battery Voltage: 2.95 V
Brady Statistic AP VP Percent: 17.3 %
Brady Statistic AP VS Percent: 0.3 %
Brady Statistic AS VP Percent: 81.27 %
Brady Statistic AS VS Percent: 1.13 %
Brady Statistic RA Percent Paced: 17.6 %
Brady Statistic RV Percent Paced: 97.02 %
Date Time Interrogation Session: 20211014044225
HighPow Impedance: 68 Ohm
Implantable Lead Implant Date: 20160610
Implantable Lead Implant Date: 20160610
Implantable Lead Implant Date: 20160610
Implantable Lead Location: 753858
Implantable Lead Location: 753859
Implantable Lead Location: 753860
Implantable Lead Model: 4396
Implantable Lead Model: 5076
Implantable Pulse Generator Implant Date: 20160610
Lead Channel Impedance Value: 361 Ohm
Lead Channel Impedance Value: 361 Ohm
Lead Channel Impedance Value: 418 Ohm
Lead Channel Impedance Value: 551 Ohm
Lead Channel Impedance Value: 608 Ohm
Lead Channel Impedance Value: 665 Ohm
Lead Channel Pacing Threshold Amplitude: 0.375 V
Lead Channel Pacing Threshold Amplitude: 0.75 V
Lead Channel Pacing Threshold Amplitude: 1.375 V
Lead Channel Pacing Threshold Pulse Width: 0.4 ms
Lead Channel Pacing Threshold Pulse Width: 0.4 ms
Lead Channel Pacing Threshold Pulse Width: 0.4 ms
Lead Channel Sensing Intrinsic Amplitude: 13.375 mV
Lead Channel Sensing Intrinsic Amplitude: 13.375 mV
Lead Channel Sensing Intrinsic Amplitude: 2.125 mV
Lead Channel Sensing Intrinsic Amplitude: 2.125 mV
Lead Channel Setting Pacing Amplitude: 1.5 V
Lead Channel Setting Pacing Amplitude: 2 V
Lead Channel Setting Pacing Amplitude: 2.5 V
Lead Channel Setting Pacing Pulse Width: 0.4 ms
Lead Channel Setting Pacing Pulse Width: 0.4 ms
Lead Channel Setting Sensing Sensitivity: 0.3 mV

## 2020-08-23 NOTE — Progress Notes (Signed)
Remote ICD transmission.   

## 2020-09-19 ENCOUNTER — Other Ambulatory Visit: Payer: Self-pay | Admitting: Internal Medicine

## 2020-09-19 DIAGNOSIS — Z1231 Encounter for screening mammogram for malignant neoplasm of breast: Secondary | ICD-10-CM

## 2020-10-24 ENCOUNTER — Other Ambulatory Visit: Payer: Self-pay | Admitting: Cardiology

## 2020-11-01 ENCOUNTER — Ambulatory Visit
Admission: RE | Admit: 2020-11-01 | Discharge: 2020-11-01 | Disposition: A | Discharge status: Home / Self Care | Payer: Medicare HMO | Source: Ambulatory Visit | Attending: Internal Medicine | Admitting: Internal Medicine

## 2020-11-01 ENCOUNTER — Other Ambulatory Visit: Payer: Self-pay

## 2020-11-01 DIAGNOSIS — Z1231 Encounter for screening mammogram for malignant neoplasm of breast: Secondary | ICD-10-CM | POA: Diagnosis not present

## 2020-11-02 DIAGNOSIS — H61002 Unspecified perichondritis of left external ear: Secondary | ICD-10-CM | POA: Diagnosis not present

## 2020-11-17 ENCOUNTER — Ambulatory Visit (INDEPENDENT_AMBULATORY_CARE_PROVIDER_SITE_OTHER): Payer: Medicare HMO

## 2020-11-17 DIAGNOSIS — I429 Cardiomyopathy, unspecified: Secondary | ICD-10-CM

## 2020-11-17 LAB — CUP PACEART REMOTE DEVICE CHECK
Battery Remaining Longevity: 30 mo
Battery Voltage: 2.95 V
Brady Statistic AP VP Percent: 18.71 %
Brady Statistic AP VS Percent: 0.3 %
Brady Statistic AS VP Percent: 79.74 %
Brady Statistic AS VS Percent: 1.25 %
Brady Statistic RA Percent Paced: 18.89 %
Brady Statistic RV Percent Paced: 96.74 %
Date Time Interrogation Session: 20220113044223
HighPow Impedance: 60 Ohm
Implantable Lead Implant Date: 20160610
Implantable Lead Implant Date: 20160610
Implantable Lead Implant Date: 20160610
Implantable Lead Location: 753858
Implantable Lead Location: 753859
Implantable Lead Location: 753860
Implantable Lead Model: 4396
Implantable Lead Model: 5076
Implantable Pulse Generator Implant Date: 20160610
Lead Channel Impedance Value: 361 Ohm
Lead Channel Impedance Value: 361 Ohm
Lead Channel Impedance Value: 418 Ohm
Lead Channel Impedance Value: 456 Ohm
Lead Channel Impedance Value: 513 Ohm
Lead Channel Impedance Value: 703 Ohm
Lead Channel Pacing Threshold Amplitude: 0.5 V
Lead Channel Pacing Threshold Amplitude: 0.875 V
Lead Channel Pacing Threshold Amplitude: 1.25 V
Lead Channel Pacing Threshold Pulse Width: 0.4 ms
Lead Channel Pacing Threshold Pulse Width: 0.4 ms
Lead Channel Pacing Threshold Pulse Width: 0.4 ms
Lead Channel Sensing Intrinsic Amplitude: 1.875 mV
Lead Channel Sensing Intrinsic Amplitude: 1.875 mV
Lead Channel Sensing Intrinsic Amplitude: 17.25 mV
Lead Channel Sensing Intrinsic Amplitude: 17.25 mV
Lead Channel Setting Pacing Amplitude: 1.75 V
Lead Channel Setting Pacing Amplitude: 2 V
Lead Channel Setting Pacing Amplitude: 2.25 V
Lead Channel Setting Pacing Pulse Width: 0.4 ms
Lead Channel Setting Pacing Pulse Width: 0.4 ms
Lead Channel Setting Sensing Sensitivity: 0.3 mV

## 2020-11-28 ENCOUNTER — Other Ambulatory Visit: Payer: Self-pay

## 2020-11-28 ENCOUNTER — Ambulatory Visit (HOSPITAL_COMMUNITY)
Admission: RE | Admit: 2020-11-28 | Discharge: 2020-11-28 | Disposition: A | Discharge status: Home / Self Care | Payer: Medicare HMO | Source: Ambulatory Visit | Attending: Cardiology | Admitting: Cardiology

## 2020-11-28 DIAGNOSIS — I5022 Chronic systolic (congestive) heart failure: Secondary | ICD-10-CM | POA: Diagnosis not present

## 2020-11-28 LAB — BASIC METABOLIC PANEL
Anion gap: 9 (ref 5–15)
BUN: 15 mg/dL (ref 8–23)
CO2: 26 mmol/L (ref 22–32)
Calcium: 9.6 mg/dL (ref 8.9–10.3)
Chloride: 106 mmol/L (ref 98–111)
Creatinine, Ser: 0.95 mg/dL (ref 0.44–1.00)
GFR, Estimated: >60 mL/min (ref >60)
Glucose, Bld: 101 mg/dL — ABNORMAL HIGH (ref 70–99)
Potassium: 3.6 mmol/L (ref 3.5–5.1)
Sodium: 141 mmol/L (ref 135–145)

## 2020-11-30 DIAGNOSIS — M5136 Other intervertebral disc degeneration, lumbar region: Secondary | ICD-10-CM | POA: Diagnosis not present

## 2020-11-30 DIAGNOSIS — M545 Low back pain, unspecified: Secondary | ICD-10-CM | POA: Diagnosis not present

## 2020-11-30 DIAGNOSIS — M150 Primary generalized (osteo)arthritis: Secondary | ICD-10-CM | POA: Diagnosis not present

## 2020-11-30 DIAGNOSIS — M19042 Primary osteoarthritis, left hand: Secondary | ICD-10-CM | POA: Diagnosis not present

## 2020-11-30 DIAGNOSIS — M79641 Pain in right hand: Secondary | ICD-10-CM | POA: Diagnosis not present

## 2020-11-30 DIAGNOSIS — M79642 Pain in left hand: Secondary | ICD-10-CM | POA: Diagnosis not present

## 2020-11-30 DIAGNOSIS — M549 Dorsalgia, unspecified: Secondary | ICD-10-CM | POA: Diagnosis not present

## 2020-11-30 DIAGNOSIS — M1990 Unspecified osteoarthritis, unspecified site: Secondary | ICD-10-CM | POA: Diagnosis not present

## 2020-11-30 DIAGNOSIS — M19041 Primary osteoarthritis, right hand: Secondary | ICD-10-CM | POA: Diagnosis not present

## 2020-11-30 DIAGNOSIS — I429 Cardiomyopathy, unspecified: Secondary | ICD-10-CM | POA: Diagnosis not present

## 2020-11-30 NOTE — Progress Notes (Signed)
Remote ICD transmission.   

## 2020-12-14 DIAGNOSIS — E213 Hyperparathyroidism, unspecified: Secondary | ICD-10-CM | POA: Diagnosis not present

## 2020-12-14 DIAGNOSIS — E7800 Pure hypercholesterolemia, unspecified: Secondary | ICD-10-CM | POA: Diagnosis not present

## 2020-12-21 DIAGNOSIS — I10 Essential (primary) hypertension: Secondary | ICD-10-CM | POA: Diagnosis not present

## 2020-12-21 DIAGNOSIS — E7800 Pure hypercholesterolemia, unspecified: Secondary | ICD-10-CM | POA: Diagnosis not present

## 2020-12-21 DIAGNOSIS — M1990 Unspecified osteoarthritis, unspecified site: Secondary | ICD-10-CM | POA: Diagnosis not present

## 2020-12-21 DIAGNOSIS — M5136 Other intervertebral disc degeneration, lumbar region: Secondary | ICD-10-CM | POA: Diagnosis not present

## 2020-12-21 DIAGNOSIS — I429 Cardiomyopathy, unspecified: Secondary | ICD-10-CM | POA: Diagnosis not present

## 2020-12-21 DIAGNOSIS — E039 Hypothyroidism, unspecified: Secondary | ICD-10-CM | POA: Diagnosis not present

## 2020-12-21 DIAGNOSIS — I509 Heart failure, unspecified: Secondary | ICD-10-CM | POA: Diagnosis not present

## 2021-01-11 ENCOUNTER — Other Ambulatory Visit (HOSPITAL_COMMUNITY): Payer: Self-pay | Admitting: Cardiology

## 2021-01-22 ENCOUNTER — Other Ambulatory Visit (HOSPITAL_COMMUNITY): Payer: Self-pay | Admitting: Cardiology

## 2021-02-03 ENCOUNTER — Encounter (HOSPITAL_COMMUNITY): Payer: Self-pay | Admitting: Cardiology

## 2021-02-03 ENCOUNTER — Other Ambulatory Visit: Payer: Self-pay

## 2021-02-03 ENCOUNTER — Ambulatory Visit (HOSPITAL_COMMUNITY)
Admission: RE | Admit: 2021-02-03 | Discharge: 2021-02-03 | Disposition: A | Discharge status: Home / Self Care | Payer: Medicare HMO | Source: Ambulatory Visit | Attending: Cardiology | Admitting: Cardiology

## 2021-02-03 VITALS — BP 118/70 | HR 70 | Wt 172.0 lb

## 2021-02-03 DIAGNOSIS — Z6831 Body mass index (BMI) 31.0-31.9, adult: Secondary | ICD-10-CM | POA: Insufficient documentation

## 2021-02-03 DIAGNOSIS — E669 Obesity, unspecified: Secondary | ICD-10-CM | POA: Diagnosis not present

## 2021-02-03 DIAGNOSIS — E785 Hyperlipidemia, unspecified: Secondary | ICD-10-CM | POA: Insufficient documentation

## 2021-02-03 DIAGNOSIS — G4733 Obstructive sleep apnea (adult) (pediatric): Secondary | ICD-10-CM | POA: Insufficient documentation

## 2021-02-03 DIAGNOSIS — J449 Chronic obstructive pulmonary disease, unspecified: Secondary | ICD-10-CM | POA: Insufficient documentation

## 2021-02-03 DIAGNOSIS — I447 Left bundle-branch block, unspecified: Secondary | ICD-10-CM | POA: Diagnosis not present

## 2021-02-03 DIAGNOSIS — R0602 Shortness of breath: Secondary | ICD-10-CM | POA: Insufficient documentation

## 2021-02-03 DIAGNOSIS — I428 Other cardiomyopathies: Secondary | ICD-10-CM | POA: Insufficient documentation

## 2021-02-03 DIAGNOSIS — Z8249 Family history of ischemic heart disease and other diseases of the circulatory system: Secondary | ICD-10-CM | POA: Insufficient documentation

## 2021-02-03 DIAGNOSIS — I110 Hypertensive heart disease with heart failure: Secondary | ICD-10-CM | POA: Insufficient documentation

## 2021-02-03 DIAGNOSIS — M25559 Pain in unspecified hip: Secondary | ICD-10-CM | POA: Diagnosis not present

## 2021-02-03 DIAGNOSIS — I429 Cardiomyopathy, unspecified: Secondary | ICD-10-CM | POA: Insufficient documentation

## 2021-02-03 DIAGNOSIS — Z79899 Other long term (current) drug therapy: Secondary | ICD-10-CM | POA: Diagnosis not present

## 2021-02-03 DIAGNOSIS — Z888 Allergy status to other drugs, medicaments and biological substances status: Secondary | ICD-10-CM | POA: Diagnosis not present

## 2021-02-03 DIAGNOSIS — I5022 Chronic systolic (congestive) heart failure: Secondary | ICD-10-CM | POA: Diagnosis not present

## 2021-02-03 DIAGNOSIS — Z7982 Long term (current) use of aspirin: Secondary | ICD-10-CM | POA: Diagnosis not present

## 2021-02-03 DIAGNOSIS — Z87891 Personal history of nicotine dependence: Secondary | ICD-10-CM | POA: Diagnosis not present

## 2021-02-03 DIAGNOSIS — J986 Disorders of diaphragm: Secondary | ICD-10-CM | POA: Insufficient documentation

## 2021-02-03 HISTORY — DX: Heart failure, unspecified: I50.9

## 2021-02-03 LAB — BASIC METABOLIC PANEL
Anion gap: 6 (ref 5–15)
BUN: 13 mg/dL (ref 8–23)
CO2: 28 mmol/L (ref 22–32)
Calcium: 9.7 mg/dL (ref 8.9–10.3)
Chloride: 106 mmol/L (ref 98–111)
Creatinine, Ser: 0.9 mg/dL (ref 0.44–1.00)
GFR, Estimated: >60 mL/min (ref >60)
Glucose, Bld: 96 mg/dL (ref 70–99)
Potassium: 4 mmol/L (ref 3.5–5.1)
Sodium: 140 mmol/L (ref 135–145)

## 2021-02-03 MED ORDER — FUROSEMIDE 20 MG PO TABS: 20.0000 mg | ORAL_TABLET | Freq: Every day | ORAL | Status: DC | PRN

## 2021-02-03 NOTE — Patient Instructions (Signed)
Stop take Furosemide Daily, ONLY TAKE IT AS NEEDED  Labs done today, we will call you for abnormal results  Your physician has requested that you have an echocardiogram. Echocardiography is a painless test that uses sound waves to create images of your heart. It provides your doctor with information about the size and shape of your heart and how well your heart's chambers and valves are working. This procedure takes approximately one hour. There are no restrictions for this procedure.  Please call our office in September to schedule your follow up appointment  If you have any questions or concerns before your next appointment please send [place] a message through [place] or call our office at [phone].    TO LEAVE A MESSAGE FOR THE NURSE SELECT OPTION 2, PLEASE LEAVE A MESSAGE INCLUDING: . YOUR NAME . DATE OF BIRTH . CALL BACK NUMBER . REASON FOR CALL**this is important as we prioritize the call backs  [place] AS LONG AS YOU CALL BEFORE 4:00 PM  At the [facility], you and your health needs are our priority. As part of our continuing mission to provide you with exceptional heart care, we have created designated Provider Care Teams. These Care Teams include your primary Cardiologist (physician) and Advanced Practice Providers (APPs- Physician Assistants and Nurse Practitioners) who all work together to provide you with the care you need, when you need it.   You may see any of the following providers on your designated Care Team at your next follow up: Marland Kitchen Dr Glori Bickers . Dr Loralie Champagne . Dr Vickki Muff . Darrick Grinder, NP . Lyda Jester, [place] . Audry Riles, PharmD   Please be sure to bring in all your medications bottles to every appointment.

## 2021-02-04 NOTE — Progress Notes (Signed)
Date:  02/04/2021   ID:  Verdie Shire, DOB [birth year], MRN [id number]   Provider location: [facility] Type of Visit: Established patient  PCP:  Deland Pretty, MD  Cardiologist: Dr. Aundra Dubin  Chief Complaint: Shortness of breath   History of Present Illness: Stacy Hernandez is a 65 y.o. female who has a history of HTN, intermittent LBBB, and nonischemic cardiomyopathy.  She was admitted with chest pain in 6/13.  ECG showed intermittent LBBB.  Echo showed EF 45-50%.  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    Developed CP and dyspnea. She was set up for echo and Cardiolite in 12/15.  Echo showed EF 10-15% (significant fall).  She had a Lexiscan Cardiolite with EF 25% and apical anteroseptal and apical fixed perfusion defect.  Therefore, she was sent for left and right heart catheterization in 1/16.  This showed mild elevation in LV and RV filling pressures and no significant CAD.  She was started on Lasix 20 mg daily.  Echo in 5/16 showed EF 20-25%, mildly dilated and mildly dysfunctional RV.  PFTs 4/16 showed severe obstruction and restriction.  She has seen Dr Melvyn Novas.  She is noted to have an elevated right hemidiaphragm.   In 6/16, she had Medtronic CRT-D system placed.  In 7/16, CPX showed low normal functional capacity with the main limitation being pulmonary.  Repeat echo in 11/16 showed improvement in EF to 40-45%.  Echo in 7/18 showed stable EF 40-45%.  Echo in 7/19 showed EF up to 50-55%.  Echo in 10/20 showed EF 50-55% with mildly decreased RV systolic function.   She returns today for followup of CHF.  She gets short of breath with long walks and stairs.  No orthopnea/PND.  No chest pain.  Mainly limited by hip pain.  She has a history of vertigo, lately has been getting a spinning sensation when she lies down too fast.   MDT device interrogation: >99% BiV pacing, fluid index < threshold.   ECG (personally reviewed): NSR, BiV pacing   Labs  (12/13): LDL 80, HDL 46, K 4, creatinine 0.7 Labs (6/14): K 4, creatinine 0.8, LDL 73, HDL 45 Labs (12/15): K 4.2, creatinine 0.7, HCT 43.3, LDL 60, HDL 51, BNP 577, TSH normal Labs (2/16): K 4.2, creatinine 0.7 Labs (1/16): SPEP no M spike K 4.3 Creatinine 0.74  Labs (2/16): K 4.2, creatinine 0.78 Labs (02/23/2015) : K 4.2 Creatinine 0.90, BNP 339 Labs (04/13/2015): K 4.5 Creatinine 1.01  Labs (05/17/2015): K 4.7 Creatinine 0.71  Labs (11/16): K 4.1, creatinine 0.74 Labs (6/17): K 4.2, creatinine 0.85, BNP 76 Labs (1/18): LDL 55, HDL 42 Labs (7/18): K 3.9, creatinine 0.91 Labs (1/19): K 4, creatinine 1.0, LDL 59, hgb 13.7 Labs (10/19): LDL 70 Labs (1/20): K 4.2, creatinine 0.96 Labs (7/20): K 4, creatinine 1.1, LDL 76, hgb 13.2 Labs (8/21): K 5.5, creatinine 1.1, LDL 73, hgb 12.7 Labs (2/22): LDL 73, K 4.4, creatinine 0.9  PMH: 1. HTN 2. Hyperlipidemia 3. COPD: Prior smoker.  PFTs (4/16) with FVC 37%, FEV1 36%, ratio 97%, TLC 67%, DLCO 32% => severe obstruction and restriction.  COPD + elevated right hemidiaphragm.  4. Intermittent LBBB/IVCD 5. Hypothyroidism 6. Cardiomyopathy: Nonischemic.  Echo (6/13) with EF 45-50%, paradoxical septal motion.  LHC (6/13) with 40% D1 stenosis, 30% PLV stenosis.  Echo (12/15) with EF 10-15%, moderately dilated LV, diffuse hypokinesis, moderate MR.   Lexiscan Cardiolite (12/15) with EF 25%, diffuse hypokinesis, apical anteroseptal  and apical fixed defect. LHC/RHC (1/16) with mean RA 8, PA 37/19, mean PCWP 19, CI 2.63, EF 30%, no significant CAD.  Cardiac MRI 12/20/2014: EF 24% Mod mitral regurg thought to be from dilated LV, normal RV size, no LGE.  Echo (5/16) with EF 20-25%, mildly dilated and mildly dysfunctional RV, mild MR. Medtronic CRT-D placed 6/16.  CPX (7/16) with peak VO2 16.3, VE/VCO2 slope 25, RER 1.1, low normal functional capacity limited primarily by lung disease.  - Echo (11/16) with EF 40-45%, mild MR.  - Echo (7/18): EF 40-45%, diffuse  hypokinesis, grade II diastolic dysfunction.  - Echo (7/19): EF 50-55%, mild MR, PASP 37 mmHg.  - Echo (10/20): EF 50-55%, septal bounce, mildly decreased RV systolic function, PASP 26 mmHg.  7. 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  8. OSA: 04/18/2015 Sleep Study. She has not been able to tolerate CPAP. 9. ABIs (12/18): Normal  Current Outpatient Medications  Medication Sig Dispense Refill  . aspirin 81 MG tablet Take 81 mg by mouth at bedtime.     Marland Kitchen atorvastatin (LIPITOR) 10 MG tablet TAKE 1 TABLET BY MOUTH EVERYDAY AT BEDTIME 90 tablet 3  . carvedilol (COREG) 12.5 MG tablet TAKE 1 TABLET (12.5 MG TOTAL) BY MOUTH 2 (TWO) TIMES DAILY. 180 tablet 3  . cholecalciferol (VITAMIN D) 400 units TABS tablet Take 400 Units by mouth daily.    Marland Kitchen ENTRESTO 97-103 MG TAKE 1 TABLET BY MOUTH 2 (TWO) TIMES DAILY. 180 tablet 3  . levothyroxine (SYNTHROID, LEVOTHROID) 100 MCG tablet Take 100 mcg by mouth daily before breakfast.    . Multiple Vitamins-Minerals (MULTIVITAMIN WITH MINERALS) tablet Take 1 tablet by mouth daily.    . niacin 500 MG tablet Take 1,500 mg by mouth at bedtime.    . nitroGLYCERIN (NITROSTAT) 0.4 MG SL tablet Place 1 tablet (0.4 mg total) under the tongue every 5 (five) minutes as needed for chest pain. 25 tablet 3  . Phenylephrine-APAP-guaiFENesin (MUCINEX SINUS-MAX PO) Take 1 tablet by mouth daily as needed (sinuses).    Marland Kitchen spironolactone (ALDACTONE) 25 MG tablet Take 1 tablet (25 mg total) by mouth daily. 90 tablet 3  . traMADol (ULTRAM) 50 MG tablet Take 50 mg by mouth every evening.    . furosemide (LASIX) 20 MG tablet Take 1 tablet (20 mg total) by mouth daily as needed. 30 tablet    No current facility-administered medications for this encounter.    Allergies:   Prednisone   Social History:  The patient  reports that she quit smoking about 6 years ago. Her smoking use included cigarettes. She has a 70.00 pack-year smoking history. She has never used  smokeless tobacco. She reports that she does not drink alcohol and does not use drugs.   Family History:  The patient's family history includes COPD in her mother; Cirrhosis in her sister; Colon cancer (age of onset: 73) in her maternal uncle; Colon polyps in her mother; Coronary artery disease in her father; Diabetes in her sister; Heart attack in her father; Hypertension in her father.   ROS:  Please see the history of present illness.   All other systems are personally reviewed and negative.   Exam:   BP 118/70   Pulse 70   Wt 78 kg (172 lb)   SpO2 97%   BMI 31.46 kg/m   General: NAD Neck: No JVD, no thyromegaly or thyroid nodule.  Lungs: Decreased BS at bases. CV: Nondisplaced PMI.  Heart regular S1/S2, no S3/S4,  no murmur.  No peripheral edema.  No carotid bruit.  Normal pedal pulses.  Abdomen: Soft, nontender, no hepatosplenomegaly, no distention.  Skin: Intact without lesions or rashes.  Neurologic: Alert and oriented x 3.  Psych: Normal affect. Extremities: No clubbing or cyanosis.  HEENT: Normal.  Recent Labs: 02/03/2021: BUN 13; Creatinine, Ser 0.90; Potassium 4.0; Sodium 140  Personally reviewed   Wt Readings from Last 3 Encounters:  02/03/21 78 kg (172 lb)  08/08/20 76.5 kg (168 lb 9.6 oz)  09/08/19 80.5 kg (177 lb 6.4 oz)      ASSESSMENT AND PLAN:  1. Cardiomyopathy: Nonischemic cardiomyopathy. In the past, she had EF 45-50% with no significant CAD (2013).  LBBB-related nonischemic cardiomyopathy.  However, in the fall of 2015 she developed exertional chest pain and dyspnea.  Echo and Cardiolite showed marked worsening of her LV systolic function (EF 21-11% by echo) and Cardiolite suggested possible prior MI.  Left heart cath in 1/16 showed no significant CAD.  RHC showed mildly elevated right and left heart filling pressures and normal CI. SPEP negative and no infiltrative disease on cardiac MRI. Repeat echo in 5/16 with EF 20-25%.  CPX showed limitations are related  to lung disease as well as obesity, no HF limitation. Medtronic CRT-D placement.  Followup echo in 11/16 showed improvement in EF to 40-45%.  Echo in 10/20 showed EF 50-55%.  She is not volume overloaded on exam or by Optivol, NYHA class II symptoms.  I suspect residual dyspnea is due to COPD.  - I think she can stop Lasix.   - Continue Coreg 12.5 mg bid.   - Continue Entresto to 97/103 bid.   - Continue spironolactone 25 mg daily.   - She will need BMET every 3 months to follow K and creatinine given her medication regimen.  Will get BMET today.  - Arrange for echo.   2. Elevated right hemidiaphragm: This likely contributes to dyspnea.   3. OSA:  Has not been able to tolerate CPAP.    4. COPD: Severe by PFTs. No longer smokes.  5. Hyperlipidemia: LDL ok in 2/22.  Recommended follow-up:  6 months   Signed, Loralie Champagne, MD  02/04/2021  [place] [address] [facility] and [place] [address] [phone] (office) [phone] (fax)

## 2021-02-16 ENCOUNTER — Ambulatory Visit (INDEPENDENT_AMBULATORY_CARE_PROVIDER_SITE_OTHER): Payer: Medicare HMO

## 2021-02-16 DIAGNOSIS — I428 Other cardiomyopathies: Secondary | ICD-10-CM | POA: Diagnosis not present

## 2021-02-16 DIAGNOSIS — I5022 Chronic systolic (congestive) heart failure: Secondary | ICD-10-CM

## 2021-02-16 LAB — CUP PACEART REMOTE DEVICE CHECK
Battery Remaining Longevity: 29 mo
Battery Voltage: 2.95 V
Brady Statistic AP VP Percent: 15.85 %
Brady Statistic AP VS Percent: 0.25 %
Brady Statistic AS VP Percent: 82.75 %
Brady Statistic AS VS Percent: 1.15 %
Brady Statistic RA Percent Paced: 16.09 %
Brady Statistic RV Percent Paced: 97.25 %
Date Time Interrogation Session: 20220414022824
HighPow Impedance: 66 Ohm
Implantable Lead Implant Date: 20160610
Implantable Lead Implant Date: 20160610
Implantable Lead Implant Date: 20160610
Implantable Lead Location: 753858
Implantable Lead Location: 753859
Implantable Lead Location: 753860
Implantable Lead Model: 4396
Implantable Lead Model: 5076
Implantable Pulse Generator Implant Date: 20160610
Lead Channel Impedance Value: 361 Ohm
Lead Channel Impedance Value: 418 Ohm
Lead Channel Impedance Value: 475 Ohm
Lead Channel Impedance Value: 532 Ohm
Lead Channel Impedance Value: 589 Ohm
Lead Channel Impedance Value: 760 Ohm
Lead Channel Pacing Threshold Amplitude: 0.375 V
Lead Channel Pacing Threshold Amplitude: 0.875 V
Lead Channel Pacing Threshold Amplitude: 0.875 V
Lead Channel Pacing Threshold Pulse Width: 0.4 ms
Lead Channel Pacing Threshold Pulse Width: 0.4 ms
Lead Channel Pacing Threshold Pulse Width: 0.4 ms
Lead Channel Sensing Intrinsic Amplitude: 15.125 mV
Lead Channel Sensing Intrinsic Amplitude: 15.125 mV
Lead Channel Sensing Intrinsic Amplitude: 2 mV
Lead Channel Sensing Intrinsic Amplitude: 2 mV
Lead Channel Setting Pacing Amplitude: 1.75 V
Lead Channel Setting Pacing Amplitude: 2 V
Lead Channel Setting Pacing Amplitude: 2 V
Lead Channel Setting Pacing Pulse Width: 0.4 ms
Lead Channel Setting Pacing Pulse Width: 0.4 ms
Lead Channel Setting Sensing Sensitivity: 0.3 mV

## 2021-02-23 ENCOUNTER — Other Ambulatory Visit (HOSPITAL_COMMUNITY): Payer: Self-pay | Admitting: Cardiology

## 2021-03-03 NOTE — Progress Notes (Signed)
Remote ICD transmission.   

## 2021-03-06 ENCOUNTER — Ambulatory Visit (HOSPITAL_COMMUNITY): Payer: Medicare HMO

## 2021-04-07 ENCOUNTER — Ambulatory Visit (HOSPITAL_COMMUNITY)
Admission: RE | Admit: 2021-04-07 | Discharge: 2021-04-07 | Disposition: A | Discharge status: Home / Self Care | Payer: Medicare HMO | Source: Ambulatory Visit | Attending: Cardiology | Admitting: Cardiology

## 2021-04-07 ENCOUNTER — Other Ambulatory Visit: Payer: Self-pay

## 2021-04-07 DIAGNOSIS — J449 Chronic obstructive pulmonary disease, unspecified: Secondary | ICD-10-CM | POA: Diagnosis not present

## 2021-04-07 DIAGNOSIS — I2510 Atherosclerotic heart disease of native coronary artery without angina pectoris: Secondary | ICD-10-CM | POA: Insufficient documentation

## 2021-04-07 DIAGNOSIS — Z87891 Personal history of nicotine dependence: Secondary | ICD-10-CM | POA: Insufficient documentation

## 2021-04-07 DIAGNOSIS — G4730 Sleep apnea, unspecified: Secondary | ICD-10-CM | POA: Diagnosis not present

## 2021-04-07 DIAGNOSIS — I428 Other cardiomyopathies: Secondary | ICD-10-CM | POA: Insufficient documentation

## 2021-04-07 DIAGNOSIS — I082 Rheumatic disorders of both aortic and tricuspid valves: Secondary | ICD-10-CM | POA: Diagnosis not present

## 2021-04-07 DIAGNOSIS — I5022 Chronic systolic (congestive) heart failure: Secondary | ICD-10-CM | POA: Diagnosis not present

## 2021-04-07 LAB — ECHOCARDIOGRAM COMPLETE
Area-P 1/2: 3.03 cm2
S' Lateral: 4.1 cm

## 2021-04-07 NOTE — Progress Notes (Signed)
  Echocardiogram 2D Echocardiogram with 3D and strain has been performed.  Stacy Hernandez M 04/07/2021, 8:36 AM

## 2021-04-13 DIAGNOSIS — H6122 Impacted cerumen, left ear: Secondary | ICD-10-CM | POA: Diagnosis not present

## 2021-04-13 DIAGNOSIS — R42 Dizziness and giddiness: Secondary | ICD-10-CM | POA: Diagnosis not present

## 2021-05-05 DIAGNOSIS — R42 Dizziness and giddiness: Secondary | ICD-10-CM | POA: Diagnosis not present

## 2021-05-11 ENCOUNTER — Other Ambulatory Visit (HOSPITAL_COMMUNITY): Payer: Self-pay | Admitting: Unknown Physician Specialty

## 2021-05-11 MED ORDER — ATORVASTATIN CALCIUM 10 MG PO TABS: ORAL_TABLET | ORAL | 3 refills | Status: DC

## 2021-05-18 ENCOUNTER — Ambulatory Visit (INDEPENDENT_AMBULATORY_CARE_PROVIDER_SITE_OTHER): Payer: Medicare HMO

## 2021-05-18 DIAGNOSIS — I5022 Chronic systolic (congestive) heart failure: Secondary | ICD-10-CM | POA: Diagnosis not present

## 2021-05-18 LAB — CUP PACEART REMOTE DEVICE CHECK
Battery Remaining Longevity: 26 mo
Battery Voltage: 2.94 V
Brady Statistic AP VP Percent: 15.62 %
Brady Statistic AP VS Percent: 0.27 %
Brady Statistic AS VP Percent: 82.94 %
Brady Statistic AS VS Percent: 1.17 %
Brady Statistic RA Percent Paced: 15.85 %
Brady Statistic RV Percent Paced: 95.91 %
Date Time Interrogation Session: 20220714044223
HighPow Impedance: 68 Ohm
Implantable Lead Implant Date: 20160610
Implantable Lead Implant Date: 20160610
Implantable Lead Implant Date: 20160610
Implantable Lead Location: 753858
Implantable Lead Location: 753859
Implantable Lead Location: 753860
Implantable Lead Model: 4396
Implantable Lead Model: 5076
Implantable Pulse Generator Implant Date: 20160610
Lead Channel Impedance Value: 361 Ohm
Lead Channel Impedance Value: 361 Ohm
Lead Channel Impedance Value: 456 Ohm
Lead Channel Impedance Value: 532 Ohm
Lead Channel Impedance Value: 589 Ohm
Lead Channel Impedance Value: 703 Ohm
Lead Channel Pacing Threshold Amplitude: 0.375 V
Lead Channel Pacing Threshold Amplitude: 0.875 V
Lead Channel Pacing Threshold Amplitude: 1.125 V
Lead Channel Pacing Threshold Pulse Width: 0.4 ms
Lead Channel Pacing Threshold Pulse Width: 0.4 ms
Lead Channel Pacing Threshold Pulse Width: 0.4 ms
Lead Channel Sensing Intrinsic Amplitude: 1.75 mV
Lead Channel Sensing Intrinsic Amplitude: 1.75 mV
Lead Channel Sensing Intrinsic Amplitude: 13.875 mV
Lead Channel Sensing Intrinsic Amplitude: 13.875 mV
Lead Channel Setting Pacing Amplitude: 1.75 V
Lead Channel Setting Pacing Amplitude: 2 V
Lead Channel Setting Pacing Amplitude: 2.25 V
Lead Channel Setting Pacing Pulse Width: 0.4 ms
Lead Channel Setting Pacing Pulse Width: 0.4 ms
Lead Channel Setting Sensing Sensitivity: 0.3 mV

## 2021-06-01 DIAGNOSIS — R42 Dizziness and giddiness: Secondary | ICD-10-CM | POA: Diagnosis not present

## 2021-06-07 ENCOUNTER — Other Ambulatory Visit: Payer: Self-pay | Admitting: Cardiology

## 2021-06-10 NOTE — Progress Notes (Signed)
Remote ICD transmission.   

## 2021-06-20 DIAGNOSIS — E7801 Familial hypercholesterolemia: Secondary | ICD-10-CM | POA: Diagnosis not present

## 2021-06-20 DIAGNOSIS — E875 Hyperkalemia: Secondary | ICD-10-CM | POA: Diagnosis not present

## 2021-06-20 DIAGNOSIS — Z20822 Contact with and (suspected) exposure to covid-19: Secondary | ICD-10-CM | POA: Diagnosis not present

## 2021-06-20 DIAGNOSIS — Z0000 Encounter for general adult medical examination without abnormal findings: Secondary | ICD-10-CM | POA: Diagnosis not present

## 2021-06-20 DIAGNOSIS — E039 Hypothyroidism, unspecified: Secondary | ICD-10-CM | POA: Diagnosis not present

## 2021-06-20 DIAGNOSIS — E559 Vitamin D deficiency, unspecified: Secondary | ICD-10-CM | POA: Diagnosis not present

## 2021-07-07 ENCOUNTER — Other Ambulatory Visit (HOSPITAL_COMMUNITY): Payer: Self-pay | Admitting: Cardiology

## 2021-07-31 DIAGNOSIS — E039 Hypothyroidism, unspecified: Secondary | ICD-10-CM | POA: Diagnosis not present

## 2021-07-31 DIAGNOSIS — E213 Hyperparathyroidism, unspecified: Secondary | ICD-10-CM | POA: Diagnosis not present

## 2021-07-31 DIAGNOSIS — I10 Essential (primary) hypertension: Secondary | ICD-10-CM | POA: Diagnosis not present

## 2021-07-31 DIAGNOSIS — E890 Postprocedural hypothyroidism: Secondary | ICD-10-CM | POA: Diagnosis not present

## 2021-07-31 DIAGNOSIS — M859 Disorder of bone density and structure, unspecified: Secondary | ICD-10-CM | POA: Diagnosis not present

## 2021-07-31 DIAGNOSIS — E7800 Pure hypercholesterolemia, unspecified: Secondary | ICD-10-CM | POA: Diagnosis not present

## 2021-07-31 DIAGNOSIS — E559 Vitamin D deficiency, unspecified: Secondary | ICD-10-CM | POA: Diagnosis not present

## 2021-07-31 DIAGNOSIS — Z23 Encounter for immunization: Secondary | ICD-10-CM | POA: Diagnosis not present

## 2021-08-01 ENCOUNTER — Other Ambulatory Visit: Payer: Self-pay | Admitting: Endocrinology

## 2021-08-01 DIAGNOSIS — M859 Disorder of bone density and structure, unspecified: Secondary | ICD-10-CM

## 2021-08-04 DIAGNOSIS — I509 Heart failure, unspecified: Secondary | ICD-10-CM | POA: Diagnosis not present

## 2021-08-04 DIAGNOSIS — E890 Postprocedural hypothyroidism: Secondary | ICD-10-CM | POA: Diagnosis not present

## 2021-08-04 DIAGNOSIS — I10 Essential (primary) hypertension: Secondary | ICD-10-CM | POA: Diagnosis not present

## 2021-08-04 DIAGNOSIS — E039 Hypothyroidism, unspecified: Secondary | ICD-10-CM | POA: Diagnosis not present

## 2021-08-04 DIAGNOSIS — I429 Cardiomyopathy, unspecified: Secondary | ICD-10-CM | POA: Diagnosis not present

## 2021-08-04 DIAGNOSIS — Z95810 Presence of automatic (implantable) cardiac defibrillator: Secondary | ICD-10-CM | POA: Diagnosis not present

## 2021-08-04 DIAGNOSIS — E559 Vitamin D deficiency, unspecified: Secondary | ICD-10-CM | POA: Diagnosis not present

## 2021-08-04 DIAGNOSIS — E7800 Pure hypercholesterolemia, unspecified: Secondary | ICD-10-CM | POA: Diagnosis not present

## 2021-08-04 DIAGNOSIS — Z0000 Encounter for general adult medical examination without abnormal findings: Secondary | ICD-10-CM | POA: Diagnosis not present

## 2021-08-17 ENCOUNTER — Ambulatory Visit (INDEPENDENT_AMBULATORY_CARE_PROVIDER_SITE_OTHER): Payer: Medicare HMO

## 2021-08-17 DIAGNOSIS — I428 Other cardiomyopathies: Secondary | ICD-10-CM

## 2021-08-17 LAB — CUP PACEART REMOTE DEVICE CHECK
Battery Remaining Longevity: 25 mo
Battery Voltage: 2.93 V
Brady Statistic AP VP Percent: 17.15 %
Brady Statistic AP VS Percent: 0.29 %
Brady Statistic AS VP Percent: 81.41 %
Brady Statistic AS VS Percent: 1.15 %
Brady Statistic RA Percent Paced: 17.39 %
Brady Statistic RV Percent Paced: 96.15 %
Date Time Interrogation Session: 20221013044223
HighPow Impedance: 61 Ohm
Implantable Lead Implant Date: 20160610
Implantable Lead Implant Date: 20160610
Implantable Lead Implant Date: 20160610
Implantable Lead Location: 753858
Implantable Lead Location: 753859
Implantable Lead Location: 753860
Implantable Lead Model: 4396
Implantable Lead Model: 5076
Implantable Pulse Generator Implant Date: 20160610
Lead Channel Impedance Value: 361 Ohm
Lead Channel Impedance Value: 361 Ohm
Lead Channel Impedance Value: 418 Ohm
Lead Channel Impedance Value: 532 Ohm
Lead Channel Impedance Value: 589 Ohm
Lead Channel Impedance Value: 703 Ohm
Lead Channel Pacing Threshold Amplitude: 0.5 V
Lead Channel Pacing Threshold Amplitude: 0.875 V
Lead Channel Pacing Threshold Amplitude: 1 V
Lead Channel Pacing Threshold Pulse Width: 0.4 ms
Lead Channel Pacing Threshold Pulse Width: 0.4 ms
Lead Channel Pacing Threshold Pulse Width: 0.4 ms
Lead Channel Sensing Intrinsic Amplitude: 1.625 mV
Lead Channel Sensing Intrinsic Amplitude: 1.625 mV
Lead Channel Sensing Intrinsic Amplitude: 15.875 mV
Lead Channel Sensing Intrinsic Amplitude: 15.875 mV
Lead Channel Setting Pacing Amplitude: 1.75 V
Lead Channel Setting Pacing Amplitude: 2 V
Lead Channel Setting Pacing Amplitude: 2 V
Lead Channel Setting Pacing Pulse Width: 0.4 ms
Lead Channel Setting Pacing Pulse Width: 0.4 ms
Lead Channel Setting Sensing Sensitivity: 0.3 mV

## 2021-08-25 NOTE — Progress Notes (Signed)
Remote ICD transmission.   

## 2021-09-07 ENCOUNTER — Ambulatory Visit (HOSPITAL_COMMUNITY)
Admission: RE | Admit: 2021-09-07 | Discharge: 2021-09-07 | Disposition: A | Discharge status: Home / Self Care | Payer: Medicare HMO | Source: Ambulatory Visit | Attending: Cardiology | Admitting: Cardiology

## 2021-09-07 ENCOUNTER — Other Ambulatory Visit: Payer: Self-pay

## 2021-09-07 ENCOUNTER — Other Ambulatory Visit (HOSPITAL_COMMUNITY): Payer: Self-pay | Admitting: Cardiology

## 2021-09-07 VITALS — BP 136/88 | HR 76 | Ht 62.0 in | Wt 190.0 lb

## 2021-09-07 DIAGNOSIS — Z8249 Family history of ischemic heart disease and other diseases of the circulatory system: Secondary | ICD-10-CM | POA: Diagnosis not present

## 2021-09-07 DIAGNOSIS — J449 Chronic obstructive pulmonary disease, unspecified: Secondary | ICD-10-CM | POA: Insufficient documentation

## 2021-09-07 DIAGNOSIS — Z95810 Presence of automatic (implantable) cardiac defibrillator: Secondary | ICD-10-CM | POA: Insufficient documentation

## 2021-09-07 DIAGNOSIS — E785 Hyperlipidemia, unspecified: Secondary | ICD-10-CM | POA: Diagnosis not present

## 2021-09-07 DIAGNOSIS — J986 Disorders of diaphragm: Secondary | ICD-10-CM | POA: Diagnosis not present

## 2021-09-07 DIAGNOSIS — Z87891 Personal history of nicotine dependence: Secondary | ICD-10-CM | POA: Diagnosis not present

## 2021-09-07 DIAGNOSIS — I2510 Atherosclerotic heart disease of native coronary artery without angina pectoris: Secondary | ICD-10-CM | POA: Insufficient documentation

## 2021-09-07 DIAGNOSIS — Z6834 Body mass index (BMI) 34.0-34.9, adult: Secondary | ICD-10-CM | POA: Diagnosis not present

## 2021-09-07 DIAGNOSIS — E669 Obesity, unspecified: Secondary | ICD-10-CM | POA: Diagnosis not present

## 2021-09-07 DIAGNOSIS — I447 Left bundle-branch block, unspecified: Secondary | ICD-10-CM | POA: Insufficient documentation

## 2021-09-07 DIAGNOSIS — Z79899 Other long term (current) drug therapy: Secondary | ICD-10-CM | POA: Insufficient documentation

## 2021-09-07 DIAGNOSIS — I5022 Chronic systolic (congestive) heart failure: Secondary | ICD-10-CM | POA: Diagnosis not present

## 2021-09-07 DIAGNOSIS — I110 Hypertensive heart disease with heart failure: Secondary | ICD-10-CM | POA: Diagnosis present

## 2021-09-07 DIAGNOSIS — I428 Other cardiomyopathies: Secondary | ICD-10-CM | POA: Insufficient documentation

## 2021-09-07 DIAGNOSIS — G4733 Obstructive sleep apnea (adult) (pediatric): Secondary | ICD-10-CM | POA: Insufficient documentation

## 2021-09-07 LAB — LIPID PANEL
Cholesterol: 143 mg/dL (ref 0–200)
HDL: 34 mg/dL — ABNORMAL LOW (ref >40)
LDL Cholesterol: 71 mg/dL (ref 0–99)
Total CHOL/HDL Ratio: 4.2 RATIO
Triglycerides: 191 mg/dL — ABNORMAL HIGH (ref <150)
VLDL: 38 mg/dL (ref 0–40)

## 2021-09-07 LAB — BASIC METABOLIC PANEL
Anion gap: 7 (ref 5–15)
BUN: 11 mg/dL (ref 8–23)
CO2: 27 mmol/L (ref 22–32)
Calcium: 9.5 mg/dL (ref 8.9–10.3)
Chloride: 107 mmol/L (ref 98–111)
Creatinine, Ser: 0.81 mg/dL (ref 0.44–1.00)
GFR, Estimated: >60 mL/min (ref >60)
Glucose, Bld: 132 mg/dL — ABNORMAL HIGH (ref 70–99)
Potassium: 3.9 mmol/L (ref 3.5–5.1)
Sodium: 141 mmol/L (ref 135–145)

## 2021-09-07 NOTE — Patient Instructions (Signed)
Good to see you today    Lab work done today  we will call you for any abnormal results    Follow up lab work in 3 months  Your physician recommends that you schedule a follow-up appointment in: 1 year.Call office in September 2023  If you have any questions or concerns before your next appointment please send [place] a message through [place] or call our office at [phone].    TO LEAVE A MESSAGE FOR THE NURSE SELECT OPTION 2, PLEASE LEAVE A MESSAGE INCLUDING: YOUR NAME DATE OF BIRTH CALL BACK NUMBER REASON FOR CALL**this is important as we prioritize the call backs  YOU WILL RECEIVE A CALL BACK THE SAME DAY AS LONG AS YOU CALL BEFORE 4:00 PM  At the [facility], you and your health needs are our priority. As part of our continuing mission to provide you with exceptional heart care, we have created designated Provider Care Teams. These Care Teams include your primary Cardiologist (physician) and Advanced Practice Providers (APPs- Physician Assistants and Nurse Practitioners) who all work together to provide you with the care you need, when you need it.   You may see any of the following providers on your designated Care Team at your next follow up: Dr Glori Bickers Dr Haynes Kerns, NP Lyda Jester, [place] [facility] [place], [place] Audry Riles, PharmD   Please be sure to bring in all your medications bottles to every appointment.

## 2021-09-07 NOTE — Progress Notes (Signed)
Medication Samples have been provided to the patient.  Drug name: Delene Loll       Strength: 49/51 mg        Qty: 2  LOT: [id number]  Exp.Date: 03/2023  Dosing instructions: Take 2 tablets Twice daily   The patient has been instructed regarding the correct time, dose, and frequency of taking this medication, including desired effects and most common side effects.   Juanita Laster Raziyah Vanvleck 12:01 PM 09/07/2021

## 2021-09-08 NOTE — Progress Notes (Signed)
Date:  09/08/2021   ID:  Stacy Hernandez, DOB May 27, 1956, MRN [id number]   Provider location: [facility] Type of Visit: Established patient  PCP:  Deland Pretty, MD  Cardiologist: Dr. Aundra Dubin   History of Present Illness: Stacy Hernandez is a 65 y.o. female who has a history of HTN, intermittent LBBB, and nonischemic cardiomyopathy.  She was admitted with chest pain in 6/13.  ECG showed intermittent LBBB.  Echo showed EF 45-50%.  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     Developed CP and dyspnea. She was set up for echo and Cardiolite in 12/15.  Echo showed EF 10-15% (significant fall).  She had a Lexiscan Cardiolite with EF 25% and apical anteroseptal and apical fixed perfusion defect.  Therefore, she was sent for left and right heart catheterization in 1/16.  This showed mild elevation in LV and RV filling pressures and no significant CAD.  She was started on Lasix 20 mg daily.  Echo in 5/16 showed EF 20-25%, mildly dilated and mildly dysfunctional RV.  PFTs 4/16 showed severe obstruction and restriction.  She has seen Dr Melvyn Novas.  She is noted to have an elevated right hemidiaphragm.    In 6/16, she had Medtronic CRT-D system placed.  In 7/16, CPX showed low normal functional capacity with the main limitation being pulmonary.  Repeat echo in 11/16 showed improvement in EF to 40-45%.  Echo in 7/18 showed stable EF 40-45%.  Echo in 7/19 showed EF up to 50-55%.  Echo in 10/20 showed EF 50-55% with mildly decreased RV systolic function.  Echo in 6/22 showed EF 56%, normal RV.    She returns today for followup of CHF.  She is doing well overall. Walks on flat ground with no dyspnea.   No orthopnea/PND.  No chest pain.  Weight is up, she has been spending a week every [place], and she has been eating out a lot.    Labs (12/13): LDL 80, HDL 46, K 4, creatinine 0.7 Labs (6/14): K 4, creatinine 0.8, LDL 73, HDL 45 Labs (12/15): K 4.2, creatinine  0.7, HCT 43.3, LDL 60, HDL 51, BNP 577, TSH normal Labs (2/16): K 4.2, creatinine 0.7 Labs (1/16): SPEP no M spike K 4.3 Creatinine 0.74  Labs (2/16): K 4.2, creatinine 0.78 Labs (02/23/2015) : K 4.2 Creatinine 0.90, BNP 339 Labs (04/13/2015): K 4.5 Creatinine 1.01  Labs (05/17/2015): K 4.7 Creatinine 0.71  Labs (11/16): K 4.1, creatinine 0.74 Labs (6/17): K 4.2, creatinine 0.85, BNP 76 Labs (1/18): LDL 55, HDL 42 Labs (7/18): K 3.9, creatinine 0.91 Labs (1/19): K 4, creatinine 1.0, LDL 59, hgb 13.7 Labs (10/19): LDL 70 Labs (1/20): K 4.2, creatinine 0.96 Labs (7/20): K 4, creatinine 1.1, LDL 76, hgb 13.2 Labs (8/21): K 5.5, creatinine 1.1, LDL 73, hgb 12.7 Labs (2/22): LDL 73, K 4.4, creatinine 0.9 Labs (4/22): K 4, creatinine 0.9   PMH: 1. HTN 2. Hyperlipidemia 3. COPD: Prior smoker.  PFTs (4/16) with FVC 37%, FEV1 36%, ratio 97%, TLC 67%, DLCO 32% => severe obstruction and restriction.  COPD + elevated right hemidiaphragm.  4. Intermittent LBBB/IVCD 5. Hypothyroidism 6. Cardiomyopathy: Nonischemic.  Echo (6/13) with EF 45-50%, paradoxical septal motion.  LHC (6/13) with 40% D1 stenosis, 30% PLV stenosis.  Echo (12/15) with EF 10-15%, moderately dilated LV, diffuse hypokinesis, moderate MR.   Lexiscan Cardiolite (12/15) with EF 25%, diffuse hypokinesis, apical anteroseptal and apical fixed defect. LHC/RHC (  1/16) with mean RA 8, PA 37/19, mean PCWP 19, CI 2.63, EF 30%, no significant CAD.  Cardiac MRI 12/20/2014: EF 24% Mod mitral regurg thought to be from dilated LV, normal RV size, no LGE.  Echo (5/16) with EF 20-25%, mildly dilated and mildly dysfunctional RV, mild MR. Medtronic CRT-D placed 6/16.  CPX (7/16) with peak VO2 16.3, VE/VCO2 slope 25, RER 1.1, low normal functional capacity limited primarily by lung disease.  - Echo (11/16) with EF 40-45%, mild MR.  - Echo (7/18): EF 40-45%, diffuse hypokinesis, grade II diastolic dysfunction.  - Echo (7/19): EF 50-55%, mild MR, PASP 37 mmHg.   - Echo (10/20): EF 50-55%, septal bounce, mildly decreased RV systolic function, PASP 26 mmHg.  - Echo (6/22): EF 56%, normal RV.  7. 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  8. OSA: 04/18/2015 Sleep Study. She has not been able to tolerate CPAP. 9. ABIs (12/18): Normal  Current Outpatient Medications  Medication Sig Dispense Refill   albuterol (VENTOLIN HFA) 108 (90 Base) MCG/ACT inhaler Inhale 2 puffs into the lungs every 4 (four) hours as needed.     aspirin 81 MG tablet Take 81 mg by mouth at bedtime.      atorvastatin (LIPITOR) 10 MG tablet TAKE 1 TABLET BY MOUTH EVERYDAY AT BEDTIME 100 tablet 3   carvedilol (COREG) 12.5 MG tablet TAKE 1 TABLET (12.5 MG TOTAL) BY MOUTH 2 (TWO) TIMES DAILY. 180 tablet 3   cholecalciferol (VITAMIN D) 400 units TABS tablet Take 400 Units by mouth daily.     ENTRESTO 97-103 MG TAKE 1 TABLET BY MOUTH 2 (TWO) TIMES DAILY. 180 tablet 3   levothyroxine (SYNTHROID, LEVOTHROID) 100 MCG tablet Take 100 mcg by mouth daily before breakfast.     Multiple Vitamins-Minerals (MULTIVITAMIN WITH MINERALS) tablet Take 1 tablet by mouth daily.     niacin 500 MG tablet Take 1,500 mg by mouth at bedtime.     nitroGLYCERIN (NITROSTAT) 0.4 MG SL tablet Place 1 tablet (0.4 mg total) under the tongue every 5 (five) minutes as needed for chest pain. 25 tablet 3   Phenylephrine-APAP-guaiFENesin (MUCINEX SINUS-MAX PO) Take 1 tablet by mouth daily as needed (sinuses).     spironolactone (ALDACTONE) 25 MG tablet TAKE 1 TABLET BY MOUTH EVERY DAY 90 tablet 3   traMADol (ULTRAM) 50 MG tablet Take 50 mg by mouth every evening.     furosemide (LASIX) 20 MG tablet Take 1 tablet (20 mg total) by mouth daily as needed. (Patient not taking: Reported on 09/07/2021) 30 tablet    No current facility-administered medications for this encounter.    Allergies:   Prednisone   Social History:  The patient  reports that she quit smoking about 6 years ago. Her smoking use  included cigarettes. She has a 70.00 pack-year smoking history. She has never used smokeless tobacco. She reports that she does not drink alcohol and does not use drugs.   Family History:  The patient's family history includes COPD in her mother; Cirrhosis in her sister; Colon cancer (age of onset: 71) in her maternal uncle; Colon polyps in her mother; Coronary artery disease in her father; Diabetes in her sister; Heart attack in her father; Hypertension in her father.   ROS:  Please see the history of present illness.   All other systems are personally reviewed and negative.   Exam:   BP 136/88   Pulse 76   Ht 5\' 2"  (1.575 m)   Wt 86.2 kg (  190 lb)   SpO2 96%   BMI 34.75 kg/m   General: NAD Neck: No JVD, no thyromegaly or thyroid nodule.  Lungs: Clear to auscultation bilaterally with normal respiratory effort. CV: Nondisplaced PMI.  Heart regular S1/S2, no S3/S4, no murmur.  No peripheral edema.  No carotid bruit.  Normal pedal pulses.  Abdomen: Soft, nontender, no hepatosplenomegaly, no distention.  Skin: Intact without lesions or rashes.  Neurologic: Alert and oriented x 3.  Psych: Normal affect. Extremities: No clubbing or cyanosis.  HEENT: Normal.   Recent Labs: 09/07/2021: BUN 11; Creatinine, Ser 0.81; Potassium 3.9; Sodium 141  Personally reviewed   Wt Readings from Last 3 Encounters:  09/07/21 86.2 kg (190 lb)  02/03/21 78 kg (172 lb)  08/08/20 76.5 kg (168 lb 9.6 oz)      ASSESSMENT AND PLAN:  1. Cardiomyopathy: Nonischemic cardiomyopathy. In the past, she had EF 45-50% with no significant CAD (2013).  LBBB-related nonischemic cardiomyopathy.  However, in the fall of 2015 she developed exertional chest pain and dyspnea.  Echo and Cardiolite showed marked worsening of her LV systolic function (EF 75-88% by echo) and Cardiolite suggested possible prior MI.  Left heart cath in 1/16 showed no significant CAD.  RHC showed mildly elevated right and left heart filling pressures  and normal CI. SPEP negative and no infiltrative disease on cardiac MRI. Repeat echo in 5/16 with EF 20-25%.  CPX showed limitations are related to lung disease as well as obesity, no HF limitation. Medtronic CRT-D placement.  Followup echo in 11/16 showed improvement in EF to 40-45%.  Echo in 10/20 showed EF 50-55%.  Echo in 6/22 with EF up to 56%.  She is not volume overloaded on exam, NYHA class II symptoms.   - Continue Coreg 12.5 mg bid.   - Continue Entresto to 97/103 bid.   - Continue spironolactone 25 mg daily.   - She will need BMET every 3 months to follow K and creatinine given her medication regimen.  Will get BMET today.  2. Elevated right hemidiaphragm: This likely contributes to dyspnea.   3. OSA:  Has not been able to tolerate CPAP.    4. COPD: Severe by PFTs. No longer smokes.  5. Hyperlipidemia: LDL ok in 2/22.  Recommended follow-up: 1 year.   Signed, Loralie Champagne, MD  09/08/2021  [facility] [address] Heart and [place] [address] [phone] (office) [phone] (fax)

## 2021-09-19 ENCOUNTER — Other Ambulatory Visit (HOSPITAL_COMMUNITY): Payer: Self-pay | Admitting: *Deleted

## 2021-09-19 MED ORDER — ENTRESTO 97-103 MG PO TABS: 1.0000 | ORAL_TABLET | Freq: Two times a day (BID) | ORAL | 3 refills | Status: DC

## 2021-09-22 ENCOUNTER — Other Ambulatory Visit: Payer: Self-pay | Admitting: Internal Medicine

## 2021-09-22 DIAGNOSIS — Z1231 Encounter for screening mammogram for malignant neoplasm of breast: Secondary | ICD-10-CM

## 2021-09-26 ENCOUNTER — Telehealth (HOSPITAL_COMMUNITY): Payer: Self-pay | Admitting: Pharmacy Technician

## 2021-09-26 NOTE — Telephone Encounter (Signed)
Advanced Heart Failure Patient Advocate Encounter  Sent in [employer] application via fax.  Will follow up.  

## 2021-10-09 NOTE — Telephone Encounter (Signed)
Advanced Heart Failure Patient Advocate Encounter   Patient was approved to receive Entresto from [employer]  Patient ID: [id number] Effective dates: 10/04/21 through 11/04/21  Called and left the patient a message. Went ahead and mentioned renewal for 2023 year.  Charlann Boxer, CPhT

## 2021-10-20 ENCOUNTER — Ambulatory Visit (INDEPENDENT_AMBULATORY_CARE_PROVIDER_SITE_OTHER): Payer: Medicare HMO

## 2021-10-20 ENCOUNTER — Other Ambulatory Visit: Payer: Self-pay

## 2021-10-20 ENCOUNTER — Ambulatory Visit
Admission: EM | Admit: 2021-10-20 | Discharge: 2021-10-20 | Disposition: A | Discharge status: Home / Self Care | Payer: Medicare HMO | Attending: Family Medicine | Admitting: Family Medicine

## 2021-10-20 ENCOUNTER — Encounter: Payer: Self-pay | Admitting: Emergency Medicine

## 2021-10-20 DIAGNOSIS — M79672 Pain in left foot: Secondary | ICD-10-CM | POA: Diagnosis not present

## 2021-10-20 DIAGNOSIS — M7989 Other specified soft tissue disorders: Secondary | ICD-10-CM | POA: Diagnosis not present

## 2021-10-20 MED ORDER — NAPROXEN 500 MG PO TABS: 500.0000 mg | ORAL_TABLET | Freq: Two times a day (BID) | ORAL | 0 refills | Status: DC | PRN

## 2021-10-20 NOTE — Discharge Instructions (Signed)
[facility] Address: [address], [place], [place] [address] Phone: [phone] 

## 2021-10-20 NOTE — ED Triage Notes (Signed)
Left foot swelling x 2 to 3 days.  No known injury.  States she fell while trying to come into urgent care and "scrapped left knee a little"

## 2021-10-24 NOTE — ED Provider Notes (Signed)
[facility]    CSN: [id number] Arrival date & time: 10/20/21  1322      History   Chief Complaint No chief complaint on file.   HPI Stacy Hernandez is a 65 y.o. female.   Presenting today with left foot swelling x 3 days without known injury. Denies discoloration, decreased ROM, numbness, tingling, radiation of pain. So far not trying anything OTC for sxs. No past hx of orthopedic issues to left foot.    Past Medical History:  Diagnosis Date   AICD (automatic cardioverter/defibrillator) present 04/15/2015   Medtronic (serial  Number [id number]) biventricular ICD   Back pain    Recent positive ANA but told she has OA   CAD (coronary artery disease)    a.  per cath in June 2013 with nonobstructive CAD;  b.  Lexiscan Myoview (12/15):  No ischemia.  Anteroseptal and apical fixed defect - 2/2 IVCD of LBBB type vs scar, EF 25%; Intermediate Risk   Cardiomyopathy ([place])    a.  EF 45-50% by echo 04/23/2012;  b.  Echo (12/15):  EF 10-15%, Gr 2 DD, ventricular septum dyssynergy, mod MR, mod LAE.   CHF (congestive heart failure) (HCC)    COPD (chronic obstructive pulmonary disease) (HCC)    Hypercalcemia    Due to HCTZ, improved with d/c of calcium supplements   Hypertension    Hypertriglyceridemia    Nephrolithiasis    OA (osteoarthritis)    Osteoporosis    Sleep apnea    Thyroid disease    Post-ablation hypothyroidism   Tobacco abuse     Patient Active Problem List   Diagnosis Date Noted   Biventricular ICD (implantable cardioverter-defibrillator) in place 05/21/2019   Obesity 06/07/2015   S/P ICD (internal cardiac defibrillator) procedure - Medtronic (serial  Number [id number]) biventricular ICD 04/15/2015   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   Dyspnea 03/11/2015   OSA (obstructive sleep apnea) 02/10/2015   Acute sinusitis 08/65/7846   Chronic systolic CHF (congestive heart failure) ([place]) 12/16/2014   Angina decubitus ([place]) 11/04/2014   Cardiomyopathy  ([place]) 10/12/2012   CAD (coronary artery disease) 05/14/2012   HTN (hypertension) 05/14/2012   HLD (hyperlipidemia) 05/14/2012   LV dysfunction 05/14/2012    Past Surgical History:  Procedure Laterality Date   BIOPSY  08/08/2018   Procedure: BIOPSY;  Surgeon: Carol Ada, MD;  Location: [facility];  Service: Endoscopy;;   BLADDER SURGERY     bladder & bowel tac with mesh   CARPAL TUNNEL RELEASE     CESAREAN SECTION     EP IMPLANTABLE DEVICE N/A 04/15/2015   Procedure: BiV ICD Insertion CRT-D;  Surgeon: Evans Lance, MD;  Medtronic (serial  Number [id number]) biventricular ICD   ESOPHAGOGASTRODUODENOSCOPY (EGD) WITH PROPOFOL N/A 08/08/2018   Procedure: ESOPHAGOGASTRODUODENOSCOPY (EGD) WITH PROPOFOL;  Surgeon: Carol Ada, MD;  Location: [facility];  Service: Endoscopy;  Laterality: N/A;   LEFT AND RIGHT HEART CATHETERIZATION WITH CORONARY ANGIOGRAM N/A 11/12/2014   Procedure: LEFT AND RIGHT HEART CATHETERIZATION WITH CORONARY ANGIOGRAM;  Surgeon: Larey Dresser, MD;  Location[facility] LAB;  Service: Cardiovascular;  Laterality: N/A;   LEFT HEART CATHETERIZATION WITH CORONARY ANGIOGRAM N/A 04/22/2012   Procedure: LEFT HEART CATHETERIZATION WITH CORONARY ANGIOGRAM;  Surgeon: Hillary Bow, MD;  Location: [facility] CATH LAB;  Service: Cardiovascular;  Laterality: N/A;   PARTIAL HYSTERECTOMY     Due to cervical dysplasia   SAVORY DILATION N/A 08/08/2018   Procedure: SAVORY DILATION;  Surgeon: Carol Ada,  MD;  Location: [facility];  Service: Endoscopy;  Laterality: N/A;   TONSILLECTOMY      OB History   No obstetric history on file.      Home Medications    Prior to Admission medications   Medication Sig Start Date End Date Taking? Authorizing Provider  naproxen (NAPROSYN) 500 MG tablet Take 1 tablet (500 mg total) by mouth 2 (two) times daily as needed. 10/20/21  Yes Volney American, PA-C  albuterol (VENTOLIN HFA) 108 (90 Base) MCG/ACT inhaler Inhale 2 puffs into the  lungs every 4 (four) hours as needed. 08/04/21   [provider]  aspirin 81 MG tablet Take 81 mg by mouth at bedtime.     [provider]  atorvastatin (LIPITOR) 10 MG tablet TAKE 1 TABLET BY MOUTH EVERYDAY AT BEDTIME 05/11/21   Larey Dresser, MD  carvedilol (COREG) 12.5 MG tablet TAKE 1 TABLET (12.5 MG TOTAL) BY MOUTH 2 (TWO) TIMES DAILY. 07/07/21   Larey Dresser, MD  cholecalciferol (VITAMIN D) 400 units TABS tablet Take 400 Units by mouth daily.    [provider]  furosemide (LASIX) 20 MG tablet Take 1 tablet (20 mg total) by mouth daily as needed. Patient not taking: Reported on 09/07/2021 02/03/21   Larey Dresser, MD  levothyroxine (SYNTHROID, LEVOTHROID) 100 MCG tablet Take 100 mcg by mouth daily before breakfast.    [provider]  Multiple Vitamins-Minerals (MULTIVITAMIN WITH MINERALS) tablet Take 1 tablet by mouth daily.    [provider]  niacin 500 MG tablet Take 1,500 mg by mouth at bedtime.    [provider]  nitroGLYCERIN (NITROSTAT) 0.4 MG SL tablet Place 1 tablet (0.4 mg total) under the tongue every 5 (five) minutes as needed for chest pain. 10/18/14   Richardson Dopp T, PA-C  Phenylephrine-APAP-guaiFENesin (MUCINEX SINUS-MAX PO) Take 1 tablet by mouth daily as needed (sinuses).    [provider]  sacubitril-valsartan (ENTRESTO) 97-103 MG Take 1 tablet by mouth 2 (two) times daily. 09/19/21   Larey Dresser, MD  spironolactone (ALDACTONE) 25 MG tablet TAKE 1 TABLET BY MOUTH EVERY DAY 02/23/21   Larey Dresser, MD  traMADol (ULTRAM) 50 MG tablet Take 50 mg by mouth every evening.    [provider]    Family History Family History  Problem Relation Age of Onset   Coronary artery disease Father        s/p CABG in his 44s   Hypertension Father    Heart attack Father    COPD Mother        smoked   Colon polyps Mother    Colon cancer Maternal Uncle 40   Diabetes Sister    Cirrhosis Sister     Stroke Neg Hx     Social History Social History   Tobacco Use   Smoking status: Former    Packs/day: 2.00    Years: 35.00    Pack years: 70.00    Types: Cigarettes    Quit date: 11/05/2014    Years since quitting: 6.9   Smokeless tobacco: Never  Substance Use Topics   Alcohol use: No    Alcohol/week: 0.0 standard drinks   Drug use: No     Allergies   Prednisone   Review of Systems Review of Systems PER HPI   Physical Exam Triage Vital Signs ED Triage Vitals  Enc Vitals Group     BP 10/20/21 1422 (!) 148/70     Pulse Rate  10/20/21 1422 81     Resp 10/20/21 1422 18     Temp 10/20/21 1422 98.1 F (36.7 C)     Temp Source 10/20/21 1422 Oral     SpO2 10/20/21 1422 93 %     Weight --      Height --      Head Circumference --      Peak Flow --      Pain Score 10/20/21 1424 10     Pain Loc --      Pain Edu? --      Excl. in [place]? --    No data found.  Updated Vital Signs BP (!) 148/70 (BP Location: Right Arm)    Pulse 81    Temp 98.1 F (36.7 C) (Oral)    Resp 18    SpO2 93%   Visual Acuity Right Eye Distance:   Left Eye Distance:   Bilateral Distance:    Right Eye Near:   Left Eye Near:    Bilateral Near:     Physical Exam Vitals and nursing note reviewed.  Constitutional:      Appearance: Normal appearance. She is not ill-appearing.  HENT:     Head: Atraumatic.  Eyes:     Extraocular Movements: Extraocular movements intact.     Conjunctiva/sclera: Conjunctivae normal.  Cardiovascular:     Rate and Rhythm: Normal rate and regular rhythm.     Heart sounds: Normal heart sounds.  Pulmonary:     Effort: Pulmonary effort is normal.     Breath sounds: Normal breath sounds.  Musculoskeletal:        General: Swelling (trace edema dorsal lateral left foot) and tenderness (lateral left foot ttp) present. No deformity or signs of injury. Normal range of motion.     Cervical back: Normal range of motion and neck supple.  Skin:    General: Skin is warm and  dry.     Findings: No bruising or erythema.  Neurological:     Mental Status: She is alert and oriented to person, place, and time.     Comments: Left foot neurovascularly intact  Psychiatric:        Mood and Affect: Mood normal.        Thought Content: Thought content normal.        Judgment: Judgment normal.     UC Treatments / Results  Labs (all labs ordered are listed, but only abnormal results are displayed) Labs Reviewed - No data to display  EKG   Radiology No results found.  Procedures Procedures (including critical care time)  Medications Ordered in UC Medications - No data to display  Initial Impression / Assessment and Plan / UC Course  I have reviewed the triage vital signs and the nursing notes.  Pertinent labs & imaging results that were available during my care of the patient were reviewed by me and considered in my medical decision making (see chart for details).     Left foot x-ray neg for acute bony abnormality, exam overall reassuring. Suspect foot strain, treat with naproxen, epsom salt soaks, RICE and [facility] follow up given if not resolving.   Final Clinical Impressions(s) / UC Diagnoses   Final diagnoses:  Left foot pain     Discharge Instructions      [facility] Address: [place], [place], [place] [address] Phone: [phone]    ED Prescriptions     Medication Sig Dispense Auth. Provider  naproxen (NAPROSYN) 500 MG tablet Take 1 tablet (500 mg total) by mouth 2 (two) times daily as needed. 30 tablet Volney American, [place]      PDMP not reviewed this encounter.   Volney American, [place] 10/24/21 2245

## 2021-11-02 ENCOUNTER — Ambulatory Visit
Admission: RE | Admit: 2021-11-02 | Discharge: 2021-11-02 | Disposition: A | Discharge status: Home / Self Care | Payer: Medicare HMO | Source: Ambulatory Visit | Attending: Internal Medicine | Admitting: Internal Medicine

## 2021-11-02 DIAGNOSIS — Z1231 Encounter for screening mammogram for malignant neoplasm of breast: Secondary | ICD-10-CM | POA: Diagnosis not present

## 2021-11-16 ENCOUNTER — Ambulatory Visit (INDEPENDENT_AMBULATORY_CARE_PROVIDER_SITE_OTHER): Payer: Medicare HMO

## 2021-11-16 DIAGNOSIS — I428 Other cardiomyopathies: Secondary | ICD-10-CM | POA: Diagnosis not present

## 2021-11-16 LAB — CUP PACEART REMOTE DEVICE CHECK
Battery Remaining Longevity: 21 mo
Battery Voltage: 2.92 V
Brady Statistic AP VP Percent: 20.29 %
Brady Statistic AP VS Percent: 0.34 %
Brady Statistic AS VP Percent: 78.26 %
Brady Statistic AS VS Percent: 1.12 %
Brady Statistic RA Percent Paced: 20.61 %
Brady Statistic RV Percent Paced: 97.25 %
Date Time Interrogation Session: 20230112002205
HighPow Impedance: 64 Ohm
Implantable Lead Implant Date: 20160610
Implantable Lead Implant Date: 20160610
Implantable Lead Implant Date: 20160610
Implantable Lead Location: 753858
Implantable Lead Location: 753859
Implantable Lead Location: 753860
Implantable Lead Model: 4396
Implantable Lead Model: 5076
Implantable Pulse Generator Implant Date: 20160610
Lead Channel Impedance Value: 361 Ohm
Lead Channel Impedance Value: 418 Ohm
Lead Channel Impedance Value: 418 Ohm
Lead Channel Impedance Value: 475 Ohm
Lead Channel Impedance Value: 532 Ohm
Lead Channel Impedance Value: 760 Ohm
Lead Channel Pacing Threshold Amplitude: 0.375 V
Lead Channel Pacing Threshold Amplitude: 0.875 V
Lead Channel Pacing Threshold Amplitude: 0.875 V
Lead Channel Pacing Threshold Pulse Width: 0.4 ms
Lead Channel Pacing Threshold Pulse Width: 0.4 ms
Lead Channel Pacing Threshold Pulse Width: 0.4 ms
Lead Channel Sensing Intrinsic Amplitude: 1.5 mV
Lead Channel Sensing Intrinsic Amplitude: 1.5 mV
Lead Channel Sensing Intrinsic Amplitude: 15.125 mV
Lead Channel Sensing Intrinsic Amplitude: 15.125 mV
Lead Channel Setting Pacing Amplitude: 1.75 V
Lead Channel Setting Pacing Amplitude: 2 V
Lead Channel Setting Pacing Amplitude: 2 V
Lead Channel Setting Pacing Pulse Width: 0.4 ms
Lead Channel Setting Pacing Pulse Width: 0.4 ms
Lead Channel Setting Sensing Sensitivity: 0.3 mV

## 2021-11-28 NOTE — Progress Notes (Signed)
Remote ICD transmission.   

## 2021-11-30 DIAGNOSIS — M1990 Unspecified osteoarthritis, unspecified site: Secondary | ICD-10-CM | POA: Diagnosis not present

## 2021-11-30 DIAGNOSIS — M5136 Other intervertebral disc degeneration, lumbar region: Secondary | ICD-10-CM | POA: Diagnosis not present

## 2021-11-30 DIAGNOSIS — M150 Primary generalized (osteo)arthritis: Secondary | ICD-10-CM | POA: Diagnosis not present

## 2021-11-30 DIAGNOSIS — I429 Cardiomyopathy, unspecified: Secondary | ICD-10-CM | POA: Diagnosis not present

## 2021-11-30 DIAGNOSIS — M549 Dorsalgia, unspecified: Secondary | ICD-10-CM | POA: Diagnosis not present

## 2021-12-11 ENCOUNTER — Other Ambulatory Visit (HOSPITAL_COMMUNITY): Payer: Self-pay | Admitting: *Deleted

## 2021-12-11 DIAGNOSIS — I5022 Chronic systolic (congestive) heart failure: Secondary | ICD-10-CM

## 2021-12-12 NOTE — Progress Notes (Signed)
Pt due for 3 month BMP per Dr Aundra Dubin, she states [facility] needs order, order in epic and also faxed to [facility] in [place] at [phone], pt aware

## 2021-12-13 ENCOUNTER — Other Ambulatory Visit: Payer: Self-pay | Admitting: Cardiology

## 2021-12-13 DIAGNOSIS — I5022 Chronic systolic (congestive) heart failure: Secondary | ICD-10-CM | POA: Diagnosis not present

## 2021-12-14 LAB — BASIC METABOLIC PANEL
BUN/Creatinine Ratio: 18 (ref 12–28)
BUN: 15 mg/dL (ref 8–27)
CO2: 23 mmol/L (ref 20–29)
Calcium: 10 mg/dL (ref 8.7–10.3)
Chloride: 102 mmol/L (ref 96–106)
Creatinine, Ser: 0.83 mg/dL (ref 0.57–1.00)
Glucose: 118 mg/dL — ABNORMAL HIGH (ref 70–99)
Potassium: 4.2 mmol/L (ref 3.5–5.2)
Sodium: 142 mmol/L (ref 134–144)
eGFR: 78 mL/min/{1.73_m2} (ref >59)

## 2021-12-14 LAB — SPECIMEN STATUS REPORT

## 2021-12-25 ENCOUNTER — Other Ambulatory Visit (HOSPITAL_COMMUNITY): Payer: Self-pay | Admitting: Cardiology

## 2022-01-04 ENCOUNTER — Other Ambulatory Visit: Payer: Self-pay | Admitting: Cardiology

## 2022-01-16 ENCOUNTER — Ambulatory Visit
Admission: RE | Admit: 2022-01-16 | Discharge: 2022-01-16 | Disposition: A | Discharge status: Home / Self Care | Payer: Medicare HMO | Source: Ambulatory Visit | Attending: Endocrinology | Admitting: Endocrinology

## 2022-01-16 DIAGNOSIS — M8589 Other specified disorders of bone density and structure, multiple sites: Secondary | ICD-10-CM | POA: Diagnosis not present

## 2022-01-16 DIAGNOSIS — Z780 Asymptomatic menopausal state: Secondary | ICD-10-CM | POA: Diagnosis not present

## 2022-01-16 DIAGNOSIS — M859 Disorder of bone density and structure, unspecified: Secondary | ICD-10-CM

## 2022-02-01 DIAGNOSIS — E7801 Familial hypercholesterolemia: Secondary | ICD-10-CM | POA: Diagnosis not present

## 2022-02-07 DIAGNOSIS — H5200 Hypermetropia, unspecified eye: Secondary | ICD-10-CM | POA: Diagnosis not present

## 2022-02-08 DIAGNOSIS — E7800 Pure hypercholesterolemia, unspecified: Secondary | ICD-10-CM | POA: Diagnosis not present

## 2022-02-08 DIAGNOSIS — Z95810 Presence of automatic (implantable) cardiac defibrillator: Secondary | ICD-10-CM | POA: Diagnosis not present

## 2022-02-08 DIAGNOSIS — M1990 Unspecified osteoarthritis, unspecified site: Secondary | ICD-10-CM | POA: Diagnosis not present

## 2022-02-08 DIAGNOSIS — M79671 Pain in right foot: Secondary | ICD-10-CM | POA: Diagnosis not present

## 2022-02-08 DIAGNOSIS — I509 Heart failure, unspecified: Secondary | ICD-10-CM | POA: Diagnosis not present

## 2022-02-08 DIAGNOSIS — I10 Essential (primary) hypertension: Secondary | ICD-10-CM | POA: Diagnosis not present

## 2022-02-08 DIAGNOSIS — E890 Postprocedural hypothyroidism: Secondary | ICD-10-CM | POA: Diagnosis not present

## 2022-02-08 DIAGNOSIS — M5136 Other intervertebral disc degeneration, lumbar region: Secondary | ICD-10-CM | POA: Diagnosis not present

## 2022-02-15 ENCOUNTER — Ambulatory Visit (INDEPENDENT_AMBULATORY_CARE_PROVIDER_SITE_OTHER): Payer: Medicare HMO

## 2022-02-15 DIAGNOSIS — I428 Other cardiomyopathies: Secondary | ICD-10-CM | POA: Diagnosis not present

## 2022-02-15 LAB — CUP PACEART REMOTE DEVICE CHECK
Battery Remaining Longevity: 22 mo
Battery Voltage: 2.91 V
Brady Statistic AP VP Percent: 11.58 %
Brady Statistic AP VS Percent: 0.19 %
Brady Statistic AS VP Percent: 87 %
Brady Statistic AS VS Percent: 1.23 %
Brady Statistic RA Percent Paced: 11.76 %
Brady Statistic RV Percent Paced: 97.6 %
Date Time Interrogation Session: 20230413061609
HighPow Impedance: 67 Ohm
Implantable Lead Implant Date: 20160610
Implantable Lead Implant Date: 20160610
Implantable Lead Implant Date: 20160610
Implantable Lead Location: 753858
Implantable Lead Location: 753859
Implantable Lead Location: 753860
Implantable Lead Model: 4396
Implantable Lead Model: 5076
Implantable Pulse Generator Implant Date: 20160610
Lead Channel Impedance Value: 399 Ohm
Lead Channel Impedance Value: 399 Ohm
Lead Channel Impedance Value: 456 Ohm
Lead Channel Impedance Value: 513 Ohm
Lead Channel Impedance Value: 589 Ohm
Lead Channel Impedance Value: 722 Ohm
Lead Channel Pacing Threshold Amplitude: 0.375 V
Lead Channel Pacing Threshold Amplitude: 0.875 V
Lead Channel Pacing Threshold Amplitude: 1.125 V
Lead Channel Pacing Threshold Pulse Width: 0.4 ms
Lead Channel Pacing Threshold Pulse Width: 0.4 ms
Lead Channel Pacing Threshold Pulse Width: 0.4 ms
Lead Channel Sensing Intrinsic Amplitude: 1.875 mV
Lead Channel Sensing Intrinsic Amplitude: 1.875 mV
Lead Channel Sensing Intrinsic Amplitude: 15.875 mV
Lead Channel Sensing Intrinsic Amplitude: 15.875 mV
Lead Channel Setting Pacing Amplitude: 1.75 V
Lead Channel Setting Pacing Amplitude: 2 V
Lead Channel Setting Pacing Amplitude: 2.25 V
Lead Channel Setting Pacing Pulse Width: 0.4 ms
Lead Channel Setting Pacing Pulse Width: 0.4 ms
Lead Channel Setting Sensing Sensitivity: 0.3 mV

## 2022-02-16 ENCOUNTER — Other Ambulatory Visit (HOSPITAL_COMMUNITY): Payer: Self-pay | Admitting: Cardiology

## 2022-02-19 DIAGNOSIS — M7741 Metatarsalgia, right foot: Secondary | ICD-10-CM | POA: Diagnosis not present

## 2022-02-19 DIAGNOSIS — M2142 Flat foot [pes planus] (acquired), left foot: Secondary | ICD-10-CM | POA: Diagnosis not present

## 2022-02-19 DIAGNOSIS — M2141 Flat foot [pes planus] (acquired), right foot: Secondary | ICD-10-CM | POA: Diagnosis not present

## 2022-02-19 DIAGNOSIS — I739 Peripheral vascular disease, unspecified: Secondary | ICD-10-CM | POA: Diagnosis not present

## 2022-02-19 DIAGNOSIS — M792 Neuralgia and neuritis, unspecified: Secondary | ICD-10-CM | POA: Diagnosis not present

## 2022-02-19 DIAGNOSIS — G629 Polyneuropathy, unspecified: Secondary | ICD-10-CM | POA: Diagnosis not present

## 2022-02-19 DIAGNOSIS — M7742 Metatarsalgia, left foot: Secondary | ICD-10-CM | POA: Diagnosis not present

## 2022-03-05 NOTE — Progress Notes (Signed)
Remote ICD transmission.   

## 2022-03-21 DIAGNOSIS — M2142 Flat foot [pes planus] (acquired), left foot: Secondary | ICD-10-CM | POA: Diagnosis not present

## 2022-03-21 DIAGNOSIS — M792 Neuralgia and neuritis, unspecified: Secondary | ICD-10-CM | POA: Diagnosis not present

## 2022-03-21 DIAGNOSIS — I739 Peripheral vascular disease, unspecified: Secondary | ICD-10-CM | POA: Diagnosis not present

## 2022-03-21 DIAGNOSIS — M2141 Flat foot [pes planus] (acquired), right foot: Secondary | ICD-10-CM | POA: Diagnosis not present

## 2022-03-21 DIAGNOSIS — G629 Polyneuropathy, unspecified: Secondary | ICD-10-CM | POA: Diagnosis not present

## 2022-03-21 DIAGNOSIS — M7742 Metatarsalgia, left foot: Secondary | ICD-10-CM | POA: Diagnosis not present

## 2022-03-21 DIAGNOSIS — M7741 Metatarsalgia, right foot: Secondary | ICD-10-CM | POA: Diagnosis not present

## 2022-04-05 DIAGNOSIS — Z0100 Encounter for examination of eyes and vision without abnormal findings: Secondary | ICD-10-CM | POA: Diagnosis not present

## 2022-04-18 ENCOUNTER — Other Ambulatory Visit: Payer: Self-pay | Admitting: Family Medicine

## 2022-04-18 ENCOUNTER — Other Ambulatory Visit (HOSPITAL_COMMUNITY): Payer: Self-pay | Admitting: Cardiology

## 2022-04-18 NOTE — Telephone Encounter (Signed)
Provider not at this practice  Requested Prescriptions  Refused Prescriptions Disp Refills  . naproxen (NAPROSYN) 500 MG tablet [Pharmacy Med Name: NAPROXEN 500 MG TABLET] 30 tablet 0    Sig: TAKE 1 TABLET BY MOUTH TWICE A DAY AS NEEDED     There is no refill protocol information for this order

## 2022-05-17 ENCOUNTER — Ambulatory Visit (INDEPENDENT_AMBULATORY_CARE_PROVIDER_SITE_OTHER): Payer: Medicare HMO

## 2022-05-17 DIAGNOSIS — I428 Other cardiomyopathies: Secondary | ICD-10-CM

## 2022-05-17 LAB — CUP PACEART REMOTE DEVICE CHECK
Battery Remaining Longevity: 18 mo
Battery Voltage: 2.9 V
Brady Statistic AP VP Percent: 10.97 %
Brady Statistic AP VS Percent: 0.19 %
Brady Statistic AS VP Percent: 87.62 %
Brady Statistic AS VS Percent: 1.21 %
Brady Statistic RA Percent Paced: 11.15 %
Brady Statistic RV Percent Paced: 96.09 %
Date Time Interrogation Session: 20230713022604
HighPow Impedance: 74 Ohm
Implantable Lead Implant Date: 20160610
Implantable Lead Implant Date: 20160610
Implantable Lead Implant Date: 20160610
Implantable Lead Location: 753858
Implantable Lead Location: 753859
Implantable Lead Location: 753860
Implantable Lead Model: 4396
Implantable Lead Model: 5076
Implantable Pulse Generator Implant Date: 20160610
Lead Channel Impedance Value: 399 Ohm
Lead Channel Impedance Value: 399 Ohm
Lead Channel Impedance Value: 418 Ohm
Lead Channel Impedance Value: 513 Ohm
Lead Channel Impedance Value: 589 Ohm
Lead Channel Impedance Value: 703 Ohm
Lead Channel Pacing Threshold Amplitude: 0.375 V
Lead Channel Pacing Threshold Amplitude: 1 V
Lead Channel Pacing Threshold Amplitude: 1.25 V
Lead Channel Pacing Threshold Pulse Width: 0.4 ms
Lead Channel Pacing Threshold Pulse Width: 0.4 ms
Lead Channel Pacing Threshold Pulse Width: 0.4 ms
Lead Channel Sensing Intrinsic Amplitude: 1.875 mV
Lead Channel Sensing Intrinsic Amplitude: 1.875 mV
Lead Channel Sensing Intrinsic Amplitude: 14.625 mV
Lead Channel Sensing Intrinsic Amplitude: 14.625 mV
Lead Channel Setting Pacing Amplitude: 2 V
Lead Channel Setting Pacing Amplitude: 2 V
Lead Channel Setting Pacing Amplitude: 2.25 V
Lead Channel Setting Pacing Pulse Width: 0.4 ms
Lead Channel Setting Pacing Pulse Width: 0.4 ms
Lead Channel Setting Sensing Sensitivity: 0.3 mV

## 2022-06-01 NOTE — Progress Notes (Signed)
Remote ICD transmission.   

## 2022-06-12 ENCOUNTER — Other Ambulatory Visit (HOSPITAL_COMMUNITY): Payer: Self-pay

## 2022-06-14 ENCOUNTER — Other Ambulatory Visit (HOSPITAL_COMMUNITY): Payer: Self-pay

## 2022-06-14 ENCOUNTER — Other Ambulatory Visit (HOSPITAL_COMMUNITY): Payer: Self-pay | Admitting: *Deleted

## 2022-06-14 MED ORDER — ENTRESTO 97-103 MG PO TABS: 1.0000 | ORAL_TABLET | Freq: Two times a day (BID) | ORAL | 3 refills | Status: DC

## 2022-06-21 DIAGNOSIS — I739 Peripheral vascular disease, unspecified: Secondary | ICD-10-CM | POA: Diagnosis not present

## 2022-06-21 DIAGNOSIS — G629 Polyneuropathy, unspecified: Secondary | ICD-10-CM | POA: Diagnosis not present

## 2022-06-21 DIAGNOSIS — M792 Neuralgia and neuritis, unspecified: Secondary | ICD-10-CM | POA: Diagnosis not present

## 2022-06-21 DIAGNOSIS — M2142 Flat foot [pes planus] (acquired), left foot: Secondary | ICD-10-CM | POA: Diagnosis not present

## 2022-06-21 DIAGNOSIS — M2141 Flat foot [pes planus] (acquired), right foot: Secondary | ICD-10-CM | POA: Diagnosis not present

## 2022-06-21 DIAGNOSIS — M7741 Metatarsalgia, right foot: Secondary | ICD-10-CM | POA: Diagnosis not present

## 2022-06-21 DIAGNOSIS — M7742 Metatarsalgia, left foot: Secondary | ICD-10-CM | POA: Diagnosis not present

## 2022-07-24 DIAGNOSIS — E213 Hyperparathyroidism, unspecified: Secondary | ICD-10-CM | POA: Diagnosis not present

## 2022-07-24 DIAGNOSIS — E559 Vitamin D deficiency, unspecified: Secondary | ICD-10-CM | POA: Diagnosis not present

## 2022-07-24 DIAGNOSIS — E039 Hypothyroidism, unspecified: Secondary | ICD-10-CM | POA: Diagnosis not present

## 2022-07-31 DIAGNOSIS — E213 Hyperparathyroidism, unspecified: Secondary | ICD-10-CM | POA: Diagnosis not present

## 2022-07-31 DIAGNOSIS — E559 Vitamin D deficiency, unspecified: Secondary | ICD-10-CM | POA: Diagnosis not present

## 2022-07-31 DIAGNOSIS — M859 Disorder of bone density and structure, unspecified: Secondary | ICD-10-CM | POA: Diagnosis not present

## 2022-07-31 DIAGNOSIS — E039 Hypothyroidism, unspecified: Secondary | ICD-10-CM | POA: Diagnosis not present

## 2022-07-31 DIAGNOSIS — I10 Essential (primary) hypertension: Secondary | ICD-10-CM | POA: Diagnosis not present

## 2022-07-31 DIAGNOSIS — E890 Postprocedural hypothyroidism: Secondary | ICD-10-CM | POA: Diagnosis not present

## 2022-08-08 DIAGNOSIS — E559 Vitamin D deficiency, unspecified: Secondary | ICD-10-CM | POA: Diagnosis not present

## 2022-08-08 DIAGNOSIS — Z23 Encounter for immunization: Secondary | ICD-10-CM | POA: Diagnosis not present

## 2022-08-08 DIAGNOSIS — J449 Chronic obstructive pulmonary disease, unspecified: Secondary | ICD-10-CM | POA: Diagnosis not present

## 2022-08-08 DIAGNOSIS — I428 Other cardiomyopathies: Secondary | ICD-10-CM | POA: Diagnosis not present

## 2022-08-08 DIAGNOSIS — Z95810 Presence of automatic (implantable) cardiac defibrillator: Secondary | ICD-10-CM | POA: Diagnosis not present

## 2022-08-08 DIAGNOSIS — I10 Essential (primary) hypertension: Secondary | ICD-10-CM | POA: Diagnosis not present

## 2022-08-08 DIAGNOSIS — E890 Postprocedural hypothyroidism: Secondary | ICD-10-CM | POA: Diagnosis not present

## 2022-08-08 DIAGNOSIS — E7800 Pure hypercholesterolemia, unspecified: Secondary | ICD-10-CM | POA: Diagnosis not present

## 2022-08-08 DIAGNOSIS — Z0000 Encounter for general adult medical examination without abnormal findings: Secondary | ICD-10-CM | POA: Diagnosis not present

## 2022-08-14 ENCOUNTER — Other Ambulatory Visit (HOSPITAL_COMMUNITY): Payer: Self-pay | Admitting: *Deleted

## 2022-08-14 MED ORDER — CARVEDILOL 12.5 MG PO TABS: 12.5000 mg | ORAL_TABLET | Freq: Two times a day (BID) | ORAL | 3 refills | Status: DC

## 2022-08-16 ENCOUNTER — Ambulatory Visit (INDEPENDENT_AMBULATORY_CARE_PROVIDER_SITE_OTHER): Payer: Medicare HMO

## 2022-08-16 DIAGNOSIS — I428 Other cardiomyopathies: Secondary | ICD-10-CM

## 2022-08-16 LAB — CUP PACEART REMOTE DEVICE CHECK
Battery Remaining Longevity: 15 mo
Battery Voltage: 2.88 V
Brady Statistic AP VP Percent: 15.61 %
Brady Statistic AP VS Percent: 0.25 %
Brady Statistic AS VP Percent: 82.96 %
Brady Statistic AS VS Percent: 1.17 %
Brady Statistic RA Percent Paced: 15.85 %
Brady Statistic RV Percent Paced: 95.93 %
Date Time Interrogation Session: 20231012033624
HighPow Impedance: 66 Ohm
Implantable Lead Implant Date: 20160610
Implantable Lead Implant Date: 20160610
Implantable Lead Implant Date: 20160610
Implantable Lead Location: 753858
Implantable Lead Location: 753859
Implantable Lead Location: 753860
Implantable Lead Model: 4396
Implantable Lead Model: 5076
Implantable Pulse Generator Implant Date: 20160610
Lead Channel Impedance Value: 361 Ohm
Lead Channel Impedance Value: 399 Ohm
Lead Channel Impedance Value: 399 Ohm
Lead Channel Impedance Value: 475 Ohm
Lead Channel Impedance Value: 551 Ohm
Lead Channel Impedance Value: 665 Ohm
Lead Channel Pacing Threshold Amplitude: 0.5 V
Lead Channel Pacing Threshold Amplitude: 1.25 V
Lead Channel Pacing Threshold Amplitude: 1.625 V
Lead Channel Pacing Threshold Pulse Width: 0.4 ms
Lead Channel Pacing Threshold Pulse Width: 0.4 ms
Lead Channel Pacing Threshold Pulse Width: 0.4 ms
Lead Channel Sensing Intrinsic Amplitude: 1.25 mV
Lead Channel Sensing Intrinsic Amplitude: 1.25 mV
Lead Channel Sensing Intrinsic Amplitude: 15.75 mV
Lead Channel Sensing Intrinsic Amplitude: 15.75 mV
Lead Channel Setting Pacing Amplitude: 2 V
Lead Channel Setting Pacing Amplitude: 2.5 V
Lead Channel Setting Pacing Amplitude: 2.75 V
Lead Channel Setting Pacing Pulse Width: 0.4 ms
Lead Channel Setting Pacing Pulse Width: 0.4 ms
Lead Channel Setting Sensing Sensitivity: 0.3 mV

## 2022-08-22 ENCOUNTER — Other Ambulatory Visit (HOSPITAL_COMMUNITY): Payer: Self-pay | Admitting: Cardiology

## 2022-08-23 NOTE — Progress Notes (Signed)
Remote ICD transmission.   

## 2022-09-14 ENCOUNTER — Encounter (HOSPITAL_COMMUNITY): Payer: Self-pay | Admitting: Cardiology

## 2022-09-14 ENCOUNTER — Ambulatory Visit (HOSPITAL_COMMUNITY)
Admission: RE | Admit: 2022-09-14 | Discharge: 2022-09-14 | Disposition: A | Discharge status: Home / Self Care | Payer: Medicare HMO | Source: Ambulatory Visit | Attending: Cardiology | Admitting: Cardiology

## 2022-09-14 VITALS — BP 110/70 | HR 71 | Wt 187.4 lb

## 2022-09-14 DIAGNOSIS — Z79899 Other long term (current) drug therapy: Secondary | ICD-10-CM | POA: Diagnosis not present

## 2022-09-14 DIAGNOSIS — I447 Left bundle-branch block, unspecified: Secondary | ICD-10-CM | POA: Insufficient documentation

## 2022-09-14 DIAGNOSIS — I2510 Atherosclerotic heart disease of native coronary artery without angina pectoris: Secondary | ICD-10-CM | POA: Insufficient documentation

## 2022-09-14 DIAGNOSIS — I5022 Chronic systolic (congestive) heart failure: Secondary | ICD-10-CM | POA: Diagnosis not present

## 2022-09-14 DIAGNOSIS — I110 Hypertensive heart disease with heart failure: Secondary | ICD-10-CM | POA: Insufficient documentation

## 2022-09-14 DIAGNOSIS — E039 Hypothyroidism, unspecified: Secondary | ICD-10-CM | POA: Insufficient documentation

## 2022-09-14 DIAGNOSIS — Z7982 Long term (current) use of aspirin: Secondary | ICD-10-CM | POA: Diagnosis not present

## 2022-09-14 DIAGNOSIS — J449 Chronic obstructive pulmonary disease, unspecified: Secondary | ICD-10-CM | POA: Diagnosis not present

## 2022-09-14 DIAGNOSIS — E785 Hyperlipidemia, unspecified: Secondary | ICD-10-CM | POA: Insufficient documentation

## 2022-09-14 DIAGNOSIS — I428 Other cardiomyopathies: Secondary | ICD-10-CM | POA: Insufficient documentation

## 2022-09-14 DIAGNOSIS — E669 Obesity, unspecified: Secondary | ICD-10-CM | POA: Diagnosis not present

## 2022-09-14 DIAGNOSIS — Z6834 Body mass index (BMI) 34.0-34.9, adult: Secondary | ICD-10-CM | POA: Diagnosis not present

## 2022-09-14 DIAGNOSIS — G4733 Obstructive sleep apnea (adult) (pediatric): Secondary | ICD-10-CM | POA: Insufficient documentation

## 2022-09-14 DIAGNOSIS — Z87891 Personal history of nicotine dependence: Secondary | ICD-10-CM | POA: Diagnosis not present

## 2022-09-14 DIAGNOSIS — R079 Chest pain, unspecified: Secondary | ICD-10-CM | POA: Insufficient documentation

## 2022-09-14 LAB — BASIC METABOLIC PANEL
Anion gap: 8 (ref 5–15)
BUN: 16 mg/dL (ref 8–23)
CO2: 28 mmol/L (ref 22–32)
Calcium: 9.7 mg/dL (ref 8.9–10.3)
Chloride: 105 mmol/L (ref 98–111)
Creatinine, Ser: 1.01 mg/dL — ABNORMAL HIGH (ref 0.44–1.00)
GFR, Estimated: >60 mL/min (ref >60)
Glucose, Bld: 100 mg/dL — ABNORMAL HIGH (ref 70–99)
Potassium: 4.4 mmol/L (ref 3.5–5.1)
Sodium: 141 mmol/L (ref 135–145)

## 2022-09-14 NOTE — Patient Instructions (Signed)
There has been no changes to your medications.  Labs done today, your results will be available in MyChart, we will contact you for abnormal readings.  Repeat lab work in 3 months in [place].  Your physician has requested that you have an echocardiogram. Echocardiography is a painless test that uses sound waves to create images of your heart. It provides your doctor with information about the size and shape of your heart and how well your heart's chambers and valves are working. This procedure takes approximately one hour. There are no restrictions for this procedure. Please do NOT wear cologne, perfume, aftershave, or lotions (deodorant is allowed). Please arrive 15 minutes prior to your appointment time. YOU WILL BE CALLED TO HAVE THIS TEST ARRANGED.  Your physician recommends that you schedule a follow-up appointment in: 1 year (November 2024) ** please call the office in August to arrange your follow up appointment **  If you have any questions or concerns before your next appointment please send [place] a message through [place] or call our office at [phone].    TO LEAVE A MESSAGE FOR THE NURSE SELECT OPTION 2, PLEASE LEAVE A MESSAGE INCLUDING: YOUR NAME DATE OF BIRTH CALL BACK NUMBER REASON FOR CALL**this is important as we prioritize the call backs  YOU WILL RECEIVE A CALL BACK THE SAME DAY AS LONG AS YOU CALL BEFORE 4:00 PM  At the [facility], you and your health needs are our priority. As part of our continuing mission to provide you with exceptional heart care, we have created designated Provider Care Teams. These Care Teams include your primary Cardiologist (physician) and Advanced Practice Providers (APPs- Physician Assistants and Nurse Practitioners) who all work together to provide you with the care you need, when you need it.   You may see any of the following providers on your designated Care Team at your next follow up: Dr Glori Bickers Dr Loralie Champagne Dr. Roxana Hires, NP Lyda Jester, [place] [facility] [place], [place] Forestine Na, NP Audry Riles, PharmD   Please be sure to bring in all your medications bottles to every appointment.

## 2022-09-15 ENCOUNTER — Other Ambulatory Visit: Payer: Self-pay | Admitting: Family Medicine

## 2022-09-16 NOTE — Progress Notes (Signed)
Date:  09/16/2022   ID:  Verdie Shire, DOB [birth year], MRN [id number]   Provider location: [facility] Type of Visit: Established patient  PCP:  Deland Pretty, MD  Cardiologist: Dr. Aundra Dubin   History of Present Illness: Stacy Hernandez is a 66 y.o. female who has a history of HTN, intermittent LBBB, and nonischemic cardiomyopathy.  She was admitted with chest pain in 6/13.  ECG showed intermittent LBBB.  Echo showed EF 45-50%.  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     Developed CP and dyspnea. She was set up for echo and Cardiolite in 12/15.  Echo showed EF 10-15% (significant fall).  She had a Lexiscan Cardiolite with EF 25% and apical anteroseptal and apical fixed perfusion defect.  Therefore, she was sent for left and right heart catheterization in 1/16.  This showed mild elevation in LV and RV filling pressures and no significant CAD.  She was started on Lasix 20 mg daily.  Echo in 5/16 showed EF 20-25%, mildly dilated and mildly dysfunctional RV.  PFTs 4/16 showed severe obstruction and restriction.  She has seen Dr Melvyn Novas.  She is noted to have an elevated right hemidiaphragm.    In 6/16, she had Medtronic CRT-D system placed.  In 7/16, CPX showed low normal functional capacity with the main limitation being pulmonary.  Repeat echo in 11/16 showed improvement in EF to 40-45%.  Echo in 7/18 showed stable EF 40-45%.  Echo in 7/19 showed EF up to 50-55%.  Echo in 10/20 showed EF 50-55% with mildly decreased RV systolic function.  Echo in 6/22 showed EF 56%, normal RV.    She returns today for followup of CHF.  Weight is down 3 lbs.  She is doing well in general.  No palpitations.  No significant exertional dyspnea.  She works outside in her yard.  No chest pain.  Husband has dementia and she has been caring for him.    ECG (personally reviewed): NSR, BiV paced  Medtronic device interrogation: no VT, >99% BiV pacing   Labs (12/13): LDL 80, HDL 46,  K 4, creatinine 0.7 Labs (6/14): K 4, creatinine 0.8, LDL 73, HDL 45 Labs (12/15): K 4.2, creatinine 0.7, HCT 43.3, LDL 60, HDL 51, BNP 577, TSH normal Labs (2/16): K 4.2, creatinine 0.7 Labs (1/16): SPEP no M spike K 4.3 Creatinine 0.74  Labs (2/16): K 4.2, creatinine 0.78 Labs (02/23/2015) : K 4.2 Creatinine 0.90, BNP 339 Labs (04/13/2015): K 4.5 Creatinine 1.01  Labs (05/17/2015): K 4.7 Creatinine 0.71  Labs (11/16): K 4.1, creatinine 0.74 Labs (6/17): K 4.2, creatinine 0.85, BNP 76 Labs (1/18): LDL 55, HDL 42 Labs (7/18): K 3.9, creatinine 0.91 Labs (1/19): K 4, creatinine 1.0, LDL 59, hgb 13.7 Labs (10/19): LDL 70 Labs (1/20): K 4.2, creatinine 0.96 Labs (7/20): K 4, creatinine 1.1, LDL 76, hgb 13.2 Labs (8/21): K 5.5, creatinine 1.1, LDL 73, hgb 12.7 Labs (2/22): LDL 73, K 4.4, creatinine 0.9 Labs (4/22): K 4, creatinine 0.9 Labs (9/23): creatinine 0.79   PMH: 1. HTN 2. Hyperlipidemia 3. COPD: Prior smoker.  PFTs (4/16) with FVC 37%, FEV1 36%, ratio 97%, TLC 67%, DLCO 32% => severe obstruction and restriction.  COPD + elevated right hemidiaphragm.  4. Intermittent LBBB/IVCD 5. Hypothyroidism 6. Cardiomyopathy: Nonischemic.  Echo (6/13) with EF 45-50%, paradoxical septal motion.  LHC (6/13) with 40% D1 stenosis, 30% PLV stenosis.  Echo (12/15) with EF 10-15%, moderately dilated LV, diffuse hypokinesis,  moderate MR.   Lexiscan Cardiolite (12/15) with EF 25%, diffuse hypokinesis, apical anteroseptal and apical fixed defect. LHC/RHC (1/16) with mean RA 8, PA 37/19, mean PCWP 19, CI 2.63, EF 30%, no significant CAD.  Cardiac MRI 12/20/2014: EF 24% Mod mitral regurg thought to be from dilated LV, normal RV size, no LGE.  Echo (5/16) with EF 20-25%, mildly dilated and mildly dysfunctional RV, mild MR. Medtronic CRT-D placed 6/16.  CPX (7/16) with peak VO2 16.3, VE/VCO2 slope 25, RER 1.1, low normal functional capacity limited primarily by lung disease.  - Echo (11/16) with EF 40-45%, mild MR.   - Echo (7/18): EF 40-45%, diffuse hypokinesis, grade II diastolic dysfunction.  - Echo (7/19): EF 50-55%, mild MR, PASP 37 mmHg.  - Echo (10/20): EF 50-55%, septal bounce, mildly decreased RV systolic function, PASP 26 mmHg.  - Echo (6/22): EF 56%, normal RV.  7. 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  8. OSA: 04/18/2015 Sleep Study. She has not been able to tolerate CPAP. 9. ABIs (12/18): Normal  Current Outpatient Medications  Medication Sig Dispense Refill   albuterol (VENTOLIN HFA) 108 (90 Base) MCG/ACT inhaler Inhale 2 puffs into the lungs every 4 (four) hours as needed.     aspirin 81 MG tablet Take 81 mg by mouth at bedtime.      atorvastatin (LIPITOR) 10 MG tablet TAKE 1 TABLET BY MOUTH EVERYDAY AT BEDTIME 90 tablet 3   carvedilol (COREG) 12.5 MG tablet Take 1 tablet (12.5 mg total) by mouth 2 (two) times daily. 180 tablet 3   cholecalciferol (VITAMIN D) 400 units TABS tablet Take 400 Units by mouth daily.     gabapentin (NEURONTIN) 300 MG capsule Take 600 mg by mouth daily.     levothyroxine (SYNTHROID, LEVOTHROID) 100 MCG tablet Take 100 mcg by mouth daily before breakfast.     Multiple Vitamins-Minerals (MULTIVITAMIN WITH MINERALS) tablet Take 1 tablet by mouth daily.     niacin 500 MG tablet Take 1,500 mg by mouth at bedtime.     nitroGLYCERIN (NITROSTAT) 0.4 MG SL tablet Place 1 tablet (0.4 mg total) under the tongue every 5 (five) minutes as needed for chest pain. 25 tablet 3   Phenylephrine-APAP-guaiFENesin (MUCINEX SINUS-MAX PO) Take 1 tablet by mouth daily as needed (sinuses).     sacubitril-valsartan (ENTRESTO) 97-103 MG Take 1 tablet by mouth 2 (two) times daily. 180 tablet 3   spironolactone (ALDACTONE) 25 MG tablet TAKE 1 TABLET BY MOUTH EVERY DAY 90 tablet 3   traMADol (ULTRAM) 50 MG tablet Take 50 mg by mouth every evening.     No current facility-administered medications for this encounter.    Allergies:   Prednisone   Social History:   The patient  reports that she quit smoking about 7 years ago. Her smoking use included cigarettes. She has a 70.00 pack-year smoking history. She has never used smokeless tobacco. She reports that she does not drink alcohol and does not use drugs.   Family History:  The patient's family history includes COPD in her mother; Cirrhosis in her sister; Colon cancer (age of onset: 45) in her maternal uncle; Colon polyps in her mother; Coronary artery disease in her father; Diabetes in her sister; Heart attack in her father; Hypertension in her father.   ROS:  Please see the history of present illness.   All other systems are personally reviewed and negative.   Exam:   BP 110/70   Pulse 71   Wt 85  kg (187 lb 6.4 oz)   BMI 34.28 kg/m   General: NAD Neck: No JVD, no thyromegaly or thyroid nodule.  Lungs: Clear to auscultation bilaterally with normal respiratory effort. CV: Nondisplaced PMI.  Heart regular S1/S2, no S3/S4, no murmur.  No peripheral edema.  No carotid bruit.  Normal pedal pulses.  Abdomen: Soft, nontender, no hepatosplenomegaly, no distention.  Skin: Intact without lesions or rashes.  Neurologic: Alert and oriented x 3.  Psych: Normal affect. Extremities: No clubbing or cyanosis.  HEENT: Normal.   Recent Labs: 09/14/2022: BUN 16; Creatinine, Ser 1.01; Potassium 4.4; Sodium 141  Personally reviewed   Wt Readings from Last 3 Encounters:  09/14/22 85 kg (187 lb 6.4 oz)  09/07/21 86.2 kg (190 lb)  02/03/21 78 kg (172 lb)      ASSESSMENT AND PLAN:  1. Cardiomyopathy: Nonischemic cardiomyopathy. In the past, she had EF 45-50% with no significant CAD (2013).  LBBB-related nonischemic cardiomyopathy.  However, in the fall of 2015 she developed exertional chest pain and dyspnea.  Echo and Cardiolite showed marked worsening of her LV systolic function (EF 83-72% by echo) and Cardiolite suggested possible prior MI.  Left heart cath in 1/16 showed no significant CAD.  RHC showed mildly  elevated right and left heart filling pressures and normal CI. SPEP negative and no infiltrative disease on cardiac MRI. Repeat echo in 5/16 with EF 20-25%.  CPX showed limitations are related to lung disease as well as obesity, no HF limitation. Medtronic CRT-D placement.  Followup echo in 11/16 showed improvement in EF to 40-45%.  Echo in 10/20 showed EF 50-55%.  Echo in 6/22 with EF up to 56%.  She is not volume overloaded on exam or by Optivol, NYHA class II symptoms.   - Continue Coreg 12.5 mg bid.   - Continue Entresto to 97/103 bid.   - Continue spironolactone 25 mg daily.   - She will need BMET every 3 months to follow K and creatinine given her medication regimen.  Will get BMET today.  - I will arrange for echo to make sure EF remains in normal range.  2. Elevated right hemidiaphragm: Chronic issue.  3. OSA:  Has not been able to tolerate CPAP.    4. COPD: Severe by PFTs. No longer smokes.  5. Hyperlipidemia: Check lipids.   Recommended follow-up: 1 year.   Signed, Loralie Champagne, MD  09/16/2022  [facility] [address] [facility] [place] [address] [phone] (office) [phone] (fax)

## 2022-09-18 ENCOUNTER — Ambulatory Visit: Payer: Medicare HMO | Attending: Cardiology

## 2022-09-18 DIAGNOSIS — I5022 Chronic systolic (congestive) heart failure: Secondary | ICD-10-CM

## 2022-09-18 LAB — ECHOCARDIOGRAM COMPLETE
AR max vel: 1.02 cm2
AV Peak grad: 12 mmHg
Ao pk vel: 1.73 m/s
Area-P 1/2: 3.54 cm2
Calc EF: 55.2 %
MV M vel: 3.41 m/s
MV Peak grad: 46.5 mmHg
S' Lateral: 3.7 cm
Single Plane A2C EF: 56.3 %
Single Plane A4C EF: 55 %

## 2022-09-19 ENCOUNTER — Telehealth (HOSPITAL_COMMUNITY): Payer: Self-pay

## 2022-09-19 ENCOUNTER — Other Ambulatory Visit (HOSPITAL_COMMUNITY): Payer: Self-pay

## 2022-09-19 NOTE — Telephone Encounter (Signed)
Advanced Heart Failure Patient Advocate Encounter   Received renewal notification for [employer]). This patient is eligible for a grant that is currently open and would cover the cost of this medication.   Left voicemail for patient to call back to start application process.   Clista Bernhardt, CPhT Rx Patient Advocate Phone: [phone]

## 2022-09-19 NOTE — Telephone Encounter (Signed)
Advanced Heart Failure Patient Advocate Encounter  The patient was approved for a [employer] that will help cover the cost of Entresto.  Total amount awarded, $10,000.  Effective: 08/20/2022 - 08/20/2023.  BIN [id number] PCN [id number] [id number] ID [id number]  New prescription(s) sent to [facility]. Patient provided with approval and processing information via mail.  Clista Bernhardt, CPhT Rx Patient Advocate Phone: [phone]

## 2022-09-25 ENCOUNTER — Other Ambulatory Visit: Payer: Self-pay | Admitting: Internal Medicine

## 2022-09-25 ENCOUNTER — Other Ambulatory Visit (HOSPITAL_COMMUNITY): Payer: Self-pay | Admitting: *Deleted

## 2022-09-25 ENCOUNTER — Other Ambulatory Visit (HOSPITAL_COMMUNITY): Payer: Self-pay | Admitting: Cardiology

## 2022-09-25 DIAGNOSIS — Z1231 Encounter for screening mammogram for malignant neoplasm of breast: Secondary | ICD-10-CM

## 2022-11-15 ENCOUNTER — Ambulatory Visit (INDEPENDENT_AMBULATORY_CARE_PROVIDER_SITE_OTHER): Payer: Medicare HMO

## 2022-11-15 DIAGNOSIS — I428 Other cardiomyopathies: Secondary | ICD-10-CM | POA: Diagnosis not present

## 2022-11-19 LAB — CUP PACEART REMOTE DEVICE CHECK
Battery Remaining Longevity: 10 mo
Battery Voltage: 2.85 V
Brady Statistic AP VP Percent: 15.6 %
Brady Statistic AP VS Percent: 0.27 %
Brady Statistic AS VP Percent: 82.95 %
Brady Statistic AS VS Percent: 1.19 %
Brady Statistic RA Percent Paced: 15.85 %
Brady Statistic RV Percent Paced: 97.38 %
Date Time Interrogation Session: 20240112213525
HighPow Impedance: 71 Ohm
Implantable Lead Connection Status: 753985
Implantable Lead Connection Status: 753985
Implantable Lead Connection Status: 753985
Implantable Lead Implant Date: 20160610
Implantable Lead Implant Date: 20160610
Implantable Lead Implant Date: 20160610
Implantable Lead Location: 753858
Implantable Lead Location: 753859
Implantable Lead Location: 753860
Implantable Lead Model: 4396
Implantable Lead Model: 5076
Implantable Pulse Generator Implant Date: 20160610
Lead Channel Impedance Value: 361 Ohm
Lead Channel Impedance Value: 361 Ohm
Lead Channel Impedance Value: 456 Ohm
Lead Channel Impedance Value: 475 Ohm
Lead Channel Impedance Value: 532 Ohm
Lead Channel Impedance Value: 722 Ohm
Lead Channel Pacing Threshold Amplitude: 0.5 V
Lead Channel Pacing Threshold Amplitude: 1.25 V
Lead Channel Pacing Threshold Amplitude: 1.375 V
Lead Channel Pacing Threshold Pulse Width: 0.4 ms
Lead Channel Pacing Threshold Pulse Width: 0.4 ms
Lead Channel Pacing Threshold Pulse Width: 0.4 ms
Lead Channel Sensing Intrinsic Amplitude: 1.25 mV
Lead Channel Sensing Intrinsic Amplitude: 1.25 mV
Lead Channel Sensing Intrinsic Amplitude: 16.125 mV
Lead Channel Sensing Intrinsic Amplitude: 16.125 mV
Lead Channel Setting Pacing Amplitude: 2 V
Lead Channel Setting Pacing Amplitude: 2.5 V
Lead Channel Setting Pacing Amplitude: 2.75 V
Lead Channel Setting Pacing Pulse Width: 0.4 ms
Lead Channel Setting Pacing Pulse Width: 0.4 ms
Lead Channel Setting Sensing Sensitivity: 0.3 mV
Zone Setting Status: 755011

## 2022-11-26 ENCOUNTER — Ambulatory Visit
Admission: RE | Admit: 2022-11-26 | Discharge: 2022-11-26 | Disposition: A | Discharge status: Home / Self Care | Payer: Medicare HMO | Source: Ambulatory Visit | Attending: Internal Medicine | Admitting: Internal Medicine

## 2022-11-26 DIAGNOSIS — Z1231 Encounter for screening mammogram for malignant neoplasm of breast: Secondary | ICD-10-CM

## 2022-12-04 DIAGNOSIS — M62830 Muscle spasm of back: Secondary | ICD-10-CM | POA: Diagnosis not present

## 2022-12-05 NOTE — Progress Notes (Signed)
Remote ICD transmission.   

## 2022-12-13 ENCOUNTER — Other Ambulatory Visit (HOSPITAL_COMMUNITY)
Admission: RE | Admit: 2022-12-13 | Discharge: 2022-12-13 | Disposition: A | Discharge status: Home / Self Care | Payer: Medicare HMO | Source: Ambulatory Visit | Attending: Cardiology | Admitting: Cardiology

## 2022-12-13 DIAGNOSIS — I5022 Chronic systolic (congestive) heart failure: Secondary | ICD-10-CM | POA: Insufficient documentation

## 2022-12-13 LAB — BASIC METABOLIC PANEL
Anion gap: 7 (ref 5–15)
BUN: 14 mg/dL (ref 8–23)
CO2: 27 mmol/L (ref 22–32)
Calcium: 8.9 mg/dL (ref 8.9–10.3)
Chloride: 104 mmol/L (ref 98–111)
Creatinine, Ser: 0.81 mg/dL (ref 0.44–1.00)
GFR, Estimated: >60 mL/min (ref >60)
Glucose, Bld: 105 mg/dL — ABNORMAL HIGH (ref 70–99)
Potassium: 4.1 mmol/L (ref 3.5–5.1)
Sodium: 138 mmol/L (ref 135–145)

## 2022-12-26 DIAGNOSIS — I739 Peripheral vascular disease, unspecified: Secondary | ICD-10-CM | POA: Diagnosis not present

## 2022-12-26 DIAGNOSIS — M792 Neuralgia and neuritis, unspecified: Secondary | ICD-10-CM | POA: Diagnosis not present

## 2022-12-26 DIAGNOSIS — G629 Polyneuropathy, unspecified: Secondary | ICD-10-CM | POA: Diagnosis not present

## 2022-12-26 DIAGNOSIS — M2142 Flat foot [pes planus] (acquired), left foot: Secondary | ICD-10-CM | POA: Diagnosis not present

## 2022-12-26 DIAGNOSIS — M2141 Flat foot [pes planus] (acquired), right foot: Secondary | ICD-10-CM | POA: Diagnosis not present

## 2022-12-26 DIAGNOSIS — M7741 Metatarsalgia, right foot: Secondary | ICD-10-CM | POA: Diagnosis not present

## 2022-12-26 DIAGNOSIS — M7742 Metatarsalgia, left foot: Secondary | ICD-10-CM | POA: Diagnosis not present

## 2023-02-04 ENCOUNTER — Other Ambulatory Visit (HOSPITAL_COMMUNITY): Payer: Self-pay | Admitting: Cardiology

## 2023-02-05 DIAGNOSIS — J45909 Unspecified asthma, uncomplicated: Secondary | ICD-10-CM | POA: Diagnosis not present

## 2023-02-07 DIAGNOSIS — E7800 Pure hypercholesterolemia, unspecified: Secondary | ICD-10-CM | POA: Diagnosis not present

## 2023-02-14 ENCOUNTER — Ambulatory Visit (INDEPENDENT_AMBULATORY_CARE_PROVIDER_SITE_OTHER): Payer: Medicare HMO

## 2023-02-14 DIAGNOSIS — I428 Other cardiomyopathies: Secondary | ICD-10-CM

## 2023-02-14 LAB — CUP PACEART REMOTE DEVICE CHECK
Battery Remaining Longevity: 7 mo
Battery Voltage: 2.84 V
Brady Statistic AP VP Percent: 10.85 %
Brady Statistic AP VS Percent: 0.19 %
Brady Statistic AS VP Percent: 87.7 %
Brady Statistic AS VS Percent: 1.26 %
Brady Statistic RA Percent Paced: 11.03 %
Brady Statistic RV Percent Paced: 97.35 %
Date Time Interrogation Session: 20240411044224
HighPow Impedance: 77 Ohm
Implantable Lead Connection Status: 753985
Implantable Lead Connection Status: 753985
Implantable Lead Connection Status: 753985
Implantable Lead Implant Date: 20160610
Implantable Lead Implant Date: 20160610
Implantable Lead Implant Date: 20160610
Implantable Lead Location: 753858
Implantable Lead Location: 753859
Implantable Lead Location: 753860
Implantable Lead Model: 4396
Implantable Lead Model: 5076
Implantable Pulse Generator Implant Date: 20160610
Lead Channel Impedance Value: 399 Ohm
Lead Channel Impedance Value: 399 Ohm
Lead Channel Impedance Value: 456 Ohm
Lead Channel Impedance Value: 551 Ohm
Lead Channel Impedance Value: 589 Ohm
Lead Channel Impedance Value: 703 Ohm
Lead Channel Pacing Threshold Amplitude: 0.375 V
Lead Channel Pacing Threshold Amplitude: 1.125 V
Lead Channel Pacing Threshold Amplitude: 1.375 V
Lead Channel Pacing Threshold Pulse Width: 0.4 ms
Lead Channel Pacing Threshold Pulse Width: 0.4 ms
Lead Channel Pacing Threshold Pulse Width: 0.4 ms
Lead Channel Sensing Intrinsic Amplitude: 1.5 mV
Lead Channel Sensing Intrinsic Amplitude: 1.5 mV
Lead Channel Sensing Intrinsic Amplitude: 17.25 mV
Lead Channel Sensing Intrinsic Amplitude: 17.25 mV
Lead Channel Setting Pacing Amplitude: 2 V
Lead Channel Setting Pacing Amplitude: 2.25 V
Lead Channel Setting Pacing Amplitude: 2.75 V
Lead Channel Setting Pacing Pulse Width: 0.4 ms
Lead Channel Setting Pacing Pulse Width: 0.4 ms
Lead Channel Setting Sensing Sensitivity: 0.3 mV
Zone Setting Status: 755011

## 2023-02-19 ENCOUNTER — Other Ambulatory Visit (HOSPITAL_COMMUNITY): Payer: Self-pay | Admitting: Cardiology

## 2023-03-11 DIAGNOSIS — M62830 Muscle spasm of back: Secondary | ICD-10-CM | POA: Diagnosis not present

## 2023-03-11 DIAGNOSIS — M5136 Other intervertebral disc degeneration, lumbar region: Secondary | ICD-10-CM | POA: Diagnosis not present

## 2023-03-11 DIAGNOSIS — M47816 Spondylosis without myelopathy or radiculopathy, lumbar region: Secondary | ICD-10-CM | POA: Diagnosis not present

## 2023-03-21 NOTE — Progress Notes (Signed)
Remote ICD transmission.   

## 2023-03-26 DIAGNOSIS — M25512 Pain in left shoulder: Secondary | ICD-10-CM | POA: Diagnosis not present

## 2023-03-27 ENCOUNTER — Other Ambulatory Visit: Payer: Self-pay | Admitting: Otolaryngology

## 2023-03-27 DIAGNOSIS — M25512 Pain in left shoulder: Secondary | ICD-10-CM

## 2023-04-16 ENCOUNTER — Ambulatory Visit: Payer: Medicare HMO

## 2023-04-16 DIAGNOSIS — I428 Other cardiomyopathies: Secondary | ICD-10-CM

## 2023-04-16 LAB — CUP PACEART REMOTE DEVICE CHECK
Battery Remaining Longevity: 6 mo
Battery Voltage: 2.82 V
Brady Statistic AP VP Percent: 18.61 %
Brady Statistic AP VS Percent: 0.33 %
Brady Statistic AS VP Percent: 79.93 %
Brady Statistic AS VS Percent: 1.13 %
Brady Statistic RA Percent Paced: 18.92 %
Brady Statistic RV Percent Paced: 91.92 %
Date Time Interrogation Session: 20240611054346
HighPow Impedance: 74 Ohm
Implantable Lead Connection Status: 753985
Implantable Lead Connection Status: 753985
Implantable Lead Connection Status: 753985
Implantable Lead Implant Date: 20160610
Implantable Lead Implant Date: 20160610
Implantable Lead Implant Date: 20160610
Implantable Lead Location: 753858
Implantable Lead Location: 753859
Implantable Lead Location: 753860
Implantable Lead Model: 4396
Implantable Lead Model: 5076
Implantable Pulse Generator Implant Date: 20160610
Lead Channel Impedance Value: 361 Ohm
Lead Channel Impedance Value: 418 Ohm
Lead Channel Impedance Value: 418 Ohm
Lead Channel Impedance Value: 532 Ohm
Lead Channel Impedance Value: 589 Ohm
Lead Channel Impedance Value: 703 Ohm
Lead Channel Pacing Threshold Amplitude: 0.375 V
Lead Channel Pacing Threshold Amplitude: 1.375 V
Lead Channel Pacing Threshold Amplitude: 1.375 V
Lead Channel Pacing Threshold Pulse Width: 0.4 ms
Lead Channel Pacing Threshold Pulse Width: 0.4 ms
Lead Channel Pacing Threshold Pulse Width: 0.4 ms
Lead Channel Sensing Intrinsic Amplitude: 1.25 mV
Lead Channel Sensing Intrinsic Amplitude: 1.25 mV
Lead Channel Sensing Intrinsic Amplitude: 15.5 mV
Lead Channel Sensing Intrinsic Amplitude: 15.5 mV
Lead Channel Setting Pacing Amplitude: 2 V
Lead Channel Setting Pacing Amplitude: 2.5 V
Lead Channel Setting Pacing Amplitude: 2.75 V
Lead Channel Setting Pacing Pulse Width: 0.4 ms
Lead Channel Setting Pacing Pulse Width: 0.4 ms
Lead Channel Setting Sensing Sensitivity: 0.3 mV
Zone Setting Status: 755011

## 2023-04-19 ENCOUNTER — Ambulatory Visit
Admission: RE | Admit: 2023-04-19 | Discharge: 2023-04-19 | Disposition: A | Discharge status: Home / Self Care | Payer: Medicare HMO | Source: Ambulatory Visit | Attending: Otolaryngology | Admitting: Otolaryngology

## 2023-04-19 DIAGNOSIS — M19012 Primary osteoarthritis, left shoulder: Secondary | ICD-10-CM | POA: Diagnosis not present

## 2023-04-19 DIAGNOSIS — M25512 Pain in left shoulder: Secondary | ICD-10-CM

## 2023-04-19 MED ORDER — IOPAMIDOL (ISOVUE-M 200) INJECTION 41%
13.0000 mL | Freq: Once | INTRAMUSCULAR | Status: AC
  Administered 2023-04-19: 13 mL via INTRA_ARTICULAR

## 2023-04-23 DIAGNOSIS — M7542 Impingement syndrome of left shoulder: Secondary | ICD-10-CM | POA: Diagnosis not present

## 2023-04-26 DIAGNOSIS — M25512 Pain in left shoulder: Secondary | ICD-10-CM | POA: Diagnosis not present

## 2023-04-27 ENCOUNTER — Other Ambulatory Visit: Payer: Self-pay | Admitting: Cardiology

## 2023-04-29 DIAGNOSIS — M25512 Pain in left shoulder: Secondary | ICD-10-CM | POA: Diagnosis not present

## 2023-05-01 DIAGNOSIS — M25512 Pain in left shoulder: Secondary | ICD-10-CM | POA: Diagnosis not present

## 2023-05-06 DIAGNOSIS — M25512 Pain in left shoulder: Secondary | ICD-10-CM | POA: Diagnosis not present

## 2023-05-08 DIAGNOSIS — M25512 Pain in left shoulder: Secondary | ICD-10-CM | POA: Diagnosis not present

## 2023-05-13 DIAGNOSIS — M25512 Pain in left shoulder: Secondary | ICD-10-CM | POA: Diagnosis not present

## 2023-05-14 NOTE — Progress Notes (Signed)
Remote ICD transmission.   

## 2023-05-15 DIAGNOSIS — M25512 Pain in left shoulder: Secondary | ICD-10-CM | POA: Diagnosis not present

## 2023-05-16 ENCOUNTER — Ambulatory Visit: Payer: Medicare HMO

## 2023-05-16 DIAGNOSIS — I428 Other cardiomyopathies: Secondary | ICD-10-CM | POA: Diagnosis not present

## 2023-05-16 LAB — CUP PACEART REMOTE DEVICE CHECK
Battery Remaining Longevity: 5 mo
Battery Voltage: 2.8 V
Brady Statistic AP VP Percent: 23.59 %
Brady Statistic AP VS Percent: 0.43 %
Brady Statistic AS VP Percent: 74.89 %
Brady Statistic AS VS Percent: 1.09 %
Brady Statistic RA Percent Paced: 23.98 %
Brady Statistic RV Percent Paced: 85.84 %
Date Time Interrogation Session: 20240711043724
HighPow Impedance: 72 Ohm
Implantable Lead Connection Status: 753985
Implantable Lead Connection Status: 753985
Implantable Lead Connection Status: 753985
Implantable Lead Implant Date: 20160610
Implantable Lead Implant Date: 20160610
Implantable Lead Implant Date: 20160610
Implantable Lead Location: 753858
Implantable Lead Location: 753859
Implantable Lead Location: 753860
Implantable Lead Model: 4396
Implantable Lead Model: 5076
Implantable Pulse Generator Implant Date: 20160610
Lead Channel Impedance Value: 361 Ohm
Lead Channel Impedance Value: 361 Ohm
Lead Channel Impedance Value: 399 Ohm
Lead Channel Impedance Value: 513 Ohm
Lead Channel Impedance Value: 551 Ohm
Lead Channel Impedance Value: 646 Ohm
Lead Channel Pacing Threshold Amplitude: 0.375 V
Lead Channel Pacing Threshold Amplitude: 1.5 V
Lead Channel Pacing Threshold Amplitude: 1.625 V
Lead Channel Pacing Threshold Pulse Width: 0.4 ms
Lead Channel Pacing Threshold Pulse Width: 0.4 ms
Lead Channel Pacing Threshold Pulse Width: 0.4 ms
Lead Channel Sensing Intrinsic Amplitude: 1.375 mV
Lead Channel Sensing Intrinsic Amplitude: 1.375 mV
Lead Channel Sensing Intrinsic Amplitude: 13.5 mV
Lead Channel Sensing Intrinsic Amplitude: 13.5 mV
Lead Channel Setting Pacing Amplitude: 2 V
Lead Channel Setting Pacing Amplitude: 2.5 V
Lead Channel Setting Pacing Amplitude: 3.25 V
Lead Channel Setting Pacing Pulse Width: 0.4 ms
Lead Channel Setting Pacing Pulse Width: 0.4 ms
Lead Channel Setting Sensing Sensitivity: 0.3 mV
Zone Setting Status: 755011

## 2023-06-03 NOTE — Progress Notes (Signed)
Remote ICD transmission.   

## 2023-06-17 ENCOUNTER — Ambulatory Visit (INDEPENDENT_AMBULATORY_CARE_PROVIDER_SITE_OTHER): Payer: Medicare HMO

## 2023-06-17 DIAGNOSIS — I428 Other cardiomyopathies: Secondary | ICD-10-CM

## 2023-06-17 LAB — CUP PACEART REMOTE DEVICE CHECK
Battery Remaining Longevity: 4 mo
Battery Voltage: 2.8 V
Brady Statistic AP VP Percent: 20.81 %
Brady Statistic AP VS Percent: 0.37 %
Brady Statistic AS VP Percent: 77.73 %
Brady Statistic AS VS Percent: 1.1 %
Brady Statistic RA Percent Paced: 21.15 %
Brady Statistic RV Percent Paced: 87.51 %
Date Time Interrogation Session: 20240812002203
HighPow Impedance: 69 Ohm
Implantable Lead Connection Status: 753985
Implantable Lead Connection Status: 753985
Implantable Lead Connection Status: 753985
Implantable Lead Implant Date: 20160610
Implantable Lead Implant Date: 20160610
Implantable Lead Implant Date: 20160610
Implantable Lead Location: 753858
Implantable Lead Location: 753859
Implantable Lead Location: 753860
Implantable Lead Model: 4396
Implantable Lead Model: 5076
Implantable Pulse Generator Implant Date: 20160610
Lead Channel Impedance Value: 361 Ohm
Lead Channel Impedance Value: 399 Ohm
Lead Channel Impedance Value: 399 Ohm
Lead Channel Impedance Value: 551 Ohm
Lead Channel Impedance Value: 589 Ohm
Lead Channel Impedance Value: 703 Ohm
Lead Channel Pacing Threshold Amplitude: 0.375 V
Lead Channel Pacing Threshold Amplitude: 1.625 V
Lead Channel Pacing Threshold Amplitude: 1.75 V
Lead Channel Pacing Threshold Pulse Width: 0.4 ms
Lead Channel Pacing Threshold Pulse Width: 0.4 ms
Lead Channel Pacing Threshold Pulse Width: 0.4 ms
Lead Channel Sensing Intrinsic Amplitude: 1.25 mV
Lead Channel Sensing Intrinsic Amplitude: 1.25 mV
Lead Channel Sensing Intrinsic Amplitude: 14.75 mV
Lead Channel Sensing Intrinsic Amplitude: 14.75 mV
Lead Channel Setting Pacing Amplitude: 2 V
Lead Channel Setting Pacing Amplitude: 2.75 V
Lead Channel Setting Pacing Amplitude: 3.5 V
Lead Channel Setting Pacing Pulse Width: 0.4 ms
Lead Channel Setting Pacing Pulse Width: 0.4 ms
Lead Channel Setting Sensing Sensitivity: 0.3 mV
Zone Setting Status: 755011

## 2023-06-20 DIAGNOSIS — H5203 Hypermetropia, bilateral: Secondary | ICD-10-CM | POA: Diagnosis not present

## 2023-06-26 DIAGNOSIS — I739 Peripheral vascular disease, unspecified: Secondary | ICD-10-CM | POA: Diagnosis not present

## 2023-06-26 DIAGNOSIS — M792 Neuralgia and neuritis, unspecified: Secondary | ICD-10-CM | POA: Diagnosis not present

## 2023-06-26 DIAGNOSIS — G629 Polyneuropathy, unspecified: Secondary | ICD-10-CM | POA: Diagnosis not present

## 2023-06-26 DIAGNOSIS — M7742 Metatarsalgia, left foot: Secondary | ICD-10-CM | POA: Diagnosis not present

## 2023-06-26 DIAGNOSIS — M7741 Metatarsalgia, right foot: Secondary | ICD-10-CM | POA: Diagnosis not present

## 2023-07-01 NOTE — Progress Notes (Signed)
Remote ICD transmission.   

## 2023-07-09 ENCOUNTER — Other Ambulatory Visit (HOSPITAL_COMMUNITY): Payer: Self-pay | Admitting: Cardiology

## 2023-07-09 DIAGNOSIS — I5022 Chronic systolic (congestive) heart failure: Secondary | ICD-10-CM

## 2023-07-17 ENCOUNTER — Telehealth: Payer: Self-pay

## 2023-07-17 ENCOUNTER — Ambulatory Visit (INDEPENDENT_AMBULATORY_CARE_PROVIDER_SITE_OTHER): Payer: Medicare HMO

## 2023-07-17 DIAGNOSIS — I428 Other cardiomyopathies: Secondary | ICD-10-CM

## 2023-07-17 LAB — CUP PACEART REMOTE DEVICE CHECK
Battery Remaining Longevity: 4 mo
Battery Voltage: 2.78 V
Brady Statistic AP VP Percent: 14.77 %
Brady Statistic AP VS Percent: 0.27 %
Brady Statistic AS VP Percent: 83.77 %
Brady Statistic AS VS Percent: 1.19 %
Brady Statistic RA Percent Paced: 15.01 %
Brady Statistic RV Percent Paced: 89.04 %
Date Time Interrogation Session: 20240911043723
HighPow Impedance: 68 Ohm
Implantable Lead Connection Status: 753985
Implantable Lead Connection Status: 753985
Implantable Lead Connection Status: 753985
Implantable Lead Implant Date: 20160610
Implantable Lead Implant Date: 20160610
Implantable Lead Implant Date: 20160610
Implantable Lead Location: 753858
Implantable Lead Location: 753859
Implantable Lead Location: 753860
Implantable Lead Model: 4396
Implantable Lead Model: 5076
Implantable Pulse Generator Implant Date: 20160610
Lead Channel Impedance Value: 342 Ohm
Lead Channel Impedance Value: 361 Ohm
Lead Channel Impedance Value: 399 Ohm
Lead Channel Impedance Value: 513 Ohm
Lead Channel Impedance Value: 551 Ohm
Lead Channel Impedance Value: 589 Ohm
Lead Channel Pacing Threshold Amplitude: 0.375 V
Lead Channel Pacing Threshold Amplitude: 1.5 V
Lead Channel Pacing Threshold Amplitude: 2.375 V
Lead Channel Pacing Threshold Pulse Width: 0.4 ms
Lead Channel Pacing Threshold Pulse Width: 0.4 ms
Lead Channel Pacing Threshold Pulse Width: 0.4 ms
Lead Channel Sensing Intrinsic Amplitude: 1.125 mV
Lead Channel Sensing Intrinsic Amplitude: 1.125 mV
Lead Channel Sensing Intrinsic Amplitude: 12.375 mV
Lead Channel Sensing Intrinsic Amplitude: 12.375 mV
Lead Channel Setting Pacing Amplitude: 2 V
Lead Channel Setting Pacing Amplitude: 2.5 V
Lead Channel Setting Pacing Amplitude: 5 V
Lead Channel Setting Pacing Pulse Width: 0.4 ms
Lead Channel Setting Pacing Pulse Width: 0.4 ms
Lead Channel Setting Sensing Sensitivity: 0.3 mV
Zone Setting Status: 755011

## 2023-07-17 NOTE — Telephone Encounter (Signed)
Monthly battery check.  Estimated 34mo Increase in RA threshold, last measured 2.375V @ 0.9ms, previous trend 1.25-1.5V.  Route to triage No new alerts. Follow up as scheduled monthly LA, CVRS   Patient has not been seen since 2020.  Needs to get in and re-establish.   Patient needs RA lead evaluated as it is in high output.  Trend has been elevated since end of August.  Appointment made with Dr. Ladona Ridgel for this Friday 9/13 at 815am.

## 2023-07-19 ENCOUNTER — Ambulatory Visit: Payer: Medicare HMO | Attending: Internal Medicine | Admitting: Internal Medicine

## 2023-07-19 ENCOUNTER — Encounter: Payer: Self-pay | Admitting: Internal Medicine

## 2023-07-19 VITALS — BP 170/90 | HR 60 | Ht 62.0 in | Wt 185.8 lb

## 2023-07-19 DIAGNOSIS — I5022 Chronic systolic (congestive) heart failure: Secondary | ICD-10-CM | POA: Diagnosis not present

## 2023-07-19 LAB — CUP PACEART INCLINIC DEVICE CHECK
Date Time Interrogation Session: 20240913164648
Implantable Lead Connection Status: 753985
Implantable Lead Connection Status: 753985
Implantable Lead Connection Status: 753985
Implantable Lead Implant Date: 20160610
Implantable Lead Implant Date: 20160610
Implantable Lead Implant Date: 20160610
Implantable Lead Location: 753858
Implantable Lead Location: 753859
Implantable Lead Location: 753860
Implantable Lead Model: 4396
Implantable Lead Model: 5076
Implantable Pulse Generator Implant Date: 20160610

## 2023-07-19 NOTE — Patient Instructions (Addendum)
Medication Instructions:  Your physician recommends that you continue on your current medications as directed. Please refer to the Current Medication list given to you today.  *If you need a refill on your cardiac medications before your next appointment, please call your pharmacy*  Lab Work: None ordered.  If you have labs (blood work) drawn today and your tests are completely normal, you will receive your results only by: MyChart Message (if you have MyChart) OR A paper copy in the mail If you have any lab test that is abnormal or we need to change your treatment, we will call you to review the results.  Testing/Procedures: None ordered.  Follow-Up: At [facility], you and your health needs are our priority.  As part of our continuing mission to provide you with exceptional heart care, we have created designated Provider Care Teams.  These Care Teams include your primary Cardiologist (physician) and Advanced Practice Providers (APPs -  Physician Assistants and Nurse Practitioners) who all work together to provide you with the care you need, when you need it.  Your next appointment:   April 2025 follow up  The format for your next appointment:   In Person  Provider:   Lewayne Bunting, MD{    Important Information About Sugar

## 2023-07-19 NOTE — Progress Notes (Signed)
HPI Stacy Hernandez returns today after a 4 year absence from our EP clinic. She is a pleasant 67 yo woman with chronic systolic heart failure and LBBB who underwent biv ICD insertion 8 years ago. She is approaching ERI. She has had normalization of her LV function. No ICD therapies. She denies syncope. Her weight has not changed.  Allergies  Allergen Reactions   Prednisone Nausea Only     Current Outpatient Medications  Medication Sig Dispense Refill   albuterol (VENTOLIN HFA) 108 (90 Base) MCG/ACT inhaler Inhale 2 puffs into the lungs every 4 (four) hours as needed.     aspirin 81 MG tablet Take 81 mg by mouth at bedtime.      atorvastatin (LIPITOR) 10 MG tablet TAKE 1 TABLET BY MOUTH EVERYDAY AT BEDTIME 90 tablet 3   carvedilol (COREG) 12.5 MG tablet Take 1 tablet (12.5 mg total) by mouth 2 (two) times daily. 180 tablet 3   cholecalciferol (VITAMIN D) 400 units TABS tablet Take 400 Units by mouth daily.     ENTRESTO 97-103 MG TAKE 1 TABLET BY MOUTH TWICE A DAY 180 tablet 3   gabapentin (NEURONTIN) 300 MG capsule Take 600 mg by mouth daily.     levothyroxine (SYNTHROID, LEVOTHROID) 100 MCG tablet Take 100 mcg by mouth daily before breakfast.     Multiple Vitamins-Minerals (MULTIVITAMIN WITH MINERALS) tablet Take 1 tablet by mouth daily.     niacin 500 MG tablet Take 1,500 mg by mouth at bedtime.     nitroGLYCERIN (NITROSTAT) 0.4 MG SL tablet Place 1 tablet (0.4 mg total) under the tongue every 5 (five) minutes as needed for chest pain. 25 tablet 3   Phenylephrine-APAP-guaiFENesin (MUCINEX SINUS-MAX PO) Take 1 tablet by mouth daily as needed (sinuses).     spironolactone (ALDACTONE) 25 MG tablet TAKE 1 TABLET BY MOUTH EVERY DAY 90 tablet 3   traMADol (ULTRAM) 50 MG tablet Take 50 mg by mouth every evening.     No current facility-administered medications for this visit.     Past Medical History:  Diagnosis Date   AICD (automatic cardioverter/defibrillator) present 04/15/2015    Medtronic (serial  Number [id number]) biventricular ICD   Back pain    Recent positive ANA but told she has OA   CAD (coronary artery disease)    a.  per cath in June 2013 with nonobstructive CAD;  b.  Lexiscan Myoview (12/15):  No ischemia.  Anteroseptal and apical fixed defect - 2/2 IVCD of LBBB type vs scar, EF 25%; Intermediate Risk   Cardiomyopathy (HCC)    a.  EF 45-50% by echo 04/23/2012;  b.  Echo (12/15):  EF 10-15%, Gr 2 DD, ventricular septum dyssynergy, mod MR, mod LAE.   CHF (congestive heart failure) (HCC)    COPD (chronic obstructive pulmonary disease) (HCC)    Hypercalcemia    Due to HCTZ, improved with d/c of calcium supplements   Hypertension    Hypertriglyceridemia    Nephrolithiasis    OA (osteoarthritis)    Osteoporosis    Sleep apnea    Thyroid disease    Post-ablation hypothyroidism   Tobacco abuse     ROS:   All systems reviewed and negative except as noted in the HPI.   Past Surgical History:  Procedure Laterality Date   BIOPSY  08/08/2018   Procedure: BIOPSY;  Surgeon: Jeani Hawking, MD;  Location: [facility] ENDOSCOPY;  Service: Endoscopy;;   BLADDER SURGERY     bladder & bowel  tac with mesh   CARPAL TUNNEL RELEASE     CESAREAN SECTION     EP IMPLANTABLE DEVICE N/A 04/15/2015   Procedure: BiV ICD Insertion CRT-D;  Surgeon: Marinus Maw, MD;  Medtronic (serial  Number [id number]) biventricular ICD   ESOPHAGOGASTRODUODENOSCOPY (EGD) WITH PROPOFOL N/A 08/08/2018   Procedure: ESOPHAGOGASTRODUODENOSCOPY (EGD) WITH PROPOFOL;  Surgeon: Jeani Hawking, MD;  Location: [facility] ENDOSCOPY;  Service: Endoscopy;  Laterality: N/A;   LEFT AND RIGHT HEART CATHETERIZATION WITH CORONARY ANGIOGRAM N/A 11/12/2014   Procedure: LEFT AND RIGHT HEART CATHETERIZATION WITH CORONARY ANGIOGRAM;  Surgeon: Laurey Morale, MD;  Location: [facility] CATH LAB;  Service: Cardiovascular;  Laterality: N/A;   LEFT HEART CATHETERIZATION WITH CORONARY ANGIOGRAM N/A 04/22/2012   Procedure: LEFT HEART  CATHETERIZATION WITH CORONARY ANGIOGRAM;  Surgeon: Herby Abraham, MD;  Location: [facility] CATH LAB;  Service: Cardiovascular;  Laterality: N/A;   PARTIAL HYSTERECTOMY     Due to cervical dysplasia   SAVORY DILATION N/A 08/08/2018   Procedure: SAVORY DILATION;  Surgeon: Jeani Hawking, MD;  Location: [facility] ENDOSCOPY;  Service: Endoscopy;  Laterality: N/A;   TONSILLECTOMY       Family History  Problem Relation Age of Onset   Coronary artery disease Father        s/p CABG in his 35s   Hypertension Father    Heart attack Father    COPD Mother        smoked   Colon polyps Mother    Colon cancer Maternal Uncle 73   Diabetes Sister    Cirrhosis Sister    Stroke Neg Hx      Social History   Socioeconomic History   Marital status: Married    Spouse name: Not on file   Number of children: Not on file   Years of education: Not on file   Highest education level: Not on file  Occupational History   Not on file  Tobacco Use   Smoking status: Former    Current packs/day: 0.00    Average packs/day: 2.0 packs/day for 35.0 years (70.0 ttl pk-yrs)    Types: Cigarettes    Start date: 11/06/1979    Quit date: 11/05/2014    Years since quitting: 8.7   Smokeless tobacco: Never  Substance and Sexual Activity   Alcohol use: No    Alcohol/week: 0.0 standard drinks of alcohol   Drug use: No   Sexual activity: Not Currently  Other Topics Concern   Not on file  Social History Narrative   Not on file   Social Determinants of Health   Financial Resource Strain: Not on file  Food Insecurity: Not on file  Transportation Needs: Not on file  Physical Activity: Not on file  Stress: Not on file  Social Connections: Unknown (03/20/2022)   [facility] from [facility]   Social Network    Social Network: Not on file  Intimate Partner Violence: Unknown (02/09/2022)   Received from [facility]   HITS    Physically Hurt: Not on file    Insult or Talk Down To: Not on file    Threaten Physical Harm: Not  on file    Scream or Curse: Not on file     BP (!) 170/90   Pulse 60   Ht 5\' 2"  (1.575 m)   Wt 185 lb 12.8 oz (84.3 kg)   SpO2 94%   BMI 33.98 kg/m   Physical Exam:  Well appearing NAD HEENT: Unremarkable Neck:  No JVD, no thyromegally  Lymphatics:  No adenopathy Back:  No CVA tenderness Lungs:  Clear with no wheezes HEART:  Regular rate rhythm, no murmurs, no rubs, no clicks Abd:  soft, positive bowel sounds, no organomegally, no rebound, no guarding Ext:  2 plus pulses, no edema, no cyanosis, no clubbing Skin:  No rashes no nodules Neuro:  CN II through XII intact, motor grossly intact  EKG - nsr with biv pacing  DEVICE  Normal device function.  See PaceArt for details. 4 months to ERI.  Assess/Plan: Chronic systolic heart failure - she has had normalization of her LV function and she will continue her GDMT. ICD - she is approaching ERI. I plan to switch to a biv ppm if her EF remains elevated.  Atrial threshold - her atrial threshold is chronically elevated. She paces 15%. We will reprogram the device. No plans for extraction at this time.  Obesity - she is encouraged to lose weight.   Sharlot Gowda Mariavictoria Nottingham,MD

## 2023-07-22 ENCOUNTER — Other Ambulatory Visit (HOSPITAL_COMMUNITY): Payer: Self-pay | Admitting: Cardiology

## 2023-07-31 NOTE — Progress Notes (Signed)
Remote ICD transmission.   

## 2023-08-01 DIAGNOSIS — E213 Hyperparathyroidism, unspecified: Secondary | ICD-10-CM | POA: Diagnosis not present

## 2023-08-01 DIAGNOSIS — E559 Vitamin D deficiency, unspecified: Secondary | ICD-10-CM | POA: Diagnosis not present

## 2023-08-01 DIAGNOSIS — E039 Hypothyroidism, unspecified: Secondary | ICD-10-CM | POA: Diagnosis not present

## 2023-08-06 LAB — LAB REPORT - SCANNED: EGFR: 68

## 2023-08-13 DIAGNOSIS — I509 Heart failure, unspecified: Secondary | ICD-10-CM | POA: Diagnosis not present

## 2023-08-13 DIAGNOSIS — E559 Vitamin D deficiency, unspecified: Secondary | ICD-10-CM | POA: Diagnosis not present

## 2023-08-13 DIAGNOSIS — Z0000 Encounter for general adult medical examination without abnormal findings: Secondary | ICD-10-CM | POA: Diagnosis not present

## 2023-08-13 DIAGNOSIS — I10 Essential (primary) hypertension: Secondary | ICD-10-CM | POA: Diagnosis not present

## 2023-08-13 DIAGNOSIS — E890 Postprocedural hypothyroidism: Secondary | ICD-10-CM | POA: Diagnosis not present

## 2023-08-13 DIAGNOSIS — E7800 Pure hypercholesterolemia, unspecified: Secondary | ICD-10-CM | POA: Diagnosis not present

## 2023-08-13 DIAGNOSIS — E213 Hyperparathyroidism, unspecified: Secondary | ICD-10-CM | POA: Diagnosis not present

## 2023-08-13 DIAGNOSIS — I428 Other cardiomyopathies: Secondary | ICD-10-CM | POA: Diagnosis not present

## 2023-08-14 ENCOUNTER — Encounter: Payer: Self-pay | Admitting: Registered Nurse

## 2023-08-19 ENCOUNTER — Ambulatory Visit (INDEPENDENT_AMBULATORY_CARE_PROVIDER_SITE_OTHER): Payer: Medicare HMO

## 2023-08-19 DIAGNOSIS — I5022 Chronic systolic (congestive) heart failure: Secondary | ICD-10-CM | POA: Diagnosis not present

## 2023-08-20 ENCOUNTER — Telehealth: Payer: Self-pay | Admitting: Internal Medicine

## 2023-08-20 ENCOUNTER — Telehealth (HOSPITAL_COMMUNITY): Payer: Self-pay

## 2023-08-20 ENCOUNTER — Encounter: Payer: Medicare HMO | Admitting: Internal Medicine

## 2023-08-20 LAB — CUP PACEART REMOTE DEVICE CHECK
Battery Remaining Longevity: 2 mo
Battery Voltage: 2.75 V
Brady Statistic AP VP Percent: 15.99 %
Brady Statistic AP VS Percent: 0.27 %
Brady Statistic AS VP Percent: 82.55 %
Brady Statistic AS VS Percent: 1.19 %
Brady Statistic RA Percent Paced: 16.21 %
Brady Statistic RV Percent Paced: 92.64 %
Date Time Interrogation Session: 20241015183323
HighPow Impedance: 66 Ohm
Implantable Lead Connection Status: 753985
Implantable Lead Connection Status: 753985
Implantable Lead Connection Status: 753985
Implantable Lead Implant Date: 20160610
Implantable Lead Implant Date: 20160610
Implantable Lead Implant Date: 20160610
Implantable Lead Location: 753858
Implantable Lead Location: 753859
Implantable Lead Location: 753860
Implantable Lead Model: 4396
Implantable Lead Model: 5076
Implantable Pulse Generator Implant Date: 20160610
Lead Channel Impedance Value: 399 Ohm
Lead Channel Impedance Value: 399 Ohm
Lead Channel Impedance Value: 418 Ohm
Lead Channel Impedance Value: 475 Ohm
Lead Channel Impedance Value: 532 Ohm
Lead Channel Impedance Value: 703 Ohm
Lead Channel Pacing Threshold Amplitude: 0.5 V
Lead Channel Pacing Threshold Amplitude: 1.375 V
Lead Channel Pacing Threshold Amplitude: 2.125 V
Lead Channel Pacing Threshold Pulse Width: 0.4 ms
Lead Channel Pacing Threshold Pulse Width: 0.4 ms
Lead Channel Pacing Threshold Pulse Width: 0.4 ms
Lead Channel Sensing Intrinsic Amplitude: 0.875 mV
Lead Channel Sensing Intrinsic Amplitude: 0.875 mV
Lead Channel Sensing Intrinsic Amplitude: 13.875 mV
Lead Channel Sensing Intrinsic Amplitude: 13.875 mV
Lead Channel Setting Pacing Amplitude: 2 V
Lead Channel Setting Pacing Amplitude: 2.5 V
Lead Channel Setting Pacing Amplitude: 2.5 V
Lead Channel Setting Pacing Pulse Width: 0.4 ms
Lead Channel Setting Pacing Pulse Width: 0.4 ms
Lead Channel Setting Sensing Sensitivity: 0.3 mV
Zone Setting Status: 755011

## 2023-08-20 NOTE — Telephone Encounter (Signed)
Pt states she is having to get a new battery for her pacemaker. Please advise

## 2023-08-20 NOTE — Telephone Encounter (Signed)
Advanced Heart Failure Patient Advocate Encounter  The patient was approved for a [employer] grant that will help cover the cost of Carvedilol, Entresto, Spironolactone.  Total amount awarded, $10,000.  Effective: 08/21/2023 - 08/19/2024.  BIN [id number] PCN [id number] [id number] ID [id number]  Pharmacy provided with approval and processing information. Patient informed via phone.  Burnell Blanks, CPhT Rx Patient Advocate Phone: [phone]

## 2023-08-23 NOTE — Telephone Encounter (Signed)
I spoke with the patient and Medtronic is sending her a new handheld for her monitor.

## 2023-09-02 NOTE — Progress Notes (Signed)
Remote ICD transmission.   

## 2023-09-11 ENCOUNTER — Other Ambulatory Visit (HOSPITAL_COMMUNITY): Payer: Self-pay | Admitting: Cardiology

## 2023-09-16 DIAGNOSIS — M859 Disorder of bone density and structure, unspecified: Secondary | ICD-10-CM | POA: Diagnosis not present

## 2023-09-16 DIAGNOSIS — E213 Hyperparathyroidism, unspecified: Secondary | ICD-10-CM | POA: Diagnosis not present

## 2023-09-16 DIAGNOSIS — I10 Essential (primary) hypertension: Secondary | ICD-10-CM | POA: Diagnosis not present

## 2023-09-16 DIAGNOSIS — E890 Postprocedural hypothyroidism: Secondary | ICD-10-CM | POA: Diagnosis not present

## 2023-09-18 ENCOUNTER — Ambulatory Visit (HOSPITAL_COMMUNITY)
Admission: RE | Admit: 2023-09-18 | Discharge: 2023-09-18 | Disposition: A | Discharge status: Home / Self Care | Payer: Medicare HMO | Source: Ambulatory Visit | Attending: Cardiology | Admitting: Cardiology

## 2023-09-18 ENCOUNTER — Ambulatory Visit (HOSPITAL_BASED_OUTPATIENT_CLINIC_OR_DEPARTMENT_OTHER)
Admission: RE | Admit: 2023-09-18 | Discharge: 2023-09-18 | Disposition: A | Discharge status: Home / Self Care | Payer: Medicare HMO | Source: Ambulatory Visit | Attending: Cardiology | Admitting: Cardiology

## 2023-09-18 ENCOUNTER — Encounter (HOSPITAL_COMMUNITY): Payer: Self-pay | Admitting: Cardiology

## 2023-09-18 ENCOUNTER — Other Ambulatory Visit (HOSPITAL_COMMUNITY): Payer: Self-pay

## 2023-09-18 VITALS — BP 140/80 | HR 60 | Wt 179.8 lb

## 2023-09-18 DIAGNOSIS — I2510 Atherosclerotic heart disease of native coronary artery without angina pectoris: Secondary | ICD-10-CM | POA: Diagnosis not present

## 2023-09-18 DIAGNOSIS — J449 Chronic obstructive pulmonary disease, unspecified: Secondary | ICD-10-CM | POA: Diagnosis not present

## 2023-09-18 DIAGNOSIS — I5022 Chronic systolic (congestive) heart failure: Secondary | ICD-10-CM

## 2023-09-18 DIAGNOSIS — R0789 Other chest pain: Secondary | ICD-10-CM | POA: Diagnosis not present

## 2023-09-18 DIAGNOSIS — I25119 Atherosclerotic heart disease of native coronary artery with unspecified angina pectoris: Secondary | ICD-10-CM

## 2023-09-18 DIAGNOSIS — G4733 Obstructive sleep apnea (adult) (pediatric): Secondary | ICD-10-CM | POA: Insufficient documentation

## 2023-09-18 DIAGNOSIS — I110 Hypertensive heart disease with heart failure: Secondary | ICD-10-CM | POA: Diagnosis not present

## 2023-09-18 DIAGNOSIS — Z79899 Other long term (current) drug therapy: Secondary | ICD-10-CM | POA: Insufficient documentation

## 2023-09-18 DIAGNOSIS — Z87891 Personal history of nicotine dependence: Secondary | ICD-10-CM | POA: Diagnosis not present

## 2023-09-18 DIAGNOSIS — I428 Other cardiomyopathies: Secondary | ICD-10-CM | POA: Diagnosis not present

## 2023-09-18 DIAGNOSIS — Z6832 Body mass index (BMI) 32.0-32.9, adult: Secondary | ICD-10-CM | POA: Diagnosis not present

## 2023-09-18 DIAGNOSIS — E785 Hyperlipidemia, unspecified: Secondary | ICD-10-CM | POA: Insufficient documentation

## 2023-09-18 DIAGNOSIS — E669 Obesity, unspecified: Secondary | ICD-10-CM | POA: Insufficient documentation

## 2023-09-18 DIAGNOSIS — Z7984 Long term (current) use of oral hypoglycemic drugs: Secondary | ICD-10-CM | POA: Diagnosis not present

## 2023-09-18 DIAGNOSIS — I252 Old myocardial infarction: Secondary | ICD-10-CM | POA: Diagnosis not present

## 2023-09-18 DIAGNOSIS — I447 Left bundle-branch block, unspecified: Secondary | ICD-10-CM | POA: Insufficient documentation

## 2023-09-18 MED ORDER — NITROGLYCERIN 0.4 MG SL SUBL: 0.4000 mg | SUBLINGUAL_TABLET | SUBLINGUAL | 3 refills | Status: AC | PRN

## 2023-09-18 MED ORDER — FARXIGA 10 MG PO TABS: 10.0000 mg | ORAL_TABLET | Freq: Every day | ORAL | 3 refills | Status: DC

## 2023-09-18 NOTE — Progress Notes (Signed)
Date:  09/18/2023   ID:  Stacy Hernandez, DOB Mar 06, 1956, MRN [id number]   Provider location: [facility] Type of Visit: Established patient  PCP:  Merri Brunette, MD  Cardiologist: Dr. Shirlee Latch   History of Present Illness: Stacy Hernandez is a 67 y.o. female who has a history of HTN, intermittent LBBB, and nonischemic cardiomyopathy.  She was admitted with chest pain in 6/13.  ECG showed intermittent LBBB.  Echo showed EF 45-50%.  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     Developed CP and dyspnea. She was set up for echo and Cardiolite in 12/15.  Echo showed EF 10-15% (significant fall).  She had a Lexiscan Cardiolite with EF 25% and apical anteroseptal and apical fixed perfusion defect.  Therefore, she was sent for left and right heart catheterization in 1/16.  This showed mild elevation in LV and RV filling pressures and no significant CAD.  She was started on Lasix 20 mg daily.  Echo in 5/16 showed EF 20-25%, mildly dilated and mildly dysfunctional RV.  PFTs 4/16 showed severe obstruction and restriction.  She has seen Dr Sherene Sires.  She is noted to have an elevated right hemidiaphragm.    In 6/16, she had Medtronic CRT-D system placed.  In 7/16, CPX showed low normal functional capacity with the main limitation being pulmonary.  Repeat echo in 11/16 showed improvement in EF to 40-45%.  Echo in 7/18 showed stable EF 40-45%.  Echo in 7/19 showed EF up to 50-55%.  Echo in 10/20 showed EF 50-55% with mildly decreased RV systolic function.  Echo in 6/22 showed EF 56%, normal RV.   Echo was done today and reviewed, EF appears to be about 50% with mild RV dysfunction, mild MR, normal IVC (no official read yet).    She returns today for followup of CHF.  Unfortunately, her husband passed away about 2 weeks ago after a long illness.  She is doing reasonably well, no exertional dyspnea or chest pain.  No orthopnea/PND. Her device is nearing ERI and will need  generator change. Weight is down 8 lbs.    ECG (personally reviewed): NSR, BiV paced   Labs (12/13): LDL 80, HDL 46, K 4, creatinine 0.7 Labs (6/14): K 4, creatinine 0.8, LDL 73, HDL 45 Labs (12/15): K 4.2, creatinine 0.7, HCT 43.3, LDL 60, HDL 51, BNP 577, TSH normal Labs (2/16): K 4.2, creatinine 0.7 Labs (1/16): SPEP no M spike K 4.3 Creatinine 0.74  Labs (2/16): K 4.2, creatinine 0.78 Labs (02/23/2015) : K 4.2 Creatinine 0.90, BNP 339 Labs (04/13/2015): K 4.5 Creatinine 1.01  Labs (05/17/2015): K 4.7 Creatinine 0.71  Labs (11/16): K 4.1, creatinine 0.74 Labs (6/17): K 4.2, creatinine 0.85, BNP 76 Labs (1/18): LDL 55, HDL 42 Labs (7/18): K 3.9, creatinine 0.91 Labs (1/19): K 4, creatinine 1.0, LDL 59, hgb 13.7 Labs (10/19): LDL 70 Labs (1/20): K 4.2, creatinine 0.96 Labs (7/20): K 4, creatinine 1.1, LDL 76, hgb 13.2 Labs (8/21): K 5.5, creatinine 1.1, LDL 73, hgb 12.7 Labs (2/22): LDL 73, K 4.4, creatinine 0.9 Labs (4/22): K 4, creatinine 0.9 Labs (9/23): creatinine 0.79 Labs (10/24): K 3.9, creatinine 0.93, LDL 67, hgb 13.1   PMH: 1. HTN 2. Hyperlipidemia 3. COPD: Prior smoker.  PFTs (4/16) with FVC 37%, FEV1 36%, ratio 97%, TLC 67%, DLCO 32% => severe obstruction and restriction.  COPD + elevated right hemidiaphragm.  4. Intermittent LBBB/IVCD 5. Hypothyroidism 6. Cardiomyopathy: Nonischemic.  Echo (  6/13) with EF 45-50%, paradoxical septal motion.  LHC (6/13) with 40% D1 stenosis, 30% PLV stenosis.  Echo (12/15) with EF 10-15%, moderately dilated LV, diffuse hypokinesis, moderate MR. Lexiscan Cardiolite (12/15) with EF 25%, diffuse hypokinesis, apical anteroseptal and apical fixed defect. LHC/RHC (1/16) with mean RA 8, PA 37/19, mean PCWP 19, CI 2.63, EF 30%, no significant CAD.  Cardiac MRI 12/20/2014: EF 24% Mod mitral regurg thought to be from dilated LV, normal RV size, no LGE.  Echo (5/16) with EF 20-25%, mildly dilated and mildly dysfunctional RV, mild MR. Medtronic CRT-D  placed 6/16.  CPX (7/16) with peak VO2 16.3, VE/VCO2 slope 25, RER 1.1, low normal functional capacity limited primarily by lung disease.  - Echo (11/16) with EF 40-45%, mild MR.  - Echo (7/18): EF 40-45%, diffuse hypokinesis, grade II diastolic dysfunction.  - Echo (7/19): EF 50-55%, mild MR, PASP 37 mmHg.  - Echo (10/20): EF 50-55%, septal bounce, mildly decreased RV systolic function, PASP 26 mmHg.  - Echo (6/22): EF 56%, normal RV.  - Echo (11/24): EF 50%, mild RV dysfunction, mild MR, IVC normal 7. 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  8. OSA: 04/18/2015 Sleep Study. She has not been able to tolerate CPAP. 9. ABIs (12/18): Normal  Current Outpatient Medications  Medication Sig Dispense Refill   albuterol (VENTOLIN HFA) 108 (90 Base) MCG/ACT inhaler Inhale 2 puffs into the lungs every 4 (four) hours as needed.     aspirin 81 MG tablet Take 81 mg by mouth at bedtime.      atorvastatin (LIPITOR) 10 MG tablet TAKE 1 TABLET BY MOUTH EVERYDAY AT BEDTIME 90 tablet 3   carvedilol (COREG) 12.5 MG tablet TAKE 1 TABLET BY MOUTH 2 TIMES DAILY. 180 tablet 3   cholecalciferol (VITAMIN D) 400 units TABS tablet Take 400 Units by mouth daily.     FARXIGA 10 MG TABS tablet Take 1 tablet (10 mg total) by mouth daily before breakfast. 90 tablet 3   gabapentin (NEURONTIN) 300 MG capsule Take 300 mg by mouth daily.     levothyroxine (SYNTHROID, LEVOTHROID) 100 MCG tablet Take 100 mcg by mouth daily before breakfast.     Multiple Vitamins-Minerals (MULTIVITAMIN WITH MINERALS) tablet Take 1 tablet by mouth daily.     niacin 500 MG tablet Take 1,500 mg by mouth at bedtime.     Phenylephrine-APAP-guaiFENesin (MUCINEX SINUS-MAX PO) Take 1 tablet by mouth daily as needed (sinuses).     sacubitril-valsartan (ENTRESTO) 97-103 MG Take 1 tablet by mouth 2 (two) times daily. 180 tablet 3   spironolactone (ALDACTONE) 25 MG tablet TAKE 1 TABLET BY MOUTH EVERY DAY 90 tablet 3   traMADol  (ULTRAM) 50 MG tablet Take 50 mg by mouth every evening.     nitroGLYCERIN (NITROSTAT) 0.4 MG SL tablet Place 1 tablet (0.4 mg total) under the tongue every 5 (five) minutes as needed for chest pain. 25 tablet 3   No current facility-administered medications for this encounter.    Allergies:   Prednisone   Social History:  The patient  reports that she quit smoking about 8 years ago. Her smoking use included cigarettes. She started smoking about 43 years ago. She has a 70 pack-year smoking history. She has never used smokeless tobacco. She reports that she does not drink alcohol and does not use drugs.   Family History:  The patient's family history includes COPD in her mother; Cirrhosis in her sister; Colon cancer (age of onset: 56) in her  maternal uncle; Colon polyps in her mother; Coronary artery disease in her father; Diabetes in her sister; Heart attack in her father; Hypertension in her father.   ROS:  Please see the history of present illness.   All other systems are personally reviewed and negative.   Exam:   BP (!) 140/80   Pulse 60   Wt 81.6 kg (179 lb 12.8 oz)   SpO2 95%   BMI 32.89 kg/m   General: NAD Neck: No JVD, no thyromegaly or thyroid nodule.  Lungs: Clear to auscultation bilaterally with normal respiratory effort. CV: Nondisplaced PMI.  Heart regular S1/S2, no S3/S4, no murmur.  No peripheral edema.  No carotid bruit.  Normal pedal pulses.  Abdomen: Soft, nontender, no hepatosplenomegaly, no distention.  Skin: Intact without lesions or rashes.  Neurologic: Alert and oriented x 3.  Psych: Normal affect. Extremities: No clubbing or cyanosis.  HEENT: Normal.   Recent Labs: 12/13/2022: BUN 14; Creatinine, Ser 0.81; Potassium 4.1; Sodium 138  Personally reviewed   Wt Readings from Last 3 Encounters:  09/18/23 81.6 kg (179 lb 12.8 oz)  07/19/23 84.3 kg (185 lb 12.8 oz)  09/14/22 85 kg (187 lb 6.4 oz)      ASSESSMENT AND PLAN:  1. Cardiomyopathy: Nonischemic  cardiomyopathy. In the past, she had EF 45-50% with no significant CAD (2013).  LBBB-related nonischemic cardiomyopathy.  However, in the fall of 2015 she developed exertional chest pain and dyspnea.  Echo and Cardiolite showed marked worsening of her LV systolic function (EF 10-15% by echo) and Cardiolite suggested possible prior MI.  Left heart cath in 1/16 showed no significant CAD.  RHC showed mildly elevated right and left heart filling pressures and normal CI. SPEP negative and no infiltrative disease on cardiac MRI. Repeat echo in 5/16 with EF 20-25%.  CPX showed limitations are related to lung disease as well as obesity, no HF limitation. Medtronic CRT-D placement.  Followup echo in 11/16 showed improvement in EF to 40-45%.  Echo in 10/20 showed EF 50-55%.  Echo in 6/22 with EF up to 56%. Echo today on my read showed 50% with mild RV dysfunction, mild MR, normal IVC.   She is not volume overloaded on exam, NYHA class II symptoms.   - Continue Coreg 12.5 mg bid.   - Continue Entresto to 97/103 bid.   - Continue spironolactone 25 mg daily.   - I will start Farxiga 10 mg daily given mildly decreased systolic function. BMET/BNP in 10 days.  - She will need BMET every 3 months to follow K and creatinine given her medication regimen.   2. Elevated right hemidiaphragm: Chronic issue.  3. OSA:  Has not been able to tolerate CPAP.    4. COPD: Severe by PFTs. No longer smokes.  5. Hyperlipidemia: Continue atorvastatin.    Recommended follow-up: 6 months with APP.    Signed, [name], MD  09/18/2023  [facility] [address] [facility] [place] [address] [phone] (office) [phone] (fax)

## 2023-09-18 NOTE — Progress Notes (Signed)
  Echocardiogram 2D Echocardiogram has been performed.  Milda Smart 09/18/2023, 10:39 AM

## 2023-09-18 NOTE — Patient Instructions (Signed)
START Farxiga 10 mg daily.  Blood work in 10 days  Your physician recommends that you schedule a follow-up appointment in: 6 months.  If you have any questions or concerns before your next appointment please send [place] a message through [place] or call our office at [phone].    TO LEAVE A MESSAGE FOR THE NURSE SELECT OPTION 2, PLEASE LEAVE A MESSAGE INCLUDING: YOUR NAME DATE OF BIRTH CALL BACK NUMBER REASON FOR CALL**this is important as we prioritize the call backs  YOU WILL RECEIVE A CALL BACK THE SAME DAY AS LONG AS YOU CALL BEFORE 4:00 PM  At the [facility], you and your health needs are our priority. As part of our continuing mission to provide you with exceptional heart care, we have created designated Provider Care Teams. These Care Teams include your primary Cardiologist (physician) and Advanced Practice Providers (APPs- Physician Assistants and Nurse Practitioners) who all work together to provide you with the care you need, when you need it.   You may see any of the following providers on your designated Care Team at your next follow up: Dr Arvilla Meres Dr [name] Dr. Dorthula Nettles Dr. Clearnce Hasten Amy Filbert Schilder, NP Robbie Lis, [place] [facility], [place] Brynda Peon, NP [place] Lee, NP Karle Plumber, PharmD   Please be sure to bring in all your medications bottles to every appointment.    Thank you for choosing [facility]

## 2023-09-19 ENCOUNTER — Ambulatory Visit (INDEPENDENT_AMBULATORY_CARE_PROVIDER_SITE_OTHER): Payer: Medicare HMO

## 2023-09-19 DIAGNOSIS — I428 Other cardiomyopathies: Secondary | ICD-10-CM

## 2023-09-19 LAB — CUP PACEART REMOTE DEVICE CHECK
Battery Remaining Longevity: 2 mo
Battery Voltage: 2.74 V
Brady Statistic AP VP Percent: 9.44 %
Brady Statistic AP VS Percent: 0.16 %
Brady Statistic AS VP Percent: 89.16 %
Brady Statistic AS VS Percent: 1.24 %
Brady Statistic RA Percent Paced: 9.58 %
Brady Statistic RV Percent Paced: 95.84 %
Date Time Interrogation Session: 20241114043824
HighPow Impedance: 72 Ohm
Implantable Lead Connection Status: 753985
Implantable Lead Connection Status: 753985
Implantable Lead Connection Status: 753985
Implantable Lead Implant Date: 20160610
Implantable Lead Implant Date: 20160610
Implantable Lead Implant Date: 20160610
Implantable Lead Location: 753858
Implantable Lead Location: 753859
Implantable Lead Location: 753860
Implantable Lead Model: 4396
Implantable Lead Model: 5076
Implantable Pulse Generator Implant Date: 20160610
Lead Channel Impedance Value: 342 Ohm
Lead Channel Impedance Value: 399 Ohm
Lead Channel Impedance Value: 418 Ohm
Lead Channel Impedance Value: 513 Ohm
Lead Channel Impedance Value: 532 Ohm
Lead Channel Impedance Value: 608 Ohm
Lead Channel Pacing Threshold Amplitude: 0.625 V
Lead Channel Pacing Threshold Amplitude: 1.375 V
Lead Channel Pacing Threshold Amplitude: 2.125 V
Lead Channel Pacing Threshold Pulse Width: 0.4 ms
Lead Channel Pacing Threshold Pulse Width: 0.4 ms
Lead Channel Pacing Threshold Pulse Width: 0.4 ms
Lead Channel Sensing Intrinsic Amplitude: 0.75 mV
Lead Channel Sensing Intrinsic Amplitude: 0.75 mV
Lead Channel Sensing Intrinsic Amplitude: 14.125 mV
Lead Channel Sensing Intrinsic Amplitude: 14.125 mV
Lead Channel Setting Pacing Amplitude: 2 V
Lead Channel Setting Pacing Amplitude: 2.5 V
Lead Channel Setting Pacing Amplitude: 2.5 V
Lead Channel Setting Pacing Pulse Width: 0.4 ms
Lead Channel Setting Pacing Pulse Width: 0.4 ms
Lead Channel Setting Sensing Sensitivity: 0.3 mV
Zone Setting Status: 755011

## 2023-09-20 LAB — ECHOCARDIOGRAM COMPLETE
AR max vel: 2.14 cm2
AV Area VTI: 2.32 cm2
AV Area mean vel: 2.34 cm2
AV Mean grad: 7.8 mm[Hg]
AV Peak grad: 15.1 mm[Hg]
Ao pk vel: 1.94 m/s
Area-P 1/2: 3.7 cm2
Calc EF: 46 %
MV M vel: 4.1 m/s
MV Peak grad: 67.3 mm[Hg]
MV VTI: 3.4 cm2
S' Lateral: 4.2 cm
Single Plane A2C EF: 48.2 %
Single Plane A4C EF: 44 %

## 2023-10-02 DIAGNOSIS — I5022 Chronic systolic (congestive) heart failure: Secondary | ICD-10-CM | POA: Diagnosis not present

## 2023-10-02 NOTE — Progress Notes (Signed)
Remote ICD transmission.   

## 2023-10-03 LAB — BASIC METABOLIC PANEL
BUN/Creatinine Ratio: 13 (ref 12–28)
BUN: 9 mg/dL (ref 8–27)
CO2: 24 mmol/L (ref 20–29)
Calcium: 9.2 mg/dL (ref 8.7–10.3)
Chloride: 105 mmol/L (ref 96–106)
Creatinine, Ser: 0.72 mg/dL (ref 0.57–1.00)
Glucose: 90 mg/dL (ref 70–99)
Potassium: 3.9 mmol/L (ref 3.5–5.2)
Sodium: 143 mmol/L (ref 134–144)
eGFR: 92 mL/min/{1.73_m2} (ref >59)

## 2023-10-04 LAB — BRAIN NATRIURETIC PEPTIDE: BNP: 145.9 pg/mL — ABNORMAL HIGH (ref 0.0–100.0)

## 2023-10-21 ENCOUNTER — Other Ambulatory Visit: Payer: Self-pay | Admitting: Internal Medicine

## 2023-10-21 ENCOUNTER — Ambulatory Visit (INDEPENDENT_AMBULATORY_CARE_PROVIDER_SITE_OTHER): Payer: Medicare HMO

## 2023-10-21 DIAGNOSIS — I428 Other cardiomyopathies: Secondary | ICD-10-CM

## 2023-10-21 DIAGNOSIS — I5022 Chronic systolic (congestive) heart failure: Secondary | ICD-10-CM

## 2023-10-21 DIAGNOSIS — Z1231 Encounter for screening mammogram for malignant neoplasm of breast: Secondary | ICD-10-CM

## 2023-10-22 ENCOUNTER — Other Ambulatory Visit (HOSPITAL_COMMUNITY): Payer: Self-pay

## 2023-10-22 LAB — CUP PACEART REMOTE DEVICE CHECK
Battery Remaining Longevity: 1 mo
Battery Voltage: 2.71 V
Brady Statistic AP VP Percent: 15.22 %
Brady Statistic AP VS Percent: 0.23 %
Brady Statistic AS VP Percent: 83.36 %
Brady Statistic AS VS Percent: 1.19 %
Brady Statistic RA Percent Paced: 15.34 %
Brady Statistic RV Percent Paced: 94.96 %
Date Time Interrogation Session: 20241216151805
HighPow Impedance: 64 Ohm
Implantable Lead Connection Status: 753985
Implantable Lead Connection Status: 753985
Implantable Lead Connection Status: 753985
Implantable Lead Implant Date: 20160610
Implantable Lead Implant Date: 20160610
Implantable Lead Implant Date: 20160610
Implantable Lead Location: 753858
Implantable Lead Location: 753859
Implantable Lead Location: 753860
Implantable Lead Model: 4396
Implantable Lead Model: 5076
Implantable Pulse Generator Implant Date: 20160610
Lead Channel Impedance Value: 418 Ohm
Lead Channel Impedance Value: 418 Ohm
Lead Channel Impedance Value: 418 Ohm
Lead Channel Impedance Value: 475 Ohm
Lead Channel Impedance Value: 475 Ohm
Lead Channel Impedance Value: 722 Ohm
Lead Channel Pacing Threshold Amplitude: 0.5 V
Lead Channel Pacing Threshold Amplitude: 1.625 V
Lead Channel Pacing Threshold Amplitude: 2.125 V
Lead Channel Pacing Threshold Pulse Width: 0.4 ms
Lead Channel Pacing Threshold Pulse Width: 0.4 ms
Lead Channel Pacing Threshold Pulse Width: 0.4 ms
Lead Channel Sensing Intrinsic Amplitude: 0.875 mV
Lead Channel Sensing Intrinsic Amplitude: 0.875 mV
Lead Channel Sensing Intrinsic Amplitude: 17 mV
Lead Channel Sensing Intrinsic Amplitude: 17 mV
Lead Channel Setting Pacing Amplitude: 2 V
Lead Channel Setting Pacing Amplitude: 2.5 V
Lead Channel Setting Pacing Amplitude: 2.75 V
Lead Channel Setting Pacing Pulse Width: 0.4 ms
Lead Channel Setting Pacing Pulse Width: 0.4 ms
Lead Channel Setting Sensing Sensitivity: 0.3 mV
Zone Setting Status: 755011

## 2023-10-23 ENCOUNTER — Telehealth: Payer: Self-pay

## 2023-10-23 NOTE — Telephone Encounter (Signed)
Outreach made to Pt.  Advised device RRT.  Advised would send to scheduling to set up sooner follow up with Dr. Ladona Ridgel.  Discussed alarm.  Advised that would continue until she is seen.  She is ok to wait for follow up with GT.  Alert received from [employer]:  Device alert for RRT reached 12/18

## 2023-10-31 ENCOUNTER — Other Ambulatory Visit: Payer: Self-pay

## 2023-10-31 ENCOUNTER — Telehealth: Payer: Self-pay | Admitting: Internal Medicine

## 2023-10-31 ENCOUNTER — Ambulatory Visit: Payer: Medicare HMO | Attending: Internal Medicine | Admitting: Internal Medicine

## 2023-10-31 ENCOUNTER — Encounter: Payer: Self-pay | Admitting: Internal Medicine

## 2023-10-31 VITALS — BP 128/70 | HR 64 | Ht 62.0 in | Wt 171.6 lb

## 2023-10-31 DIAGNOSIS — I5022 Chronic systolic (congestive) heart failure: Secondary | ICD-10-CM | POA: Diagnosis not present

## 2023-10-31 DIAGNOSIS — Z01812 Encounter for preprocedural laboratory examination: Secondary | ICD-10-CM

## 2023-10-31 DIAGNOSIS — I428 Other cardiomyopathies: Secondary | ICD-10-CM | POA: Diagnosis not present

## 2023-10-31 LAB — CUP PACEART INCLINIC DEVICE CHECK
Date Time Interrogation Session: 20241226124246
Implantable Lead Connection Status: 753985
Implantable Lead Connection Status: 753985
Implantable Lead Connection Status: 753985
Implantable Lead Implant Date: 20160610
Implantable Lead Implant Date: 20160610
Implantable Lead Implant Date: 20160610
Implantable Lead Location: 753858
Implantable Lead Location: 753859
Implantable Lead Location: 753860
Implantable Lead Model: 4396
Implantable Lead Model: 5076
Implantable Pulse Generator Implant Date: 20160610

## 2023-10-31 NOTE — Telephone Encounter (Signed)
Call pt back and left message. Instructions and scrub at front desk.

## 2023-10-31 NOTE — Patient Instructions (Signed)
Medication Instructions:  Your physician recommends that you continue on your current medications as directed. Please refer to the Current Medication list given to you today.  *If you need a refill on your cardiac medications before your next appointment, please call your pharmacy*  Lab Work: BMET and CBC  If you have labs (blood work) drawn today and your tests are completely normal, you will receive your results only by: MyChart Message (if you have MyChart) OR A paper copy in the mail If you have any lab test that is abnormal or we need to change your treatment, we will call you to review the results.  Testing/Procedures: Dates for ICD generator change January 21 ,22, 28 February 5, 18, 19, 25  Follow-Up: At [facility], you and your health needs are our priority.  As part of our continuing mission to provide you with exceptional heart care, we have created designated Provider Care Teams.  These Care Teams include your primary Cardiologist (physician) and Advanced Practice Providers (APPs -  Physician Assistants and Nurse Practitioners) who all work together to provide you with the care you need, when you need it.   Your next appointment:   1 year(s)  The format for your next appointment:   In Person  Provider:   Lewayne Bunting, MD{or one of the following Advanced Practice Providers on your designated Care Team:   Francis Dowse, [place] Casimiro Needle "Mardelle Matte" [place], [place] Earnest Rosier, NP  Remote monitoring is used to monitor your Pacemaker/ ICD from home. This monitoring reduces the number of office visits required to check your device to one time per year. It allows [place] to keep an eye on the functioning of your device to ensure it is working properly.  Important Information About [place]

## 2023-10-31 NOTE — Progress Notes (Signed)
HPI Ms. Yearout returns today for followup. She is a pleasant 67 yo woman with chronic systolic heart failure and LBBB who underwent biv ICD insertion over 8 years ago. She has reached ERI. She has had normalization of her LV function. No ICD therapies. She denies syncope. Her weight has not changed. She has been saddened by the loss of her husband who died suddenly 2 months ago. In the interim she has been very active and had no change in her symptoms. Allergies  Allergen Reactions   Prednisone Nausea Only     Current Outpatient Medications  Medication Sig Dispense Refill   albuterol (VENTOLIN HFA) 108 (90 Base) MCG/ACT inhaler Inhale 2 puffs into the lungs every 4 (four) hours as needed.     alendronate (FOSAMAX) 70 MG tablet Take 70 mg by mouth once a week.     aspirin 81 MG tablet Take 81 mg by mouth at bedtime.      atorvastatin (LIPITOR) 10 MG tablet TAKE 1 TABLET BY MOUTH EVERYDAY AT BEDTIME 90 tablet 3   carvedilol (COREG) 12.5 MG tablet TAKE 1 TABLET BY MOUTH 2 TIMES DAILY. 180 tablet 3   cholecalciferol (VITAMIN D) 400 units TABS tablet Take 400 Units by mouth daily.     FARXIGA 10 MG TABS tablet Take 1 tablet (10 mg total) by mouth daily before breakfast. 90 tablet 3   gabapentin (NEURONTIN) 300 MG capsule Take 300 mg by mouth daily.     levothyroxine (SYNTHROID, LEVOTHROID) 100 MCG tablet Take 100 mcg by mouth daily before breakfast.     Multiple Vitamins-Minerals (MULTIVITAMIN WITH MINERALS) tablet Take 1 tablet by mouth daily.     niacin 500 MG tablet Take 1,500 mg by mouth at bedtime.     nitroGLYCERIN (NITROSTAT) 0.4 MG SL tablet Place 1 tablet (0.4 mg total) under the tongue every 5 (five) minutes as needed for chest pain. 25 tablet 3   Phenylephrine-APAP-guaiFENesin (MUCINEX SINUS-MAX PO) Take 1 tablet by mouth daily as needed (sinuses).     sacubitril-valsartan (ENTRESTO) 97-103 MG Take 1 tablet by mouth 2 (two) times daily. 180 tablet 3   spironolactone  (ALDACTONE) 25 MG tablet TAKE 1 TABLET BY MOUTH EVERY DAY 90 tablet 3   tiZANidine (ZANAFLEX) 4 MG tablet Take 4 mg by mouth 2 (two) times daily as needed.     traMADol (ULTRAM) 50 MG tablet Take 50 mg by mouth every evening.     No current facility-administered medications for this visit.     Past Medical History:  Diagnosis Date   AICD (automatic cardioverter/defibrillator) present 04/15/2015   Medtronic (serial  Number [id number]) biventricular ICD   Back pain    Recent positive ANA but told she has OA   CAD (coronary artery disease)    a.  per cath in June 2013 with nonobstructive CAD;  b.  Lexiscan Myoview (12/15):  No ischemia.  Anteroseptal and apical fixed defect - 2/2 IVCD of LBBB type vs scar, EF 25%; Intermediate Risk   Cardiomyopathy (HCC)    a.  EF 45-50% by echo 04/23/2012;  b.  Echo (12/15):  EF 10-15%, Gr 2 DD, ventricular septum dyssynergy, mod MR, mod LAE.   CHF (congestive heart failure) (HCC)    COPD (chronic obstructive pulmonary disease) (HCC)    Hypercalcemia    Due to HCTZ, improved with d/c of calcium supplements   Hypertension    Hypertriglyceridemia    Nephrolithiasis    OA (osteoarthritis)  Osteoporosis    Sleep apnea    Thyroid disease    Post-ablation hypothyroidism   Tobacco abuse     ROS:   All systems reviewed and negative except as noted in the HPI.   Past Surgical History:  Procedure Laterality Date   BIOPSY  08/08/2018   Procedure: BIOPSY;  Surgeon: Jeani Hawking, MD;  Location: [facility];  Service: Endoscopy;;   BLADDER SURGERY     bladder & bowel tac with mesh   CARPAL TUNNEL RELEASE     CESAREAN SECTION     EP IMPLANTABLE DEVICE N/A 04/15/2015   Procedure: BiV ICD Insertion CRT-D;  Surgeon: Marinus Maw, MD;  Medtronic (serial  Number [id number]) biventricular ICD   ESOPHAGOGASTRODUODENOSCOPY (EGD) WITH PROPOFOL N/A 08/08/2018   Procedure: ESOPHAGOGASTRODUODENOSCOPY (EGD) WITH PROPOFOL;  Surgeon: Jeani Hawking, MD;  Location:  [facility];  Service: Endoscopy;  Laterality: N/A;   LEFT AND RIGHT HEART CATHETERIZATION WITH CORONARY ANGIOGRAM N/A 11/12/2014   Procedure: LEFT AND RIGHT HEART CATHETERIZATION WITH CORONARY ANGIOGRAM;  Surgeon: Laurey Morale, MD;  [facility] C[facility] LAB;  Service: Cardiovascular;  Laterality: N/A;   LEFT HEART CATHETERIZATION WITH CORONARY ANGIOGRAM N/A 04/22/2012   Procedure: LEFT HEART CATHETERIZATION WITH CORONARY ANGIOGRAM;  Surgeon: Herby Abraham, MD;  [facility] CATH LAB;  Service: Cardiovascular;  Laterality: N/A;   PARTIAL HYSTERECTOMY     Due to cervical dysplasia   SAVORY DILATION N/A 08/08/2018   Procedure: SAVORY DILATION;  Surgeon: Jeani Hawking, MD;  Location: [facility] ENDOSCOPY;  Service: Endoscopy;  Laterality: N/A;   TONSILLECTOMY       Family History  Problem Relation Age of Onset   Coronary artery disease Father        s/p CABG in his 62s   Hypertension Father    Heart attack Father    COPD Mother        smoked   Colon polyps Mother    Colon cancer Maternal Uncle 74   Diabetes Sister    Cirrhosis Sister    Stroke Neg Hx      Social History   Socioeconomic History   Marital status: Married    Spouse name: Not on file   Number of children: Not on file   Years of education: Not on file   Highest education level: Not on file  Occupational History   Not on file  Tobacco Use   Smoking status: Former    Current packs/day: 0.00    Average packs/day: 2.0 packs/day for 35.0 years (70.0 ttl pk-yrs)    Types: Cigarettes    Start date: 11/06/1979    Quit date: 11/05/2014    Years since quitting: 8.9   Smokeless tobacco: Never  Substance and Sexual Activity   Alcohol use: No    Alcohol/week: 0.0 standard drinks of alcohol   Drug use: No   Sexual activity: Not Currently  Other Topics Concern   Not on file  Social History Narrative   Not on file   Social Drivers of Health   Financial Resource Strain: Not on file  Food Insecurity: Not on file   Transportation Needs: Not on file  Physical Activity: Not on file  Stress: Not on file  Social Connections: Unknown (03/20/2022)   [facility]   Social Network    Social Network: Not on file  Intimate Partner Violence: Unknown (02/09/2022)   Received from [facility], [facility]   HITS    Physically Hurt: Not  on file    Insult or Talk Down To: Not on file    Threaten Physical Harm: Not on file    Scream or Curse: Not on file     BP 128/70 (BP Location: Left Arm, Patient Position: Sitting, Cuff Size: Large)   Pulse 64   Ht 5\' 2"  (1.575 m)   Wt 171 lb 9.6 oz (77.8 kg)   SpO2 95%   BMI 31.39 kg/m   Physical Exam:  Well appearing NAD HEENT: Unremarkable Neck:  No JVD, no thyromegally Lymphatics:  No adenopathy Back:  No CVA tenderness Lungs:  Clear with no wheezes HEART:  Regular rate rhythm, no murmurs, no rubs, no clicks Abd:  soft, positive bowel sounds, no organomegally, no rebound, no guarding Ext:  2 plus pulses, no edema, no cyanosis, no clubbing Skin:  No rashes no nodules Neuro:  CN II through XII intact, motor grossly intact  DEVICE  Normal device function.  See PaceArt for details. ERI  Assess/Plan:  Chronic systolic heart failure - she has had normalization of her LV function and she will continue her GDMT. ICD - she is approaching ERI. Her EF is down to 45%. We will plan ICD change out rather than down grade to a PPM. Atrial threshold - her atrial threshold is chronically elevated. She paces 15%. We will reprogram the device. No plans for extraction at this time.  Obesity - she is encouraged to lose weight.    Sharlot Gowda Kaeson Kleinert,MD

## 2023-10-31 NOTE — Telephone Encounter (Signed)
Noted  

## 2023-10-31 NOTE — Telephone Encounter (Signed)
Pt states she is available 1/28. Please advise

## 2023-11-04 DIAGNOSIS — Z01812 Encounter for preprocedural laboratory examination: Secondary | ICD-10-CM | POA: Diagnosis not present

## 2023-11-04 LAB — CBC
Hematocrit: 45.1 % (ref 34.0–46.6)
Hemoglobin: 14.5 g/dL (ref 11.1–15.9)
MCH: 30.1 pg (ref 26.6–33.0)
MCHC: 32.2 g/dL (ref 31.5–35.7)
MCV: 94 fL (ref 79–97)
Platelets: 225 10*3/uL (ref 150–450)
RBC: 4.82 x10E6/uL (ref 3.77–5.28)
RDW: 12.4 % (ref 11.7–15.4)
WBC: 9.8 10*3/uL (ref 3.4–10.8)

## 2023-11-05 LAB — BASIC METABOLIC PANEL
BUN/Creatinine Ratio: 14 (ref 12–28)
BUN: 11 mg/dL (ref 8–27)
CO2: 24 mmol/L (ref 20–29)
Calcium: 9.4 mg/dL (ref 8.7–10.3)
Chloride: 106 mmol/L (ref 96–106)
Creatinine, Ser: 0.79 mg/dL (ref 0.57–1.00)
Glucose: 90 mg/dL (ref 70–99)
Potassium: 4.1 mmol/L (ref 3.5–5.2)
Sodium: 144 mmol/L (ref 134–144)
eGFR: 82 mL/min/{1.73_m2} (ref >59)

## 2023-11-21 ENCOUNTER — Ambulatory Visit (INDEPENDENT_AMBULATORY_CARE_PROVIDER_SITE_OTHER): Payer: HMO

## 2023-11-21 DIAGNOSIS — I428 Other cardiomyopathies: Secondary | ICD-10-CM

## 2023-11-25 LAB — CUP PACEART REMOTE DEVICE CHECK
Battery Remaining Longevity: 1 mo — CL
Battery Voltage: 2.68 V
Brady Statistic AP VP Percent: 18.4 %
Brady Statistic AP VS Percent: 0.09 %
Brady Statistic AS VP Percent: 80.4 %
Brady Statistic AS VS Percent: 1.11 %
Brady Statistic RA Percent Paced: 17.75 %
Brady Statistic RV Percent Paced: 93.44 %
Date Time Interrogation Session: 20250118030607
HighPow Impedance: 70 Ohm
Implantable Lead Connection Status: 753985
Implantable Lead Connection Status: 753985
Implantable Lead Connection Status: 753985
Implantable Lead Implant Date: 20160610
Implantable Lead Implant Date: 20160610
Implantable Lead Implant Date: 20160610
Implantable Lead Location: 753858
Implantable Lead Location: 753859
Implantable Lead Location: 753860
Implantable Lead Model: 4396
Implantable Lead Model: 5076
Implantable Pulse Generator Implant Date: 20160610
Lead Channel Impedance Value: 399 Ohm
Lead Channel Impedance Value: 418 Ohm
Lead Channel Impedance Value: 456 Ohm
Lead Channel Impedance Value: 532 Ohm
Lead Channel Impedance Value: 551 Ohm
Lead Channel Impedance Value: 703 Ohm
Lead Channel Pacing Threshold Amplitude: 0.5 V
Lead Channel Pacing Threshold Amplitude: 1.125 V
Lead Channel Pacing Threshold Amplitude: 2.125 V
Lead Channel Pacing Threshold Pulse Width: 0.4 ms
Lead Channel Pacing Threshold Pulse Width: 0.4 ms
Lead Channel Pacing Threshold Pulse Width: 0.4 ms
Lead Channel Sensing Intrinsic Amplitude: 0.5 mV
Lead Channel Sensing Intrinsic Amplitude: 0.5 mV
Lead Channel Sensing Intrinsic Amplitude: 18.375 mV
Lead Channel Sensing Intrinsic Amplitude: 18.375 mV
Lead Channel Setting Pacing Amplitude: 2 V
Lead Channel Setting Pacing Amplitude: 2.25 V
Lead Channel Setting Pacing Amplitude: 2.5 V
Lead Channel Setting Pacing Pulse Width: 0.4 ms
Lead Channel Setting Pacing Pulse Width: 0.4 ms
Lead Channel Setting Sensing Sensitivity: 0.3 mV
Zone Setting Status: 755011

## 2023-11-25 NOTE — Addendum Note (Signed)
Addended by: Geralyn Flash D on: 11/25/2023 11:01 AM   Modules accepted: Orders, Level of Service

## 2023-11-25 NOTE — Progress Notes (Signed)
Remote ICD transmission.   

## 2023-11-29 ENCOUNTER — Ambulatory Visit
Admission: RE | Admit: 2023-11-29 | Discharge: 2023-11-29 | Disposition: A | Discharge status: Home / Self Care | Payer: HMO | Source: Ambulatory Visit | Attending: Internal Medicine | Admitting: Internal Medicine

## 2023-11-29 DIAGNOSIS — Z1231 Encounter for screening mammogram for malignant neoplasm of breast: Secondary | ICD-10-CM | POA: Diagnosis not present

## 2023-12-02 DIAGNOSIS — M150 Primary generalized (osteo)arthritis: Secondary | ICD-10-CM | POA: Diagnosis not present

## 2023-12-02 DIAGNOSIS — M51369 Other intervertebral disc degeneration, lumbar region without mention of lumbar back pain or lower extremity pain: Secondary | ICD-10-CM | POA: Diagnosis not present

## 2023-12-02 DIAGNOSIS — M549 Dorsalgia, unspecified: Secondary | ICD-10-CM | POA: Diagnosis not present

## 2023-12-02 DIAGNOSIS — I429 Cardiomyopathy, unspecified: Secondary | ICD-10-CM | POA: Diagnosis not present

## 2023-12-02 DIAGNOSIS — M1990 Unspecified osteoarthritis, unspecified site: Secondary | ICD-10-CM | POA: Diagnosis not present

## 2023-12-02 NOTE — Pre-Procedure Instructions (Signed)
Instructed patient on the following items: Arrival time 0730 Nothing to eat or drink after midnight No meds AM of procedure Responsible person to drive you home and stay with you for 24 hrs Wash with special soap night before and morning of procedure

## 2023-12-03 ENCOUNTER — Encounter (HOSPITAL_COMMUNITY)
Admission: RE | Disposition: A | Discharge status: Home / Self Care | Payer: Self-pay | Source: Home / Self Care | Attending: Internal Medicine

## 2023-12-03 ENCOUNTER — Ambulatory Visit (HOSPITAL_COMMUNITY)
Admission: RE | Admit: 2023-12-03 | Discharge: 2023-12-03 | Disposition: A | Discharge status: Home / Self Care | Payer: HMO | Attending: Internal Medicine | Admitting: Internal Medicine

## 2023-12-03 ENCOUNTER — Other Ambulatory Visit: Payer: Self-pay

## 2023-12-03 DIAGNOSIS — Z6831 Body mass index (BMI) 31.0-31.9, adult: Secondary | ICD-10-CM | POA: Insufficient documentation

## 2023-12-03 DIAGNOSIS — I110 Hypertensive heart disease with heart failure: Secondary | ICD-10-CM | POA: Diagnosis not present

## 2023-12-03 DIAGNOSIS — I447 Left bundle-branch block, unspecified: Secondary | ICD-10-CM | POA: Diagnosis not present

## 2023-12-03 DIAGNOSIS — I429 Cardiomyopathy, unspecified: Secondary | ICD-10-CM

## 2023-12-03 DIAGNOSIS — Z87891 Personal history of nicotine dependence: Secondary | ICD-10-CM | POA: Insufficient documentation

## 2023-12-03 DIAGNOSIS — Z4502 Encounter for adjustment and management of automatic implantable cardiac defibrillator: Secondary | ICD-10-CM | POA: Insufficient documentation

## 2023-12-03 DIAGNOSIS — I5022 Chronic systolic (congestive) heart failure: Secondary | ICD-10-CM | POA: Insufficient documentation

## 2023-12-03 DIAGNOSIS — Z95810 Presence of automatic (implantable) cardiac defibrillator: Secondary | ICD-10-CM

## 2023-12-03 DIAGNOSIS — E669 Obesity, unspecified: Secondary | ICD-10-CM | POA: Insufficient documentation

## 2023-12-03 DIAGNOSIS — I2510 Atherosclerotic heart disease of native coronary artery without angina pectoris: Secondary | ICD-10-CM

## 2023-12-03 HISTORY — PX: ICD GENERATOR CHANGEOUT: EP1231

## 2023-12-03 SURGERY — ICD GENERATOR CHANGEOUT
Anesthesia: LOCAL

## 2023-12-03 MED ORDER — CEFAZOLIN SODIUM-DEXTROSE 2-4 GM/100ML-% IV SOLN
INTRAVENOUS | Status: AC
  Filled 2023-12-03: qty 100

## 2023-12-03 MED ORDER — MIDAZOLAM HCL 2 MG/2ML IJ SOLN
INTRAMUSCULAR | Status: AC
  Filled 2023-12-03: qty 2

## 2023-12-03 MED ORDER — SODIUM CHLORIDE 0.9 % IV SOLN: INTRAVENOUS | Status: DC

## 2023-12-03 MED ORDER — MIDAZOLAM HCL 5 MG/5ML IJ SOLN
INTRAMUSCULAR | Status: DC | PRN
  Administered 2023-12-03: 1 mg via INTRAVENOUS

## 2023-12-03 MED ORDER — SODIUM CHLORIDE 0.9 % IV SOLN
INTRAVENOUS | Status: AC
  Filled 2023-12-03: qty 2

## 2023-12-03 MED ORDER — FENTANYL CITRATE (PF) 100 MCG/2ML IJ SOLN
INTRAMUSCULAR | Status: DC | PRN
  Administered 2023-12-03: 12.5 ug via INTRAVENOUS

## 2023-12-03 MED ORDER — SODIUM CHLORIDE 0.9 % IV SOLN
80.0000 mg | INTRAVENOUS | Status: AC
  Administered 2023-12-03: 80 mg

## 2023-12-03 MED ORDER — POVIDONE-IODINE 10 % EX SWAB
2.0000 | Freq: Once | CUTANEOUS | Status: AC
  Administered 2023-12-03: 2 via TOPICAL

## 2023-12-03 MED ORDER — ONDANSETRON HCL 4 MG/2ML IJ SOLN: 4.0000 mg | Freq: Four times a day (QID) | INTRAMUSCULAR | Status: DC | PRN

## 2023-12-03 MED ORDER — CEFAZOLIN SODIUM-DEXTROSE 2-4 GM/100ML-% IV SOLN
2.0000 g | INTRAVENOUS | Status: AC
Start: 2023-12-03 — End: 2023-12-03
  Administered 2023-12-03: 2 g via INTRAVENOUS

## 2023-12-03 MED ORDER — ACETAMINOPHEN 325 MG PO TABS: 325.0000 mg | ORAL_TABLET | ORAL | Status: DC | PRN

## 2023-12-03 MED ORDER — CHLORHEXIDINE GLUCONATE 4 % EX SOLN
4.0000 | Freq: Once | CUTANEOUS | Status: DC
  Filled 2023-12-03: qty 60

## 2023-12-03 MED ORDER — FENTANYL CITRATE (PF) 100 MCG/2ML IJ SOLN
INTRAMUSCULAR | Status: AC
  Filled 2023-12-03: qty 2

## 2023-12-03 MED ORDER — LIDOCAINE HCL (PF) 1 % IJ SOLN
INTRAMUSCULAR | Status: DC | PRN
  Administered 2023-12-03: 60 mL

## 2023-12-03 MED ORDER — LIDOCAINE HCL (PF) 1 % IJ SOLN
INTRAMUSCULAR | Status: AC
  Filled 2023-12-03: qty 60

## 2023-12-03 SURGICAL SUPPLY — 5 items
ICD COBALT XT CRT DTPA2D1 (ICD Generator) IMPLANT
PAD DEFIB RADIO PHYSIO CONN (PAD) ×2 IMPLANT
POUCH AIGIS-R ANTIBACT ICD (Mesh General) ×1 IMPLANT
POUCH AIGIS-R ANTIBACT ICD LRG (Mesh General) IMPLANT
TRAY PACEMAKER INSERTION (PACKS) ×2 IMPLANT

## 2023-12-03 NOTE — Discharge Instructions (Signed)

## 2023-12-03 NOTE — H&P (Signed)
HPI Ms. Roel returns today for followup. She is a pleasant 68 yo woman with chronic systolic heart failure and LBBB who underwent biv ICD insertion over 8 years ago. She has reached ERI. She has had normalization of her LV function. No ICD therapies. She denies syncope. Her weight has not changed. She has been saddened by the loss of her husband who died suddenly 2 months ago. In the interim she has been very active and had no change in her symptoms. Allergies      Allergies  Allergen Reactions   Prednisone Nausea Only                Current Outpatient Medications  Medication Sig Dispense Refill   albuterol (VENTOLIN HFA) 108 (90 Base) MCG/ACT inhaler Inhale 2 puffs into the lungs every 4 (four) hours as needed.       alendronate (FOSAMAX) 70 MG tablet Take 70 mg by mouth once a week.       aspirin 81 MG tablet Take 81 mg by mouth at bedtime.        atorvastatin (LIPITOR) 10 MG tablet TAKE 1 TABLET BY MOUTH EVERYDAY AT BEDTIME 90 tablet 3   carvedilol (COREG) 12.5 MG tablet TAKE 1 TABLET BY MOUTH 2 TIMES DAILY. 180 tablet 3   cholecalciferol (VITAMIN D) 400 units TABS tablet Take 400 Units by mouth daily.       FARXIGA 10 MG TABS tablet Take 1 tablet (10 mg total) by mouth daily before breakfast. 90 tablet 3   gabapentin (NEURONTIN) 300 MG capsule Take 300 mg by mouth daily.       levothyroxine (SYNTHROID, LEVOTHROID) 100 MCG tablet Take 100 mcg by mouth daily before breakfast.       Multiple Vitamins-Minerals (MULTIVITAMIN WITH MINERALS) tablet Take 1 tablet by mouth daily.       niacin 500 MG tablet Take 1,500 mg by mouth at bedtime.       nitroGLYCERIN (NITROSTAT) 0.4 MG SL tablet Place 1 tablet (0.4 mg total) under the tongue every 5 (five) minutes as needed for chest pain. 25 tablet 3   Phenylephrine-APAP-guaiFENesin (MUCINEX SINUS-MAX PO) Take 1 tablet by mouth daily as needed (sinuses).       sacubitril-valsartan (ENTRESTO) 97-103 MG Take 1 tablet by mouth 2 (two)  times daily. 180 tablet 3   spironolactone (ALDACTONE) 25 MG tablet TAKE 1 TABLET BY MOUTH EVERY DAY 90 tablet 3   tiZANidine (ZANAFLEX) 4 MG tablet Take 4 mg by mouth 2 (two) times daily as needed.       traMADol (ULTRAM) 50 MG tablet Take 50 mg by mouth every evening.          No current facility-administered medications for this visit.              Past Medical History:  Diagnosis Date   AICD (automatic cardioverter/defibrillator) present 04/15/2015    Medtronic (serial  Number [id number]) biventricular ICD   Back pain      Recent positive ANA but told she has OA   CAD (coronary artery disease)      a.  per cath in June 2013 with nonobstructive CAD;  b.  Lexiscan Myoview (12/15):  No ischemia.  Anteroseptal and apical fixed defect - 2/2 IVCD of LBBB type vs scar, EF 25%; Intermediate Risk   Cardiomyopathy (HCC)      a.  EF 45-50% by echo 04/23/2012;  b.  Echo (12/15):  EF 10-15%, Gr 2 DD, ventricular septum dyssynergy, mod MR, mod LAE.   CHF (congestive heart failure) (HCC)     COPD (chronic obstructive pulmonary disease) (HCC)     Hypercalcemia      Due to HCTZ, improved with d/c of calcium supplements   Hypertension     Hypertriglyceridemia     Nephrolithiasis     OA (osteoarthritis)     Osteoporosis     Sleep apnea     Thyroid disease      Post-ablation hypothyroidism   Tobacco abuse            ROS:    All systems reviewed and negative except as noted in the HPI.          Past Surgical History:  Procedure Laterality Date   BIOPSY   08/08/2018    Procedure: BIOPSY;  Surgeon: Jeani Hawking, MD;  Location: [facility];  Service: Endoscopy;;   BLADDER SURGERY        bladder & bowel tac with mesh   CARPAL TUNNEL RELEASE       CESAREAN SECTION       EP IMPLANTABLE DEVICE N/A 04/15/2015    Procedure: BiV ICD Insertion CRT-D;  Surgeon: Marinus Maw, MD;  Medtronic (serial  Number [id number]) biventricular ICD   ESOPHAGOGASTRODUODENOSCOPY (EGD) WITH PROPOFOL N/A  08/08/2018    Procedure: ESOPHAGOGASTRODUODENOSCOPY (EGD) WITH PROPOFOL;  Surgeon: Jeani Hawking, MD;  Location: [facility];  Service: Endoscopy;  Laterality: N/A;   LEFT AND RIGHT HEART CATHETERIZATION WITH CORONARY ANGIOGRAM N/A 11/12/2014    Procedure: LEFT AND RIGHT HEART CATHETERIZATION WITH CORONARY ANGIOGRAM;  Surgeon: Laurey Morale, MD;  Location: [facility] CATH LAB;  Service: Cardiovascular;  Laterality: N/A;   LEFT HEART CATHETERIZATION WITH CORONARY ANGIOGRAM N/A 04/22/2012    Procedure: LEFT HEART CATHETERIZATION WITH CORONARY ANGIOGRAM;  Surgeon: Herby Abraham, MD;  Location: [facility] CATH LAB;  Service: Cardiovascular;  Laterality: N/A;   PARTIAL HYSTERECTOMY        Due to cervical dysplasia   SAVORY DILATION N/A 08/08/2018    Procedure: SAVORY DILATION;  Surgeon: Jeani Hawking, MD;  Location: [facility];  Service: Endoscopy;  Laterality: N/A;   TONSILLECTOMY                     Family History  Problem Relation Age of Onset   Coronary artery disease Father          s/p CABG in his 69s   Hypertension Father     Heart attack Father     COPD Mother          smoked   Colon polyps Mother     Colon cancer Maternal Uncle 71   Diabetes Sister     Cirrhosis Sister     Stroke Neg Hx              Social History         Socioeconomic History   Marital status: Married      Spouse name: Not on file   Number of children: Not on file   Years of education: Not on file   Highest education level: Not on file  Occupational History   Not on file  Tobacco Use   Smoking status: Former      Current packs/day: 0.00      Average packs/day: 2.0 packs/day for 35.0 years (70.0 ttl pk-yrs)      Types: Cigarettes      Start  date: 11/06/1979      Quit date: 11/05/2014      Years since quitting: 8.9   Smokeless tobacco: Never  Substance and Sexual Activity   Alcohol use: No      Alcohol/week: 0.0 standard drinks of alcohol   Drug use: No   Sexual activity: Not Currently  Other Topics  Concern   Not on file  Social History Narrative   Not on file    Social Drivers of Health        Financial Resource Strain: Not on file  Food Insecurity: Not on file  Transportation Needs: Not on file  Physical Activity: Not on file  Stress: Not on file  Social Connections: Unknown (03/20/2022)    Received from [facility], [facility]    Social Network     Social Network: Not on file  Intimate Partner Violence: Unknown (02/09/2022)    Received from [employer], [facility]    HITS     Physically Hurt: Not on file     Insult or Talk Down To: Not on file     Threaten Physical Harm: Not on file     Scream or Curse: Not on file        BP 128/70 (BP Location: Left Arm, Patient Position: Sitting, Cuff Size: Large)   Pulse 64   Ht 5\' 2"  (1.575 m)   Wt 171 lb 9.6 oz (77.8 kg)   SpO2 95%   BMI 31.39 kg/m    Physical Exam:   Well appearing NAD HEENT: Unremarkable Neck:  No JVD, no thyromegally Lymphatics:  No adenopathy Back:  No CVA tenderness Lungs:  Clear with no wheezes HEART:  Regular rate rhythm, no murmurs, no rubs, no clicks Abd:  soft, positive bowel sounds, no organomegally, no rebound, no guarding Ext:  2 plus pulses, no edema, no cyanosis, no clubbing Skin:  No rashes no nodules Neuro:  CN II through XII intact, motor grossly intact   DEVICE  Normal device function.  See PaceArt for details. ERI   Assess/Plan:   Chronic systolic heart failure - she has had normalization of her LV function and she will continue her GDMT. ICD - she is approaching ERI. Her EF is down to 45%. We will plan ICD change out rather than down grade to a PPM. Atrial threshold - her atrial threshold is chronically elevated. She paces 15%. We will reprogram the device. No plans for extraction at this time.  Obesity - she is encouraged to lose weight.    Dorathy Daft  EP Attending  In the interim she has reached ERI and will undergo ICD gen change out.   Sharlot Gowda Jennice Renegar,MD

## 2023-12-04 ENCOUNTER — Encounter (HOSPITAL_COMMUNITY): Payer: Self-pay | Admitting: Internal Medicine

## 2023-12-04 MED FILL — Midazolam HCl Inj 2 MG/2ML (Base Equivalent): INTRAMUSCULAR | Qty: 1 | Status: AC

## 2023-12-05 ENCOUNTER — Other Ambulatory Visit (HOSPITAL_COMMUNITY): Payer: Self-pay | Admitting: Cardiology

## 2023-12-18 ENCOUNTER — Ambulatory Visit: Payer: HMO | Attending: Cardiology

## 2023-12-18 DIAGNOSIS — I5022 Chronic systolic (congestive) heart failure: Secondary | ICD-10-CM | POA: Diagnosis not present

## 2023-12-18 DIAGNOSIS — I447 Left bundle-branch block, unspecified: Secondary | ICD-10-CM

## 2023-12-18 DIAGNOSIS — I428 Other cardiomyopathies: Secondary | ICD-10-CM

## 2023-12-18 LAB — CUP PACEART INCLINIC DEVICE CHECK
Date Time Interrogation Session: 20250212161658
Implantable Lead Connection Status: 753985
Implantable Lead Connection Status: 753985
Implantable Lead Connection Status: 753985
Implantable Lead Implant Date: 20160610
Implantable Lead Implant Date: 20160610
Implantable Lead Implant Date: 20160610
Implantable Lead Location: 753858
Implantable Lead Location: 753859
Implantable Lead Location: 753860
Implantable Lead Model: 4396
Implantable Lead Model: 5076
Implantable Pulse Generator Implant Date: 20250128

## 2023-12-18 NOTE — Patient Instructions (Signed)
After Your ICD (Implantable Cardiac Defibrillator)    Monitor your defibrillator site for redness, swelling, and drainage. Call the device clinic at [phone] if you experience these symptoms or fever/chills.  Your incision was closed with Steri-strips or staples:  You may shower 7 days after your procedure and wash your incision with soap and water. Avoid lotions, ointments, or perfumes over your incision until it is well-healed.  You may use a hot tub or a pool after your wound check appointment if the incision is completely closed.   There are no other restrictions in arm movement after your wound check appointment.  Your ICD is designed to protect you from life threatening heart rhythms. Because of this, you may receive a shock.   1 shock with no symptoms:  Call the office during business hours. 1 shock with symptoms (chest pain, chest pressure, dizziness, lightheadedness, shortness of breath, overall feeling unwell):  Call 911. If you experience 2 or more shocks in 24 hours:  Call 911. If you receive a shock, you should not drive.  [place] DMV - no driving for 6 months if you receive appropriate therapy from your ICD.   ICD Alerts:  Some alerts are vibratory and others beep. These are NOT emergencies. Please call our office to let us know. If this occurs at night or on weekends, it can wait until the next business day. Send a remote transmission.  If your device is capable of reading fluid status (for heart failure), you will be offered monthly monitoring to review this with you.   Remote monitoring is used to monitor your ICD from home. This monitoring is scheduled every 91 days by our office. It allows [place] to keep an eye on the functioning of your device to ensure it is working properly. You will routinely see your Electrophysiologist annually (more often if necessary).

## 2023-12-18 NOTE — Progress Notes (Signed)
Normal ICD wound check. Wound well healed. Thresholds, sensing, and impedances consistent with implant measurements with 3.5V safety margin/auto capture until 3 month visit. No episodes.  Reviewed arm restrictions to continue for 6 weeks total post op. Reviewed shock plan.  Pt enrolled in remote follow-up.  Atrial output hard programmed to 3.5 V @ 1.0 mS with PA at bedside for instruction.

## 2023-12-30 NOTE — Progress Notes (Signed)
 Remote ICD transmission.

## 2023-12-30 NOTE — Addendum Note (Signed)
 Addended by: Elease Etienne A on: 12/30/2023 08:48 AM   Modules accepted: Orders

## 2024-01-08 DIAGNOSIS — G629 Polyneuropathy, unspecified: Secondary | ICD-10-CM | POA: Diagnosis not present

## 2024-01-08 DIAGNOSIS — M7741 Metatarsalgia, right foot: Secondary | ICD-10-CM | POA: Diagnosis not present

## 2024-01-08 DIAGNOSIS — M2141 Flat foot [pes planus] (acquired), right foot: Secondary | ICD-10-CM | POA: Diagnosis not present

## 2024-01-08 DIAGNOSIS — M792 Neuralgia and neuritis, unspecified: Secondary | ICD-10-CM | POA: Diagnosis not present

## 2024-01-08 DIAGNOSIS — I739 Peripheral vascular disease, unspecified: Secondary | ICD-10-CM | POA: Diagnosis not present

## 2024-01-08 DIAGNOSIS — M2142 Flat foot [pes planus] (acquired), left foot: Secondary | ICD-10-CM | POA: Diagnosis not present

## 2024-01-08 DIAGNOSIS — M7742 Metatarsalgia, left foot: Secondary | ICD-10-CM | POA: Diagnosis not present

## 2024-02-04 DIAGNOSIS — Z0000 Encounter for general adult medical examination without abnormal findings: Secondary | ICD-10-CM | POA: Diagnosis not present

## 2024-02-04 DIAGNOSIS — E559 Vitamin D deficiency, unspecified: Secondary | ICD-10-CM | POA: Diagnosis not present

## 2024-02-04 DIAGNOSIS — I10 Essential (primary) hypertension: Secondary | ICD-10-CM | POA: Diagnosis not present

## 2024-02-04 DIAGNOSIS — E7800 Pure hypercholesterolemia, unspecified: Secondary | ICD-10-CM | POA: Diagnosis not present

## 2024-02-06 ENCOUNTER — Ambulatory Visit (HOSPITAL_COMMUNITY)
Admission: RE | Admit: 2024-02-06 | Discharge: 2024-02-06 | Disposition: A | Discharge status: Home / Self Care | Source: Ambulatory Visit | Attending: Nurse Practitioner | Admitting: Nurse Practitioner

## 2024-02-06 ENCOUNTER — Telehealth: Payer: Self-pay | Admitting: Nurse Practitioner

## 2024-02-06 ENCOUNTER — Ambulatory Visit
Admission: EM | Admit: 2024-02-06 | Discharge: 2024-02-06 | Disposition: A | Discharge status: Home / Self Care | Attending: Nurse Practitioner | Admitting: Nurse Practitioner

## 2024-02-06 DIAGNOSIS — M79641 Pain in right hand: Secondary | ICD-10-CM | POA: Diagnosis not present

## 2024-02-06 DIAGNOSIS — W19XXXA Unspecified fall, initial encounter: Secondary | ICD-10-CM | POA: Insufficient documentation

## 2024-02-06 DIAGNOSIS — M85841 Other specified disorders of bone density and structure, right hand: Secondary | ICD-10-CM | POA: Diagnosis not present

## 2024-02-06 NOTE — ED Triage Notes (Signed)
 Pt reports fall yesterday, right hand went completely back under her. States it has been swollen and stiff since the fall.

## 2024-02-06 NOTE — Telephone Encounter (Signed)
 Call patient to discuss results of x-ray of right hand.  Spoke with patient, verified 2 patient identifiers.  Patient advised that the x-ray of her right hand was negative for fracture.  Patient advised to continue with current treatment plan, discussed indications regarding follow-up.  Patient was in agreement with this plan of care and verbalized understanding.  All questions were answered.

## 2024-02-06 NOTE — ED Provider Notes (Signed)
 [facility]    CSN: [id number] Arrival date & time: 02/06/24  1159      History   Chief Complaint No chief complaint on file.   HPI Stacy Hernandez is a 68 y.o. female.   The history is provided by the patient.   Patient presents for complaints of right hand pain status post fall x 1 day.  Patient states when she fell, the right hand bent backwards.  She now presents with pain to the backside of her hand.  She denies wrist pain, numbness, tingling, or decreased range of motion.  States that the hand has been swollen and stiff since the fall.  States she is concerned she may have a fracture due to her underlying history of osteoporosis.  Past Medical History:  Diagnosis Date   AICD (automatic cardioverter/defibrillator) present 04/15/2015   Medtronic (serial  Number [id number]) biventricular ICD   Back pain    Recent positive ANA but told she has OA   CAD (coronary artery disease)    a.  per cath in June 2013 with nonobstructive CAD;  b.  Lexiscan Myoview (12/15):  No ischemia.  Anteroseptal and apical fixed defect - 2/2 IVCD of LBBB type vs scar, EF 25%; Intermediate Risk   Cardiomyopathy (HCC)    a.  EF 45-50% by echo 04/23/2012;  b.  Echo (12/15):  EF 10-15%, Gr 2 DD, ventricular septum dyssynergy, mod MR, mod LAE.   CHF (congestive heart failure) (HCC)    COPD (chronic obstructive pulmonary disease) (HCC)    Hypercalcemia    Due to HCTZ, improved with d/c of calcium supplements   Hypertension    Hypertriglyceridemia    Nephrolithiasis    OA (osteoarthritis)    Osteoporosis    Sleep apnea    Thyroid disease    Post-ablation hypothyroidism   Tobacco abuse     Patient Active Problem List   Diagnosis Date Noted   Biventricular ICD (implantable cardioverter-defibrillator) in place 05/21/2019   Obesity 06/07/2015   S/P ICD (internal cardiac defibrillator) procedure - Medtronic (serial  Number [id number]) biventricular ICD 04/15/2015   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   Dyspnea 03/11/2015   OSA (obstructive sleep apnea) 02/10/2015   Acute sinusitis 12/16/2014   Chronic systolic CHF (congestive heart failure) (HCC) 12/16/2014   Angina decubitus (HCC) 11/04/2014   Cardiomyopathy (HCC) 10/12/2012   CAD (coronary artery disease) 05/14/2012   HTN (hypertension) 05/14/2012   HLD (hyperlipidemia) 05/14/2012   LV dysfunction 05/14/2012    Past Surgical History:  Procedure Laterality Date   BIOPSY  08/08/2018   Procedure: BIOPSY;  Surgeon: Jeani Hawking, MD;  Location: [facility];  Service: Endoscopy;;   BLADDER SURGERY     bladder & bowel tac with mesh   CARPAL TUNNEL RELEASE     CESAREAN SECTION     EP IMPLANTABLE DEVICE N/A 04/15/2015   Procedure: BiV ICD Insertion CRT-D;  Surgeon: Marinus Maw, MD;  Medtronic (serial  Number [id number]) biventricular ICD   ESOPHAGOGASTRODUODENOSCOPY (EGD) WITH PROPOFOL N/A 08/08/2018   Procedure: ESOPHAGOGASTRODUODENOSCOPY (EGD) WITH PROPOFOL;  Surgeon: Jeani Hawking, MD;  Location: [facility];  Service: Endoscopy;  Laterality: N/A;   ICD GENERATOR CHANGEOUT N/A 12/03/2023   Procedure: ICD GENERATOR CHANGEOUT;  Surgeon: Marinus Maw, MD;  Location: [facility] INVASIVE CV LAB;  Service: Cardiovascular;  Laterality: N/A;   LEFT AND RIGHT HEART CATHETERIZATION WITH CORONARY ANGIOGRAM N/A 11/12/2014   Procedure: LEFT AND RIGHT HEART CATHETERIZATION WITH CORONARY ANGIOGRAM;  Surgeon: Laurey Morale, MD;  Location: [facility] CATH LAB;  Service: Cardiovascular;  Laterality: N/A;   LEFT HEART CATHETERIZATION WITH CORONARY ANGIOGRAM N/A 04/22/2012   Procedure: LEFT HEART CATHETERIZATION WITH CORONARY ANGIOGRAM;  Surgeon: Herby Abraham, MD;  Location: [facility] CATH LAB;  Service: Cardiovascular;  Laterality: N/A;   PARTIAL HYSTERECTOMY     Due to cervical dysplasia   SAVORY DILATION N/A 08/08/2018   Procedure: SAVORY DILATION;  Surgeon: Jeani Hawking, MD;  Location: [facility];  Service: Endoscopy;  Laterality: N/A;    TONSILLECTOMY      OB History   No obstetric history on file.      Home Medications    Prior to Admission medications   Medication Sig Start Date End Date Taking? Authorizing Provider  albuterol (VENTOLIN HFA) 108 (90 Base) MCG/ACT inhaler Inhale 2 puffs into the lungs every 4 (four) hours as needed. 08/04/21   [provider]  alendronate (FOSAMAX) 70 MG tablet Take 70 mg by mouth every Sunday.    [provider]  aspirin 81 MG tablet Take 81 mg by mouth at bedtime.     [provider]  atorvastatin (LIPITOR) 10 MG tablet TAKE 1 TABLET BY MOUTH EVERYDAY AT BEDTIME 04/29/23   Laurey Morale, MD  carvedilol (COREG) 12.5 MG tablet TAKE 1 TABLET BY MOUTH 2 TIMES DAILY. 07/23/23   Laurey Morale, MD  cholecalciferol (VITAMIN D3) 25 MCG (1000 UNIT) tablet Take 1,000 Units by mouth daily.    [provider]  FARXIGA 10 MG TABS tablet Take 1 tablet (10 mg total) by mouth daily before breakfast. 09/18/23   Laurey Morale, MD  gabapentin (NEURONTIN) 300 MG capsule Take 300 mg by mouth 2 (two) times daily.    [provider]  Glucosamine-Chondroitin (OSTEO BI-FLEX REGULAR STRENGTH PO) Take 2 tablets by mouth daily.    [provider]  levothyroxine (SYNTHROID, LEVOTHROID) 100 MCG tablet Take 100 mcg by mouth daily before breakfast.    [provider]  Multiple Vitamins-Minerals (MULTIVITAMIN WITH MINERALS) tablet Take 1 tablet by mouth daily.    [provider]  niacin 500 MG tablet Take 1,500 mg by mouth at bedtime.    [provider]  nitroGLYCERIN (NITROSTAT) 0.4 MG SL tablet Place 1 tablet (0.4 mg total) under the tongue every 5 (five) minutes as needed for chest pain. 09/18/23   Laurey Morale, MD  sacubitril-valsartan (ENTRESTO) 97-103 MG Take 1 tablet by mouth 2 (two) times daily. 09/11/23   Laurey Morale, MD  spironolactone (ALDACTONE) 25 MG tablet TAKE 1 TABLET BY MOUTH EVERY DAY 12/05/23   Laurey Morale, MD  tiZANidine (ZANAFLEX) 4 MG tablet Take 4 mg by mouth 2 (two) times daily as needed for muscle spasms.    [provider]  traMADol (ULTRAM) 50 MG tablet Take 50 mg by mouth every evening.    [provider]    Family History Family History  Problem Relation Age of Onset   COPD Mother        smoked   Colon polyps Mother    Coronary artery disease Father        s/p CABG in his 26s   Hypertension Father    Heart attack Father    Diabetes Sister    Cirrhosis Sister    Colon cancer Maternal Uncle 33   Stroke Neg Hx    BRCA 1/2 Neg Hx    Breast cancer Neg Hx  Social History Social History   Tobacco Use   Smoking status: Former    Current packs/day: 0.00    Average packs/day: 2.0 packs/day for 35.0 years (70.0 ttl pk-yrs)    Types: Cigarettes    Start date: 11/06/1979    Quit date: 11/05/2014    Years since quitting: 9.2   Smokeless tobacco: Never  Substance Use Topics   Alcohol use: No    Alcohol/week: 0.0 standard drinks of alcohol   Drug use: No     Allergies   Prednisone   Review of Systems Review of Systems Per HPI  Physical Exam Triage Vital Signs ED Triage Vitals  Encounter Vitals Group     BP 02/06/24 1237 114/76     Systolic BP Percentile --      Diastolic BP Percentile --      Pulse Rate 02/06/24 1237 70     Resp 02/06/24 1237 16     Temp 02/06/24 1237 98.6 F (37 C)     Temp Source 02/06/24 1237 Oral     SpO2 02/06/24 1237 94 %     Weight --      Height --      Head Circumference --      Peak Flow --      Pain Score 02/06/24 1240 8     Pain Loc --      Pain Education --      Exclude from Growth Chart --    No data found.  Updated Vital Signs BP 114/76 (BP Location: Right Arm)   Pulse 70   Temp 98.6 F (37 C) (Oral)   Resp 16   SpO2 94%   Visual Acuity Right Eye Distance:   Left Eye Distance:   Bilateral Distance:    Right Eye Near:   Left Eye Near:    Bilateral Near:     Physical Exam Vitals and  nursing note reviewed.  Constitutional:      General: She is not in acute distress.    Appearance: Normal appearance.  HENT:     Head: Normocephalic.  Eyes:     Extraocular Movements: Extraocular movements intact.     Pupils: Pupils are equal, round, and reactive to light.  Pulmonary:     Effort: Pulmonary effort is normal.  Musculoskeletal:     Right wrist: Normal. No swelling, deformity or tenderness. Normal range of motion. Normal pulse.     Right hand: Swelling (dorsal aspect of right hand.) and tenderness (Dorsal aspect of right hand.) present. No deformity or lacerations. Normal range of motion. Normal strength. Normal capillary refill. Normal pulse.     Cervical back: Normal range of motion.  Skin:    General: Skin is warm and dry.  Neurological:     General: No focal deficit present.     Mental Status: She is alert and oriented to person, place, and time.  Psychiatric:        Mood and Affect: Mood normal.        Behavior: Behavior normal.      UC Treatments / Results  Labs (all labs ordered are listed, but only abnormal results are displayed) Labs Reviewed - No data to display  EKG   Radiology No results found.  Procedures Procedures (including critical care time)  Medications Ordered in UC Medications - No data to display  Initial Impression / Assessment and Plan / UC Course  I have reviewed the triage vital signs and the nursing notes.  Pertinent  labs & imaging results that were available during my care of the patient were reviewed by me and considered in my medical decision making (see chart for details).  X-ray of the right hand is pending.  Surprisingly, on exam, patient does not have any complaints of pain in the right wrist.  Ace wrap was provided to allow for additional compression and support.  Supportive care recommendations were provided and discussed with the patient to include over-the-counter Tylenol and RICE therapy.  Patient was advised to  follow-up with orthopedics if the x-ray result is negative and she continues to experience pain.  Patient was in agreement with this plan of care and verbalizes understanding.  All questions were answered.  Patient stable for discharge.  Final Clinical Impressions(s) / UC Diagnoses   Final diagnoses:  Right hand pain  Fall, initial encounter     Discharge Instructions      Go to [facility] for an x-ray of the right hand.  You will need to go to the main entrance of the hospital, then go to the radiology department.  You will be contacted once the results of the x-ray are received.  You will also have access to your results via MyChart. May take over-the-counter Tylenol as needed for pain or discomfort. RICE therapy, rest, ice, compression, and elevation.  Apply ice for 20 minutes, remove for 1 hour, repeat as needed to help with pain and swelling. An Ace wrap has been provided to allow for additional compression and support.  Wear the Ace wrap when you are engaged in prolonged or strenuous activity. As discussed, if the x-ray of the right hand is negative and you continue to experience symptoms after 3 to 4 weeks, recommend following up with orthopedics for further evaluation.  I do recommend that you see a hand specialist. Follow-up as needed.    ED Prescriptions   None    PDMP not reviewed this encounter.   Abran Cantor, NP 02/06/24 1337

## 2024-02-06 NOTE — Discharge Instructions (Addendum)
 Go to [facility] for an x-ray of the right hand.  You will need to go to the main entrance of the hospital, then go to the radiology department.  You will be contacted once the results of the x-ray are received.  You will also have access to your results via MyChart. May take over-the-counter Tylenol as needed for pain or discomfort. RICE therapy, rest, ice, compression, and elevation.  Apply ice for 20 minutes, remove for 1 hour, repeat as needed to help with pain and swelling. An Ace wrap has been provided to allow for additional compression and support.  Wear the Ace wrap when you are engaged in prolonged or strenuous activity. As discussed, if the x-ray of the right hand is negative and you continue to experience symptoms after 3 to 4 weeks, recommend following up with orthopedics for further evaluation.  I do recommend that you see a hand specialist. Follow-up as needed.

## 2024-02-11 DIAGNOSIS — I10 Essential (primary) hypertension: Secondary | ICD-10-CM | POA: Diagnosis not present

## 2024-02-11 DIAGNOSIS — E890 Postprocedural hypothyroidism: Secondary | ICD-10-CM | POA: Diagnosis not present

## 2024-02-11 DIAGNOSIS — M150 Primary generalized (osteo)arthritis: Secondary | ICD-10-CM | POA: Diagnosis not present

## 2024-02-11 DIAGNOSIS — E7800 Pure hypercholesterolemia, unspecified: Secondary | ICD-10-CM | POA: Diagnosis not present

## 2024-02-11 DIAGNOSIS — I428 Other cardiomyopathies: Secondary | ICD-10-CM | POA: Diagnosis not present

## 2024-02-11 DIAGNOSIS — E559 Vitamin D deficiency, unspecified: Secondary | ICD-10-CM | POA: Diagnosis not present

## 2024-02-11 DIAGNOSIS — M8589 Other specified disorders of bone density and structure, multiple sites: Secondary | ICD-10-CM | POA: Diagnosis not present

## 2024-02-11 DIAGNOSIS — J449 Chronic obstructive pulmonary disease, unspecified: Secondary | ICD-10-CM | POA: Diagnosis not present

## 2024-02-11 DIAGNOSIS — I509 Heart failure, unspecified: Secondary | ICD-10-CM | POA: Diagnosis not present

## 2024-02-11 DIAGNOSIS — M859 Disorder of bone density and structure, unspecified: Secondary | ICD-10-CM | POA: Diagnosis not present

## 2024-02-20 ENCOUNTER — Ambulatory Visit (INDEPENDENT_AMBULATORY_CARE_PROVIDER_SITE_OTHER): Payer: HMO

## 2024-02-20 DIAGNOSIS — I428 Other cardiomyopathies: Secondary | ICD-10-CM

## 2024-02-21 LAB — CUP PACEART REMOTE DEVICE CHECK
Battery Remaining Longevity: 118 mo
Battery Voltage: 3.08 V
Brady Statistic RV Percent Paced: 66.82 %
Date Time Interrogation Session: 20250417011129
HighPow Impedance: 63 Ohm
Implantable Lead Connection Status: 753985
Implantable Lead Connection Status: 753985
Implantable Lead Connection Status: 753985
Implantable Lead Implant Date: 20160610
Implantable Lead Implant Date: 20160610
Implantable Lead Implant Date: 20160610
Implantable Lead Location: 753858
Implantable Lead Location: 753859
Implantable Lead Location: 753860
Implantable Lead Model: 4396
Implantable Lead Model: 5076
Implantable Pulse Generator Implant Date: 20250128
Lead Channel Impedance Value: 437 Ohm
Lead Channel Impedance Value: 456 Ohm
Lead Channel Impedance Value: 456 Ohm
Lead Channel Impedance Value: 570 Ohm
Lead Channel Impedance Value: 627 Ohm
Lead Channel Impedance Value: 836 Ohm
Lead Channel Pacing Threshold Amplitude: 0.375 V
Lead Channel Pacing Threshold Amplitude: 1.125 V
Lead Channel Pacing Threshold Pulse Width: 0.4 ms
Lead Channel Pacing Threshold Pulse Width: 0.4 ms
Lead Channel Sensing Intrinsic Amplitude: 1 mV
Lead Channel Sensing Intrinsic Amplitude: 16.5 mV
Lead Channel Setting Pacing Amplitude: 2 V
Lead Channel Setting Pacing Amplitude: 2.25 V
Lead Channel Setting Pacing Amplitude: 3.5 V
Lead Channel Setting Pacing Pulse Width: 0.4 ms
Lead Channel Setting Pacing Pulse Width: 0.4 ms
Lead Channel Setting Sensing Sensitivity: 0.3 mV
Zone Setting Status: 755011
Zone Setting Status: 755011

## 2024-03-03 ENCOUNTER — Encounter: Payer: Self-pay | Admitting: Internal Medicine

## 2024-03-08 ENCOUNTER — Other Ambulatory Visit: Payer: Self-pay | Admitting: Cardiology

## 2024-03-09 ENCOUNTER — Encounter: Payer: HMO | Admitting: Internal Medicine

## 2024-03-11 ENCOUNTER — Encounter: Payer: Self-pay | Admitting: Internal Medicine

## 2024-03-11 ENCOUNTER — Ambulatory Visit: Payer: HMO | Attending: Internal Medicine | Admitting: Internal Medicine

## 2024-03-11 VITALS — BP 100/62 | HR 68 | Ht 62.0 in | Wt 171.0 lb

## 2024-03-11 DIAGNOSIS — I447 Left bundle-branch block, unspecified: Secondary | ICD-10-CM | POA: Diagnosis not present

## 2024-03-11 DIAGNOSIS — I5022 Chronic systolic (congestive) heart failure: Secondary | ICD-10-CM

## 2024-03-11 LAB — CUP PACEART INCLINIC DEVICE CHECK
Date Time Interrogation Session: 20250507112237
Implantable Lead Connection Status: 753985
Implantable Lead Connection Status: 753985
Implantable Lead Connection Status: 753985
Implantable Lead Implant Date: 20160610
Implantable Lead Implant Date: 20160610
Implantable Lead Implant Date: 20160610
Implantable Lead Location: 753858
Implantable Lead Location: 753859
Implantable Lead Location: 753860
Implantable Lead Model: 4396
Implantable Lead Model: 5076
Implantable Pulse Generator Implant Date: 20250128

## 2024-03-11 NOTE — Progress Notes (Signed)
 HPI Ms. Stacy Hernandez returns today for followup. She is a pleasant 68 yo woman with chronic systolic heart failure and LBBB who underwent biv ICD insertion over 8 years ago. She has reached ERI. She has had normalization of her LV function. No ICD therapies. She denies syncope. Her weight has not changed. She has undergone ICD gen change out and is doing well.  Allergies  Allergen Reactions   Prednisone  Nausea Only     Current Outpatient Medications  Medication Sig Dispense Refill   albuterol  (VENTOLIN  HFA) 108 (90 Base) MCG/ACT inhaler Inhale 2 puffs into the lungs every 4 (four) hours as needed.     alendronate (FOSAMAX) 70 MG tablet Take 70 mg by mouth every Sunday.     aspirin  81 MG tablet Take 81 mg by mouth at bedtime.      atorvastatin  (LIPITOR ) 10 MG tablet TAKE 1 TABLET BY MOUTH EVERYDAY AT BEDTIME 90 tablet 3   carvedilol  (COREG ) 12.5 MG tablet TAKE 1 TABLET BY MOUTH 2 TIMES DAILY. 180 tablet 3   cholecalciferol  (VITAMIN D3) 25 MCG (1000 UNIT) tablet Take 1,000 Units by mouth daily.     FARXIGA  10 MG TABS tablet Take 1 tablet (10 mg total) by mouth daily before breakfast. 90 tablet 3   gabapentin (NEURONTIN) 300 MG capsule Take 300 mg by mouth 2 (two) times daily.     Glucosamine-Chondroitin (OSTEO BI-FLEX REGULAR STRENGTH PO) Take 2 tablets by mouth daily.     levothyroxine  (SYNTHROID , LEVOTHROID) 100 MCG tablet Take 100 mcg by mouth daily before breakfast.     Multiple Vitamins-Minerals (MULTIVITAMIN WITH MINERALS) tablet Take 1 tablet by mouth daily.     niacin  500 MG tablet Take 1,500 mg by mouth at bedtime.     nitroGLYCERIN  (NITROSTAT ) 0.4 MG SL tablet Place 1 tablet (0.4 mg total) under the tongue every 5 (five) minutes as needed for chest pain. 25 tablet 3   sacubitril -valsartan  (ENTRESTO ) 97-103 MG Take 1 tablet by mouth 2 (two) times daily. 180 tablet 3   spironolactone  (ALDACTONE ) 25 MG tablet TAKE 1 TABLET BY MOUTH EVERY DAY 90 tablet 3   tiZANidine  (ZANAFLEX ) 4 MG  tablet Take 4 mg by mouth 2 (two) times daily as needed for muscle spasms.     traMADol (ULTRAM) 50 MG tablet Take 50 mg by mouth every evening.     No current facility-administered medications for this visit.     Past Medical History:  Diagnosis Date   AICD (automatic cardioverter/defibrillator) present 04/15/2015   Medtronic (serial  Number [id number]) biventricular ICD   Back pain    Recent positive ANA but told she has OA   CAD (coronary artery disease)    a.  per cath in June 2013 with nonobstructive CAD;  b.  Lexiscan  Myoview (12/15):  No ischemia.  Anteroseptal and apical fixed defect - 2/2 IVCD of LBBB type vs scar, EF 25%; Intermediate Risk   Cardiomyopathy (HCC)    a.  EF 45-50% by echo 04/23/2012;  b.  Echo (12/15):  EF 10-15%, Gr 2 DD, ventricular septum dyssynergy, mod MR, mod LAE.   CHF (congestive heart failure) (HCC)    COPD (chronic obstructive pulmonary disease) (HCC)    Hypercalcemia    Due to HCTZ, improved with d/c of calcium  supplements   Hypertension    Hypertriglyceridemia    Nephrolithiasis    OA (osteoarthritis)    Osteoporosis    Sleep apnea    Thyroid  disease  Post-ablation hypothyroidism   Tobacco abuse     ROS:   All systems reviewed and negative except as noted in the HPI.   Past Surgical History:  Procedure Laterality Date   BIOPSY  08/08/2018   Procedure: BIOPSY;  Surgeon: Alvis Jourdain, MD;  Location: [facility];  Service: Endoscopy;;   BLADDER SURGERY     bladder & bowel tac with mesh   CARPAL TUNNEL RELEASE     CESAREAN SECTION     EP IMPLANTABLE DEVICE N/A 04/15/2015   Procedure: BiV ICD Insertion CRT-D;  Surgeon: Tammie Fall, MD;  Medtronic (serial  Number [id number]) biventricular ICD   ESOPHAGOGASTRODUODENOSCOPY (EGD) WITH PROPOFOL  N/A 08/08/2018   Procedure: ESOPHAGOGASTRODUODENOSCOPY (EGD) WITH PROPOFOL ;  Surgeon: Alvis Jourdain, MD;  Location: [facility];  Service: Endoscopy;  Laterality: N/A;   ICD GENERATOR CHANGEOUT  N/A 12/03/2023   Procedure: ICD GENERATOR CHANGEOUT;  Surgeon: Tammie Fall, MD;  Location[facility] INVASIVE CV LAB;  Service: Cardiovascular;  Laterality: N/A;   LEFT AND RIGHT HEART CATHETERIZATION WITH CORONARY ANGIOGRAM N/A 11/12/2014   Procedure: LEFT AND RIGHT HEART CATHETERIZATION WITH CORONARY ANGIOGRAM;  Surgeon: Darlis Eisenmenger, MD;  Location: [facility] CATH LAB;  Service: Cardiovascular;  Laterality: N/A;   LEFT HEART CATHETERIZATION WITH CORONARY ANGIOGRAM N/A 04/22/2012   Procedure: LEFT HEART CATHETERIZATION WITH CORONARY ANGIOGRAM;  Surgeon: Kristopher Pheasant, MD;  Location: [facility] CATH LAB;  Service: Cardiovascular;  Laterality: N/A;   PARTIAL HYSTERECTOMY     Due to cervical dysplasia   SAVORY DILATION N/A 08/08/2018   Procedure: SAVORY DILATION;  Surgeon: Alvis Jourdain, MD;  Location: [facility];  Service: Endoscopy;  Laterality: N/A;   TONSILLECTOMY       Family History  Problem Relation Age of Onset   COPD Mother        smoked   Colon polyps Mother    Coronary artery disease Father        s/p CABG in his 69s   Hypertension Father    Heart attack Father    Diabetes Sister    Cirrhosis Sister    Colon cancer Maternal Uncle 55   Stroke Neg Hx    BRCA 1/2 Neg Hx    Breast cancer Neg Hx      Social History   Socioeconomic History   Marital status: Married    Spouse name: Not on file   Number of children: Not on file   Years of education: Not on file   Highest education level: Not on file  Occupational History   Not on file  Tobacco Use   Smoking status: Former    Current packs/day: 0.00    Average packs/day: 2.0 packs/day for 35.0 years (70.0 ttl pk-yrs)    Types: Cigarettes    Start date: 11/06/1979    Quit date: 11/05/2014    Years since quitting: 9.3   Smokeless tobacco: Never  Substance and Sexual Activity   Alcohol use: No    Alcohol/week: 0.0 standard drinks of alcohol   Drug use: No   Sexual activity: Not Currently  Other Topics Concern   Not on file  Social  History Narrative   Not on file   Social Drivers of Health   Financial Resource Strain: Not on file  Food Insecurity: Not on file  Transportation Needs: Not on file  Physical Activity: Not on file  Stress: Not on file  Social Connections: Unknown (03/20/2022)   Received from [facility]   Social Network  Social Network: Not on file  Intimate Partner Violence: Unknown (02/09/2022)   [facility] from [facility], [facility]   HITS    Physically Hurt: Not on file    Insult or Talk Down To: Not on file    Threaten Physical Harm: Not on file    Scream or Curse: Not on file     BP 100/62   Pulse 68   Ht 5\' 2"  (1.575 m)   Wt 171 lb (77.6 kg)   SpO2 97%   BMI 31.28 kg/m   Physical Exam:  Well appearing NAD HEENT: Unremarkable Neck:  No JVD, no thyromegally Lymphatics:  No adenopathy Back:  No CVA tenderness Lungs:  Clear with no wheezes HEART:  Regular rate rhythm, no murmurs, no rubs, no clicks Abd:  soft, positive bowel sounds, no organomegally, no rebound, no guarding Ext:  2 plus pulses, no edema, no cyanosis, no clubbing Skin:  No rashes no nodules Neuro:  CN II through XII intact, motor grossly intact  EKG - NSR with biv pacing  DEVICE  Normal device function.  See PaceArt for details.   Assess/Plan:  Chronic systolic heart failure - she has had normalization of her LV function and she will continue her GDMT. ICD - she is s/p ICD gen change out. No change.  Atrial threshold - her atrial threshold is chronically elevated. She paces 15%. We will reprogram the device. No plans for extraction at this time.  Obesity - she is encouraged to lose weight.    Stacy Hernandez Stacy Ellwanger,MD

## 2024-03-11 NOTE — Patient Instructions (Signed)

## 2024-03-16 ENCOUNTER — Telehealth (HOSPITAL_COMMUNITY): Payer: Self-pay

## 2024-03-16 NOTE — Progress Notes (Signed)
 Date:  03/17/2024   ID:  Stacy Hernandez, DOB September 24, 1956, MRN [id number]   Provider location: [place] [facility] Type of Visit: Established patient  PCP:  Imelda Man, MD  Cardiologist: Dr. Mitzie Anda   HPI: Stacy Hernandez is a 68 y.o. female who has a history of HTN, intermittent LBBB, and nonischemic cardiomyopathy.  She was admitted with chest pain in 6/13.  ECG showed intermittent LBBB.  Echo showed EF 45-50%.  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     Developed CP and dyspnea. She was set up for echo and Cardiolite  in 12/15.  Echo showed EF 10-15% (significant fall).  She had a Lexiscan  Cardiolite  with EF 25% and apical anteroseptal and apical fixed perfusion defect.  Therefore, she was sent for left and right heart catheterization in 1/16.  This showed mild elevation in LV and RV filling pressures and no significant CAD.  She was started on Lasix  20 mg daily.  Echo in 5/16 showed EF 20-25%, mildly dilated and mildly dysfunctional RV.  PFTs 4/16 showed severe obstruction and restriction.  She has seen Dr Waymond Hailey.  She is noted to have an elevated right hemidiaphragm.    In 6/16, she had Medtronic CRT-D system placed.  In 7/16, CPX showed low normal functional capacity with the main limitation being pulmonary.  Repeat echo in 11/16 showed improvement in EF to 40-45%.  Echo in 7/18 showed stable EF 40-45%.  Echo in 7/19 showed EF up to 50-55%.  Echo in 10/20 showed EF 50-55% with mildly decreased RV systolic function.  Echo in 6/22 showed EF 56%, normal RV.   Echo 11/24 EF appears to be about 50% with mild RV dysfunction, mild MR, normal IVC (no official read yet).   S/p Gen change 4/25.   Today she returns for HF follow up. Overall feeling fine. She is not SOB walking up steps. Main limiter is arthritis. Denies palpitations, abnormal bleeding, CP, dizziness, edema, or PND/Orthopnea. Appetite ok. Weight at home 170 pounds. Taking all medications.   Device  interrogation (personally reviewed): OptiVol just above reference line, thoracic impedence down,5.7 hr/day activity, no AF or VT, 98% VP  ECG (personally reviewed): none ordered today.   Labs (1/16): SPEP no M spike  Labs (10/24): K 3.9, creatinine 0.93, LDL 67, hgb 13.1 Labs (12/24): K 4.1, creatinine 0.79   PMH: 1. HTN 2. Hyperlipidemia 3. COPD: Prior smoker.  PFTs (4/16) with FVC 37%, FEV1 36%, ratio 97%, TLC 67%, DLCO 32% => severe obstruction and restriction.  COPD + elevated right hemidiaphragm.  4. Intermittent LBBB/IVCD 5. Hypothyroidism 6. Cardiomyopathy: Nonischemic.  Echo (6/13) with EF 45-50%, paradoxical septal motion.  LHC (6/13) with 40% D1 stenosis, 30% PLV stenosis.  Echo (12/15) with EF 10-15%, moderately dilated LV, diffuse hypokinesis, moderate MR. Lexiscan  Cardiolite  (12/15) with EF 25%, diffuse hypokinesis, apical anteroseptal and apical fixed defect. LHC/RHC (1/16) with mean RA 8, PA 37/19, mean PCWP 19, CI 2.63, EF 30%, no significant CAD.  Cardiac MRI 12/20/2014: EF 24% Mod mitral regurg thought to be from dilated LV, normal RV size, no LGE.  Echo (5/16) with EF 20-25%, mildly dilated and mildly dysfunctional RV, mild MR. Medtronic CRT-D placed 6/16.  CPX (7/16) with peak VO2 16.3, VE/VCO2 slope 25, RER 1.1, low normal functional capacity limited primarily by lung disease.  - Echo (11/16) with EF 40-45%, mild MR.  - Echo (7/18): EF 40-45%, diffuse hypokinesis, grade II diastolic dysfunction.  - Echo (7/19):  EF 50-55%, mild MR, PASP 37 mmHg.  - Echo (10/20): EF 50-55%, septal bounce, mildly decreased RV systolic function, PASP 26 mmHg.  - Echo (6/22): EF 56%, normal RV.  - Echo (11/24): EF 50%, mild RV dysfunction, mild MR, IVC normal 7. 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  8. OSA: 04/18/2015 Sleep Study. She has not been able to tolerate CPAP. 9. ABIs (12/18): Normal  Current Outpatient Medications  Medication Sig Dispense Refill    albuterol  (VENTOLIN  HFA) 108 (90 Base) MCG/ACT inhaler Inhale 2 puffs into the lungs every 4 (four) hours as needed.     alendronate (FOSAMAX) 70 MG tablet Take 70 mg by mouth every Sunday.     aspirin  81 MG tablet Take 81 mg by mouth at bedtime.      atorvastatin  (LIPITOR ) 10 MG tablet TAKE 1 TABLET BY MOUTH EVERYDAY AT BEDTIME 90 tablet 3   carvedilol  (COREG ) 12.5 MG tablet TAKE 1 TABLET BY MOUTH 2 TIMES DAILY. 180 tablet 3   cholecalciferol  (VITAMIN D3) 25 MCG (1000 UNIT) tablet Take 1,000 Units by mouth daily.     FARXIGA  10 MG TABS tablet Take 1 tablet (10 mg total) by mouth daily before breakfast. 90 tablet 3   gabapentin (NEURONTIN) 300 MG capsule Take 300 mg by mouth 2 (two) times daily.     Glucosamine-Chondroitin (OSTEO BI-FLEX REGULAR STRENGTH PO) Take 2 tablets by mouth daily.     levothyroxine  (SYNTHROID , LEVOTHROID) 100 MCG tablet Take 100 mcg by mouth daily before breakfast.     Multiple Vitamins-Minerals (MULTIVITAMIN WITH MINERALS) tablet Take 1 tablet by mouth daily.     niacin  500 MG tablet Take 1,500 mg by mouth at bedtime.     nitroGLYCERIN  (NITROSTAT ) 0.4 MG SL tablet Place 1 tablet (0.4 mg total) under the tongue every 5 (five) minutes as needed for chest pain. 25 tablet 3   sacubitril -valsartan  (ENTRESTO ) 97-103 MG Take 1 tablet by mouth 2 (two) times daily. 180 tablet 3   spironolactone  (ALDACTONE ) 25 MG tablet TAKE 1 TABLET BY MOUTH EVERY DAY 90 tablet 3   tiZANidine  (ZANAFLEX ) 4 MG tablet Take 4 mg by mouth 2 (two) times daily as needed for muscle spasms.     traMADol (ULTRAM) 50 MG tablet Take 50 mg by mouth every evening.     No current facility-administered medications for this encounter.   Allergies:   Prednisone    Social History:  The patient  reports that she quit smoking about 9 years ago. Her smoking use included cigarettes. She started smoking about 44 years ago. She has a 70 pack-year smoking history. She has never used smokeless tobacco. She reports that  she does not drink alcohol and does not use drugs.   Family History:  The patient's family history includes COPD in her mother; Cirrhosis in her sister; Colon cancer (age of onset: 43) in her maternal uncle; Colon polyps in her mother; Coronary artery disease in her father; Diabetes in her sister; Heart attack in her father; Hypertension in her father.   ROS:  Please see the history of present illness.   All other systems are personally reviewed and negative.   Wt Readings from Last 3 Encounters:  03/17/24 77.1 kg (170 lb)  03/11/24 77.6 kg (171 lb)  12/03/23 76.7 kg (169 lb)   BP 120/80   Pulse 66   Wt 77.1 kg (170 lb)   SpO2 95%   BMI 31.09 kg/m   Physical Exam:   General:  NAD. No resp difficulty, walked into clinic HEENT: Normal Neck: Supple. No JVD. Cor: Regular rate & rhythm. No rubs, gallops or murmurs. Lungs: Clear Abdomen: Soft, nontender, nondistended.  Extremities: No cyanosis, clubbing, rash, edema Neuro: Alert & oriented x 3, moves all 4 extremities w/o difficulty. Affect pleasant.   ASSESSMENT AND PLAN: 1. Cardiomyopathy: Nonischemic cardiomyopathy. In the past, she had EF 45-50% with no significant CAD (2013).  LBBB-related nonischemic cardiomyopathy.  However, in the fall of 2015 she developed exertional chest pain and dyspnea.  Echo and Cardiolite  showed marked worsening of her LV systolic function (EF 10-15% by echo) and Cardiolite  suggested possible prior MI.  Left heart cath in 1/16 showed no significant CAD.  RHC showed mildly elevated right and left heart filling pressures and normal CI. SPEP negative and no infiltrative disease on cardiac MRI. Repeat echo in 5/16 with EF 20-25%.  CPX showed limitations are related to lung disease as well as obesity, no HF limitation. Medtronic CRT-D placement.  Followup echo in 11/16 showed improvement in EF to 40-45%.  Echo in 10/20 showed EF 50-55%.  Echo in 6/22 with EF up to 56%. Echo 11/24 on Dr Charline Contras read showed 50% with  mild RV dysfunction, mild MR, normal IVC.  NYHA I-II, she is not volume overloaded on exam, OptiVOl shows mildly fluid up. - Start Lasix  20 mg PRN. BMET and BNP today.  - Continue Coreg  12.5 mg bid.   - Continue Entresto  97/103 bid.   - Continue spironolactone  25 mg daily.   - Continue Farxiga  10 mg daily. No GU symptoms - She will need BMET every 3 months to follow K and creatinine given her medication regimen.   2. Elevated right hemidiaphragm: Chronic issue.  - No change 3. OSA:  Has not been able to tolerate CPAP.    - No change. 4. COPD: Severe by PFTs. No longer smokes.  5. Hyperlipidemia: Continue atorvastatin .  PCP follows lipids  Follow up in 6 months with Dr. Mitzie Anda    Signed, Elmarie Hacking, FNP  03/17/2024  [facility] [address] [facility] [place] [address] [phone] (office) [phone] (fax)

## 2024-03-16 NOTE — Telephone Encounter (Signed)
 Called to confirm/remind patient of their appointment at the [facility] on 03/17/2024.   Appointment:   [x] Confirmed Patient reminded to bring all medications and/or complete list.  Confirmed patient has transportation. Gave directions, instructed to utilize valet parking.

## 2024-03-17 ENCOUNTER — Ambulatory Visit (HOSPITAL_COMMUNITY)
Admission: RE | Admit: 2024-03-17 | Discharge: 2024-03-17 | Disposition: A | Discharge status: Home / Self Care | Payer: Medicare HMO | Source: Ambulatory Visit | Attending: Family Medicine | Admitting: Family Medicine

## 2024-03-17 ENCOUNTER — Ambulatory Visit (HOSPITAL_COMMUNITY): Payer: Self-pay | Admitting: Family Medicine

## 2024-03-17 ENCOUNTER — Encounter (HOSPITAL_COMMUNITY): Payer: Self-pay

## 2024-03-17 VITALS — BP 120/80 | HR 66 | Wt 170.0 lb

## 2024-03-17 DIAGNOSIS — I447 Left bundle-branch block, unspecified: Secondary | ICD-10-CM | POA: Diagnosis not present

## 2024-03-17 DIAGNOSIS — Z79899 Other long term (current) drug therapy: Secondary | ICD-10-CM | POA: Diagnosis not present

## 2024-03-17 DIAGNOSIS — E669 Obesity, unspecified: Secondary | ICD-10-CM | POA: Insufficient documentation

## 2024-03-17 DIAGNOSIS — Z7982 Long term (current) use of aspirin: Secondary | ICD-10-CM | POA: Insufficient documentation

## 2024-03-17 DIAGNOSIS — I252 Old myocardial infarction: Secondary | ICD-10-CM | POA: Insufficient documentation

## 2024-03-17 DIAGNOSIS — Z7984 Long term (current) use of oral hypoglycemic drugs: Secondary | ICD-10-CM | POA: Insufficient documentation

## 2024-03-17 DIAGNOSIS — Z79890 Hormone replacement therapy (postmenopausal): Secondary | ICD-10-CM | POA: Diagnosis not present

## 2024-03-17 DIAGNOSIS — I5022 Chronic systolic (congestive) heart failure: Secondary | ICD-10-CM

## 2024-03-17 DIAGNOSIS — G4733 Obstructive sleep apnea (adult) (pediatric): Secondary | ICD-10-CM | POA: Insufficient documentation

## 2024-03-17 DIAGNOSIS — I2510 Atherosclerotic heart disease of native coronary artery without angina pectoris: Secondary | ICD-10-CM | POA: Diagnosis not present

## 2024-03-17 DIAGNOSIS — R079 Chest pain, unspecified: Secondary | ICD-10-CM | POA: Diagnosis not present

## 2024-03-17 DIAGNOSIS — Z87891 Personal history of nicotine dependence: Secondary | ICD-10-CM | POA: Insufficient documentation

## 2024-03-17 DIAGNOSIS — I428 Other cardiomyopathies: Secondary | ICD-10-CM | POA: Insufficient documentation

## 2024-03-17 DIAGNOSIS — J449 Chronic obstructive pulmonary disease, unspecified: Secondary | ICD-10-CM | POA: Diagnosis not present

## 2024-03-17 DIAGNOSIS — E785 Hyperlipidemia, unspecified: Secondary | ICD-10-CM | POA: Diagnosis not present

## 2024-03-17 LAB — BASIC METABOLIC PANEL WITH GFR
Anion gap: 6 (ref 5–15)
BUN: 14 mg/dL (ref 8–23)
CO2: 27 mmol/L (ref 22–32)
Calcium: 9.5 mg/dL (ref 8.9–10.3)
Chloride: 106 mmol/L (ref 98–111)
Creatinine, Ser: 0.85 mg/dL (ref 0.44–1.00)
GFR, Estimated: >60 mL/min (ref >60)
Glucose, Bld: 87 mg/dL (ref 70–99)
Potassium: 4.3 mmol/L (ref 3.5–5.1)
Sodium: 139 mmol/L (ref 135–145)

## 2024-03-17 LAB — BRAIN NATRIURETIC PEPTIDE: B Natriuretic Peptide: 123.2 pg/mL — ABNORMAL HIGH (ref 0.0–100.0)

## 2024-03-17 MED ORDER — FUROSEMIDE 40 MG PO TABS: 20.0000 mg | ORAL_TABLET | ORAL | 3 refills | Status: DC | PRN

## 2024-03-17 NOTE — Patient Instructions (Addendum)
 Good to see you today!  Take Lasix  20 mg(1/2 tablet)as needed if fluid gain of 3 lbs overnight 5 lbs in a week   Labs done today, your results will be available in MyChart, we will contact you for abnormal readings.  Follow up lab work in 3 months  Your physician recommends that you schedule a follow-up appointment :6 months(November) Call office in September to schedule an appointment for Dr. Mitzie Anda  If you have any questions or concerns before your next appointment please send us  a message through [facility] or call our office at [phone].    TO LEAVE A MESSAGE FOR THE NURSE SELECT OPTION 2, PLEASE LEAVE A MESSAGE INCLUDING: YOUR NAME DATE OF BIRTH CALL BACK NUMBER REASON FOR CALL**this is important as we prioritize the call backs  YOU WILL RECEIVE A CALL BACK THE SAME DAY AS LONG AS YOU CALL BEFORE 4:00 PM At the [facility], you and your health needs are our priority. As part of our continuing mission to provide you with exceptional heart care, we have created designated Provider Care Teams. These Care Teams include your primary Cardiologist (physician) and Advanced Practice Providers (APPs- Physician Assistants and Nurse Practitioners) who all work together to provide you with the care you need, when you need it.   You may see any of the following providers on your designated Care Team at your next follow up: Dr Jules Oar Dr Peder Bourdon Dr. Alwin Baars Dr. Arta Lark Amy Marijane Shoulders, NP Ruddy Corral, [place] [facility] [place], [place] Dennise Fitz, NP [place] Lee, NP Shawnee Dellen, NP Luster Salters, PharmD Bevely Brush, PharmD   Please be sure to bring in all your medications bottles to every appointment.    Thank you for choosing [facility]

## 2024-04-01 NOTE — Addendum Note (Signed)
 Addended by: Lott Rouleau A on: 04/01/2024 09:53 AM   Modules accepted: Orders

## 2024-04-01 NOTE — Progress Notes (Signed)
 Remote ICD transmission.

## 2024-04-17 ENCOUNTER — Encounter: Payer: Self-pay | Admitting: Family Medicine

## 2024-04-27 ENCOUNTER — Other Ambulatory Visit (HOSPITAL_COMMUNITY): Payer: Self-pay | Admitting: Cardiology

## 2024-04-28 DIAGNOSIS — B078 Other viral warts: Secondary | ICD-10-CM | POA: Diagnosis not present

## 2024-04-28 DIAGNOSIS — L820 Inflamed seborrheic keratosis: Secondary | ICD-10-CM | POA: Diagnosis not present

## 2024-05-14 ENCOUNTER — Telehealth: Payer: Self-pay | Admitting: *Deleted

## 2024-05-21 ENCOUNTER — Ambulatory Visit: Payer: HMO

## 2024-05-21 DIAGNOSIS — I428 Other cardiomyopathies: Secondary | ICD-10-CM | POA: Diagnosis not present

## 2024-05-22 LAB — CUP PACEART REMOTE DEVICE CHECK
Battery Remaining Longevity: 115 mo
Battery Voltage: 3.03 V
Brady Statistic AP VP Percent: 0.73 %
Brady Statistic AP VS Percent: 0.06 %
Brady Statistic AS VP Percent: 97.24 %
Brady Statistic AS VS Percent: 1.97 %
Brady Statistic RA Percent Paced: 0.82 %
Brady Statistic RV Percent Paced: 61.01 %
Date Time Interrogation Session: 20250716213824
HighPow Impedance: 76 Ohm
Implantable Lead Connection Status: 753985
Implantable Lead Connection Status: 753985
Implantable Lead Connection Status: 753985
Implantable Lead Implant Date: 20160610
Implantable Lead Implant Date: 20160610
Implantable Lead Implant Date: 20160610
Implantable Lead Location: 753858
Implantable Lead Location: 753859
Implantable Lead Location: 753860
Implantable Lead Model: 4396
Implantable Lead Model: 5076
Implantable Pulse Generator Implant Date: 20250128
Lead Channel Impedance Value: 380 Ohm
Lead Channel Impedance Value: 456 Ohm
Lead Channel Impedance Value: 494 Ohm
Lead Channel Impedance Value: 551 Ohm
Lead Channel Impedance Value: 627 Ohm
Lead Channel Impedance Value: 741 Ohm
Lead Channel Pacing Threshold Amplitude: 0.375 V
Lead Channel Pacing Threshold Amplitude: 1.25 V
Lead Channel Pacing Threshold Pulse Width: 0.4 ms
Lead Channel Pacing Threshold Pulse Width: 0.4 ms
Lead Channel Sensing Intrinsic Amplitude: 1 mV
Lead Channel Sensing Intrinsic Amplitude: 13.5 mV
Lead Channel Setting Pacing Amplitude: 2 V
Lead Channel Setting Pacing Amplitude: 2.25 V
Lead Channel Setting Pacing Amplitude: 3.5 V
Lead Channel Setting Pacing Pulse Width: 0.4 ms
Lead Channel Setting Pacing Pulse Width: 0.4 ms
Lead Channel Setting Sensing Sensitivity: 0.3 mV
Zone Setting Status: 755011
Zone Setting Status: 755011

## 2024-05-25 ENCOUNTER — Encounter: Payer: Self-pay | Admitting: Internal Medicine

## 2024-05-25 ENCOUNTER — Ambulatory Visit: Payer: Self-pay | Admitting: Internal Medicine

## 2024-05-25 ENCOUNTER — Ambulatory Visit (AMBULATORY_SURGERY_CENTER)

## 2024-05-25 VITALS — Ht 62.0 in | Wt 170.0 lb

## 2024-05-25 DIAGNOSIS — Z1211 Encounter for screening for malignant neoplasm of colon: Secondary | ICD-10-CM

## 2024-05-25 MED ORDER — NA SULFATE-K SULFATE-MG SULF 17.5-3.13-1.6 GM/177ML PO SOLN: 1.0000 | Freq: Once | ORAL | 0 refills | Status: DC

## 2024-05-25 NOTE — Progress Notes (Signed)
 No egg or soy allergy known to patient  No issues known to pt with past sedation with any surgeries or procedures Patient denies ever being told they had issues or difficulty with intubation  No FH of Malignant Hyperthermia Pt is not on diet pills Pt is not on  home 02  Pt is not on blood thinners  Pt has intermittent issues with constipation, takes no meds  No A fib or A flutter Have any cardiac testing pending--No, just routine appointments Pt can ambulate  Pt denies use of chewing tobacco Discussed diabetic I weight loss medication holds Discussed NSAID holds Checked BMI Pt instructed to use Singlecare.com or GoodRx for a price reduction on prep  Patient's chart reviewed by Norleen Schillings CNRA prior to previsit and patient appropriate for the [facility].  Pre visit completed and red dot placed by patient's name on their procedure day (on provider's schedule).

## 2024-06-05 ENCOUNTER — Encounter: Payer: Self-pay | Admitting: Internal Medicine

## 2024-06-05 ENCOUNTER — Ambulatory Visit: Admitting: Internal Medicine

## 2024-06-05 VITALS — BP 100/64 | HR 58 | Temp 96.8°F | Resp 18 | Ht 62.0 in | Wt 170.0 lb

## 2024-06-05 DIAGNOSIS — K5730 Diverticulosis of large intestine without perforation or abscess without bleeding: Secondary | ICD-10-CM

## 2024-06-05 DIAGNOSIS — I10 Essential (primary) hypertension: Secondary | ICD-10-CM | POA: Diagnosis not present

## 2024-06-05 DIAGNOSIS — I2510 Atherosclerotic heart disease of native coronary artery without angina pectoris: Secondary | ICD-10-CM | POA: Diagnosis not present

## 2024-06-05 DIAGNOSIS — Z860101 Personal history of adenomatous and serrated colon polyps: Secondary | ICD-10-CM | POA: Diagnosis not present

## 2024-06-05 DIAGNOSIS — D123 Benign neoplasm of transverse colon: Secondary | ICD-10-CM | POA: Diagnosis not present

## 2024-06-05 DIAGNOSIS — Z1211 Encounter for screening for malignant neoplasm of colon: Secondary | ICD-10-CM | POA: Diagnosis not present

## 2024-06-05 DIAGNOSIS — I509 Heart failure, unspecified: Secondary | ICD-10-CM | POA: Diagnosis not present

## 2024-06-05 DIAGNOSIS — Z860100 Personal history of colon polyps, unspecified: Secondary | ICD-10-CM

## 2024-06-05 DIAGNOSIS — J449 Chronic obstructive pulmonary disease, unspecified: Secondary | ICD-10-CM | POA: Diagnosis not present

## 2024-06-05 MED ORDER — SODIUM CHLORIDE 0.9 % IV SOLN: 500.0000 mL | Freq: Once | INTRAVENOUS | Status: DC

## 2024-06-05 NOTE — Op Note (Signed)
 [facility] Patient Name: Stacy Hernandez Procedure Date: 06/05/2024 11:06 AM MRN: [id number] Endoscopist: Norleen SAILOR. Abran , MD, [id number] Age: 68 Referring MD:  Date of Birth: 1956-04-15 Gender: Female Account #: [id number] Procedure:                Colonoscopy with cold snare polypectomy x 1 Indications:              High risk colon cancer surveillance: Personal                            history of non-advanced adenoma, High risk colon                            cancer surveillance: Personal history of sessile                            serrated colon polyp (less than 10 mm in size) with                            no dysplasia. Previous examinations 2005, 2010, 2015 Medicines:                Monitored Anesthesia Care Procedure:                Pre-Anesthesia Assessment:                           - Prior to the procedure, a History and Physical                            was performed, and patient medications and                            allergies were reviewed. The patient's tolerance of                            previous anesthesia was also reviewed. The risks                            and benefits of the procedure and the sedation                            options and risks were discussed with the patient.                            All questions were answered, and informed consent                            was obtained. Prior Anticoagulants: The patient has                            taken no anticoagulant or antiplatelet agents. ASA                            Grade Assessment: III - A patient with severe  systemic disease. After reviewing the risks and                            benefits, the patient was deemed in satisfactory                            condition to undergo the procedure.                           After obtaining informed consent, the colonoscope                            was passed under direct vision. Throughout the                             procedure, the patient's blood pressure, pulse, and                            oxygen saturations were monitored continuously. The                            CF [id number] #[id number] was introduced through the anus                            and advanced to the the cecum, identified by                            appendiceal orifice and ileocecal valve. The                            ileocecal valve, appendiceal orifice, and rectum                            were photographed. The quality of the bowel                            preparation was excellent. The colonoscopy was                            performed without difficulty. The patient tolerated                            the procedure well. The bowel preparation used was                            SUPREP via split dose instruction. Scope In: 11:14:50 AM Scope Out: 11:31:17 AM Scope Withdrawal Time: 0 hours 13 minutes 7 seconds  Total Procedure Duration: 0 hours 16 minutes 27 seconds  Findings:                 A 3 mm polyp was found in the transverse colon. The                            polyp was sessile. The polyp was removed  with a                            cold snare. Resection and retrieval were complete.                           Multiple diverticula were found in the sigmoid                            colon.                           The exam was otherwise without abnormality on                            direct and retroflexion views. Complications:            No immediate complications. Estimated blood loss:                            None. Estimated Blood Loss:     Estimated blood loss: none. Impression:               - One 3 mm polyp in the transverse colon, removed                            with a cold snare. Resected and retrieved.                           - Diverticulosis in the sigmoid colon.                           - The examination was otherwise normal on direct                            and  retroflexion views. Recommendation:           - Repeat colonoscopy in 7-10 years for surveillance.                           - Patient has a contact number available for                            emergencies. The signs and symptoms of potential                            delayed complications were discussed with the                            patient. Return to normal activities tomorrow.                            Written discharge instructions were provided to the                            patient.                           -  Resume previous diet.                           - Continue present medications.                           - Await pathology results. Norleen SAILOR. Abran, MD 06/05/2024 11:40:07 AM This report has been signed electronically.

## 2024-06-05 NOTE — Progress Notes (Signed)
 Called to room to assist during endoscopic procedure.  Patient ID and intended procedure confirmed with present staff. Received instructions for my participation in the procedure from the performing physician.

## 2024-06-05 NOTE — Patient Instructions (Addendum)
 Handouts Provided:  Polyps and Diverticulosis  YOU HAD AN ENDOSCOPIC PROCEDURE TODAY AT THE [facility]:   Refer to the procedure report that was given to you for any specific questions about what was found during the examination.  If the procedure report does not answer your questions, please call your gastroenterologist to clarify.  If you requested that your care partner not be given the details of your procedure findings, then the procedure report has been included in a sealed envelope for you to review at your convenience later.  YOU SHOULD EXPECT: Some feelings of bloating in the abdomen. Passage of more gas than usual.  Walking can help get rid of the air that was put into your GI tract during the procedure and reduce the bloating. If you had a lower endoscopy (such as a colonoscopy or flexible sigmoidoscopy) you may notice spotting of blood in your stool or on the toilet paper. If you underwent a bowel prep for your procedure, you may not have a normal bowel movement for a few days.  Please Note:  You might notice some irritation and congestion in your nose or some drainage.  This is from the oxygen used during your procedure.  There is no need for concern and it should clear up in a day or so.  SYMPTOMS TO REPORT IMMEDIATELY:  Following lower endoscopy (colonoscopy or flexible sigmoidoscopy):  Excessive amounts of blood in the stool  Significant tenderness or worsening of abdominal pains  Swelling of the abdomen that is new, acute  Fever of 100F or higher  For urgent or emergent issues, a gastroenterologist can be reached at any hour by calling [phone]. Do not use MyChart messaging for urgent concerns.    DIET:  We do recommend a small meal at first, but then you may proceed to your regular diet.  Drink plenty of fluids but you should avoid alcoholic beverages for 24 hours.  ACTIVITY:  You should plan to take it easy for the rest of today and you should NOT DRIVE  or use heavy machinery until tomorrow (because of the sedation medicines used during the test).    FOLLOW UP: Our staff will call the number listed on your records the next business day following your procedure.  We will call around 7:15- 8:00 am to check on you and address any questions or concerns that you may have regarding the information given to you following your procedure. If we do not reach you, we will leave a message.     If any biopsies were taken you will be contacted by phone or by letter within the next 1-3 weeks.  Please call us at [phone] if you have not heard about the biopsies in 3 weeks.    SIGNATURES/CONFIDENTIALITY: You and/or your care partner have signed paperwork which will be entered into your electronic medical record.  These signatures attest to the fact that that the information above on your After Visit Summary has been reviewed and is understood.  Full responsibility of the confidentiality of this discharge information lies with you and/or your care-partner.

## 2024-06-05 NOTE — Progress Notes (Signed)
 Pt sedate, gd SR's, VSS, report to RN

## 2024-06-05 NOTE — Progress Notes (Signed)
 HISTORY OF PRESENT ILLNESS:  Stacy Hernandez is a 68 y.o. female with a history of adenomatous colon polyps and sessile serrated colon polyps.  Presents today for surveillance colonoscopy.  No complaints  REVIEW OF SYSTEMS:  All non-GI ROS negative except for  Past Medical History:  Diagnosis Date   AICD (automatic cardioverter/defibrillator) present 04/15/2015   Medtronic (serial  Number [id number]) biventricular ICD   Back pain    Recent positive ANA but told she has OA   CAD (coronary artery disease)    a.  per cath in June 2013 with nonobstructive CAD;  b.  Lexiscan  Myoview (12/15):  No ischemia.  Anteroseptal and apical fixed defect - 2/2 IVCD of LBBB type vs scar, EF 25%; Intermediate Risk   Cardiomyopathy (HCC)    a.  EF 45-50% by echo 04/23/2012;  b.  Echo (12/15):  EF 10-15%, Gr 2 DD, ventricular septum dyssynergy, mod MR, mod LAE.   CHF (congestive heart failure) (HCC)    COPD (chronic obstructive pulmonary disease) (HCC)    Hypercalcemia    Due to HCTZ, improved with d/c of calcium  supplements   Hypertension    Hypertriglyceridemia    Nephrolithiasis    OA (osteoarthritis)    Osteoporosis    Sleep apnea    Thyroid  disease    Post-ablation hypothyroidism   Tobacco abuse     Past Surgical History:  Procedure Laterality Date   BIOPSY  08/08/2018   Procedure: BIOPSY;  Surgeon: Rollin Dover, MD;  Location: [facility];  Service: Endoscopy;;   BLADDER SURGERY     bladder & bowel tac with mesh   CARPAL TUNNEL RELEASE     CESAREAN SECTION     COLONOSCOPY  2015   Norleen Kiang - normal   EP IMPLANTABLE DEVICE N/A 04/15/2015   Procedure: BiV ICD Insertion CRT-D;  Surgeon: Danelle LELON Birmingham, MD;  Medtronic (serial  Number [id number]) biventricular ICD   ESOPHAGOGASTRODUODENOSCOPY (EGD) WITH PROPOFOL  N/A 08/08/2018   Procedure: ESOPHAGOGASTRODUODENOSCOPY (EGD) WITH PROPOFOL ;  Surgeon: Rollin Dover, MD;  Location: [facility];  Service: Endoscopy;  Laterality: N/A;    ICD GENERATOR CHANGEOUT N/A 12/03/2023   Procedure: ICD GENERATOR CHANGEOUT;  Surgeon: Birmingham Danelle LELON, MD;  Location: [facility] INVASIVE CV LAB;  Service: Cardiovascular;  Laterality: N/A;   LEFT AND RIGHT HEART CATHETERIZATION WITH CORONARY ANGIOGRAM N/A 11/12/2014   Procedure: LEFT AND RIGHT HEART CATHETERIZATION WITH CORONARY ANGIOGRAM;  Surgeon: Ezra GORMAN Shuck, MD;  Location: [facility] CATH LAB;  Service: Cardiovascular;  Laterality: N/A;   LEFT HEART CATHETERIZATION WITH CORONARY ANGIOGRAM N/A 04/22/2012   Procedure: LEFT HEART CATHETERIZATION WITH CORONARY ANGIOGRAM;  Surgeon: Debby JONETTA Como, MD;  Location: [facility] CATH LAB;  Service: Cardiovascular;  Laterality: N/A;   PARTIAL HYSTERECTOMY     Due to cervical dysplasia   SAVORY DILATION N/A 08/08/2018   Procedure: SAVORY DILATION;  Surgeon: Rollin Dover, MD;  Location: [facility];  Service: Endoscopy;  Laterality: N/A;   TONSILLECTOMY     UPPER GASTROINTESTINAL ENDOSCOPY      Social History Julicia Krieger  reports that she quit smoking about 9 years ago. Her smoking use included cigarettes. She started smoking about 44 years ago. She has a 70 pack-year smoking history. She has never used smokeless tobacco. She reports that she does not drink alcohol and does not use drugs.  family history includes COPD in her mother; Cirrhosis in her sister; Colon cancer (age of onset: 83) in her maternal uncle; Colon polyps in her  mother; Coronary artery disease in her father; Diabetes in her sister; Heart attack in her father; Hypertension in her father.  Allergies  Allergen Reactions   Prednisone  Nausea Only       PHYSICAL EXAMINATION: Vital signs: BP (!) 124/46   Pulse (!) 58   Temp (!) 96.8 F (36 C) (Temporal)   Ht 5' 2 (1.575 m)   Wt 170 lb (77.1 kg)   SpO2 99%   BMI 31.09 kg/m  General: Well-developed, well-nourished, no acute distress HEENT: Sclerae are anicteric, conjunctiva pink. Oral mucosa intact Lungs: Clear Heart:  Regular Abdomen: soft, nontender, nondistended, no obvious ascites, no peritoneal signs, normal bowel sounds. No organomegaly. Extremities: No edema Psychiatric: alert and oriented x3. Cooperative     ASSESSMENT:  Personal history of sessile serrated and adenomatous colon polyps   PLAN:  Surveillance colonoscopy

## 2024-06-05 NOTE — Progress Notes (Signed)
 Pt's states no medical or surgical changes since previsit or office visit.

## 2024-06-08 ENCOUNTER — Telehealth: Payer: Self-pay | Admitting: *Deleted

## 2024-06-08 NOTE — Telephone Encounter (Signed)
  Follow up Call-     06/05/2024   10:23 AM  Call back number  Post procedure Call Back phone  # [phone]  Permission to leave phone message Yes     Patient questions:  Do you have a fever, pain , or abdominal swelling? No. Pain Score  0 *  Have you tolerated food without any problems? Yes.    Have you been able to return to your normal activities? Yes.    Do you have any questions about your discharge instructions: Diet   No. Medications  No. Follow up visit  No.  Do you have questions or concerns about your Care? No.  Actions: * If pain score is 4 or above: No action needed, pain <4.

## 2024-06-09 ENCOUNTER — Ambulatory Visit: Payer: Self-pay | Admitting: Internal Medicine

## 2024-06-09 LAB — SURGICAL PATHOLOGY

## 2024-06-17 ENCOUNTER — Ambulatory Visit (HOSPITAL_COMMUNITY)
Admission: RE | Admit: 2024-06-17 | Discharge: 2024-06-17 | Disposition: A | Discharge status: Home / Self Care | Source: Ambulatory Visit | Attending: Cardiology | Admitting: Cardiology

## 2024-06-17 DIAGNOSIS — I5022 Chronic systolic (congestive) heart failure: Secondary | ICD-10-CM | POA: Diagnosis not present

## 2024-06-17 LAB — BASIC METABOLIC PANEL WITH GFR
Anion gap: 8 (ref 5–15)
BUN: 21 mg/dL (ref 8–23)
CO2: 25 mmol/L (ref 22–32)
Calcium: 9.5 mg/dL (ref 8.9–10.3)
Chloride: 109 mmol/L (ref 98–111)
Creatinine, Ser: 1.09 mg/dL — ABNORMAL HIGH (ref 0.44–1.00)
GFR, Estimated: 56 mL/min — ABNORMAL LOW (ref >60)
Glucose, Bld: 97 mg/dL (ref 70–99)
Potassium: 4.4 mmol/L (ref 3.5–5.1)
Sodium: 142 mmol/L (ref 135–145)

## 2024-06-22 DIAGNOSIS — D3131 Benign neoplasm of right choroid: Secondary | ICD-10-CM | POA: Diagnosis not present

## 2024-07-09 DIAGNOSIS — I739 Peripheral vascular disease, unspecified: Secondary | ICD-10-CM | POA: Diagnosis not present

## 2024-07-09 DIAGNOSIS — M2141 Flat foot [pes planus] (acquired), right foot: Secondary | ICD-10-CM | POA: Diagnosis not present

## 2024-07-09 DIAGNOSIS — M7741 Metatarsalgia, right foot: Secondary | ICD-10-CM | POA: Diagnosis not present

## 2024-07-09 DIAGNOSIS — G629 Polyneuropathy, unspecified: Secondary | ICD-10-CM | POA: Diagnosis not present

## 2024-07-09 DIAGNOSIS — M7742 Metatarsalgia, left foot: Secondary | ICD-10-CM | POA: Diagnosis not present

## 2024-07-09 DIAGNOSIS — M792 Neuralgia and neuritis, unspecified: Secondary | ICD-10-CM | POA: Diagnosis not present

## 2024-07-09 DIAGNOSIS — M2142 Flat foot [pes planus] (acquired), left foot: Secondary | ICD-10-CM | POA: Diagnosis not present

## 2024-07-17 ENCOUNTER — Other Ambulatory Visit (HOSPITAL_COMMUNITY): Payer: Self-pay | Admitting: Cardiology

## 2024-08-11 NOTE — Progress Notes (Signed)
 Remote ICD Transmission

## 2024-08-13 ENCOUNTER — Other Ambulatory Visit: Payer: Self-pay | Admitting: Internal Medicine

## 2024-08-13 DIAGNOSIS — Z1211 Encounter for screening for malignant neoplasm of colon: Secondary | ICD-10-CM

## 2024-08-18 ENCOUNTER — Other Ambulatory Visit (HOSPITAL_COMMUNITY): Payer: Self-pay

## 2024-08-18 DIAGNOSIS — Z0000 Encounter for general adult medical examination without abnormal findings: Secondary | ICD-10-CM | POA: Diagnosis not present

## 2024-08-18 DIAGNOSIS — I428 Other cardiomyopathies: Secondary | ICD-10-CM | POA: Diagnosis not present

## 2024-08-18 DIAGNOSIS — E7800 Pure hypercholesterolemia, unspecified: Secondary | ICD-10-CM | POA: Diagnosis not present

## 2024-08-18 DIAGNOSIS — E890 Postprocedural hypothyroidism: Secondary | ICD-10-CM | POA: Diagnosis not present

## 2024-08-18 DIAGNOSIS — M810 Age-related osteoporosis without current pathological fracture: Secondary | ICD-10-CM | POA: Diagnosis not present

## 2024-08-20 ENCOUNTER — Ambulatory Visit: Payer: HMO

## 2024-08-20 DIAGNOSIS — I5022 Chronic systolic (congestive) heart failure: Secondary | ICD-10-CM | POA: Diagnosis not present

## 2024-08-21 LAB — CUP PACEART REMOTE DEVICE CHECK
Battery Remaining Longevity: 112 mo
Battery Voltage: 3.01 V
Brady Statistic RV Percent Paced: 75.56 %
Date Time Interrogation Session: 20251016051129
HighPow Impedance: 75 Ohm
Implantable Lead Connection Status: 753985
Implantable Lead Connection Status: 753985
Implantable Lead Connection Status: 753985
Implantable Lead Implant Date: 20160610
Implantable Lead Implant Date: 20160610
Implantable Lead Implant Date: 20160610
Implantable Lead Location: 753858
Implantable Lead Location: 753859
Implantable Lead Location: 753860
Implantable Lead Model: 4396
Implantable Lead Model: 5076
Implantable Pulse Generator Implant Date: 20250128
Lead Channel Impedance Value: 361 Ohm
Lead Channel Impedance Value: 437 Ohm
Lead Channel Impedance Value: 513 Ohm
Lead Channel Impedance Value: 570 Ohm
Lead Channel Impedance Value: 608 Ohm
Lead Channel Impedance Value: 760 Ohm
Lead Channel Pacing Threshold Amplitude: 0.5 V
Lead Channel Pacing Threshold Amplitude: 1.25 V
Lead Channel Pacing Threshold Pulse Width: 0.4 ms
Lead Channel Pacing Threshold Pulse Width: 0.4 ms
Lead Channel Sensing Intrinsic Amplitude: 0.8 mV
Lead Channel Sensing Intrinsic Amplitude: 14 mV
Lead Channel Setting Pacing Amplitude: 2 V
Lead Channel Setting Pacing Amplitude: 2.25 V
Lead Channel Setting Pacing Amplitude: 3.5 V
Lead Channel Setting Pacing Pulse Width: 0.4 ms
Lead Channel Setting Pacing Pulse Width: 0.4 ms
Lead Channel Setting Sensing Sensitivity: 0.3 mV
Zone Setting Status: 755011
Zone Setting Status: 755011

## 2024-08-23 ENCOUNTER — Ambulatory Visit: Payer: Self-pay | Admitting: Internal Medicine

## 2024-08-25 ENCOUNTER — Other Ambulatory Visit (HOSPITAL_COMMUNITY): Payer: Self-pay | Admitting: Cardiology

## 2024-08-26 NOTE — Progress Notes (Signed)
 Remote ICD Transmission

## 2024-08-27 ENCOUNTER — Other Ambulatory Visit (HOSPITAL_COMMUNITY): Payer: Self-pay | Admitting: Cardiology

## 2024-09-08 ENCOUNTER — Ambulatory Visit (HOSPITAL_COMMUNITY): Payer: Self-pay | Admitting: Cardiology

## 2024-09-08 ENCOUNTER — Encounter (HOSPITAL_COMMUNITY): Payer: Self-pay | Admitting: Cardiology

## 2024-09-08 ENCOUNTER — Ambulatory Visit (HOSPITAL_COMMUNITY)
Admission: RE | Admit: 2024-09-08 | Discharge: 2024-09-08 | Disposition: A | Discharge status: Home / Self Care | Source: Ambulatory Visit | Attending: Cardiology | Admitting: Cardiology

## 2024-09-08 VITALS — BP 128/80 | HR 62 | Wt 176.0 lb

## 2024-09-08 DIAGNOSIS — E669 Obesity, unspecified: Secondary | ICD-10-CM | POA: Diagnosis not present

## 2024-09-08 DIAGNOSIS — E8770 Fluid overload, unspecified: Secondary | ICD-10-CM | POA: Diagnosis not present

## 2024-09-08 DIAGNOSIS — I447 Left bundle-branch block, unspecified: Secondary | ICD-10-CM | POA: Insufficient documentation

## 2024-09-08 DIAGNOSIS — I5022 Chronic systolic (congestive) heart failure: Secondary | ICD-10-CM | POA: Diagnosis not present

## 2024-09-08 DIAGNOSIS — Z87891 Personal history of nicotine dependence: Secondary | ICD-10-CM | POA: Diagnosis not present

## 2024-09-08 DIAGNOSIS — J449 Chronic obstructive pulmonary disease, unspecified: Secondary | ICD-10-CM | POA: Diagnosis not present

## 2024-09-08 DIAGNOSIS — Z95810 Presence of automatic (implantable) cardiac defibrillator: Secondary | ICD-10-CM | POA: Insufficient documentation

## 2024-09-08 DIAGNOSIS — I428 Other cardiomyopathies: Secondary | ICD-10-CM | POA: Diagnosis not present

## 2024-09-08 DIAGNOSIS — E785 Hyperlipidemia, unspecified: Secondary | ICD-10-CM | POA: Diagnosis not present

## 2024-09-08 DIAGNOSIS — G4733 Obstructive sleep apnea (adult) (pediatric): Secondary | ICD-10-CM | POA: Insufficient documentation

## 2024-09-08 DIAGNOSIS — I340 Nonrheumatic mitral (valve) insufficiency: Secondary | ICD-10-CM | POA: Insufficient documentation

## 2024-09-08 DIAGNOSIS — Z79899 Other long term (current) drug therapy: Secondary | ICD-10-CM | POA: Insufficient documentation

## 2024-09-08 DIAGNOSIS — I110 Hypertensive heart disease with heart failure: Secondary | ICD-10-CM | POA: Diagnosis not present

## 2024-09-08 DIAGNOSIS — I2510 Atherosclerotic heart disease of native coronary artery without angina pectoris: Secondary | ICD-10-CM | POA: Insufficient documentation

## 2024-09-08 DIAGNOSIS — Z7984 Long term (current) use of oral hypoglycemic drugs: Secondary | ICD-10-CM | POA: Diagnosis not present

## 2024-09-08 LAB — BASIC METABOLIC PANEL WITH GFR
Anion gap: 11 (ref 5–15)
BUN: 12 mg/dL (ref 8–23)
CO2: 29 mmol/L (ref 22–32)
Calcium: 10.5 mg/dL — ABNORMAL HIGH (ref 8.9–10.3)
Chloride: 103 mmol/L (ref 98–111)
Creatinine, Ser: 0.88 mg/dL (ref 0.44–1.00)
GFR, Estimated: >60 mL/min (ref >60)
Glucose, Bld: 97 mg/dL (ref 70–99)
Potassium: 4.1 mmol/L (ref 3.5–5.1)
Sodium: 143 mmol/L (ref 135–145)

## 2024-09-08 LAB — BRAIN NATRIURETIC PEPTIDE: B Natriuretic Peptide: 248.6 pg/mL — ABNORMAL HIGH (ref 0.0–100.0)

## 2024-09-08 MED ORDER — CARVEDILOL 12.5 MG PO TABS: 18.7500 mg | ORAL_TABLET | Freq: Two times a day (BID) | ORAL | 3 refills | Status: AC

## 2024-09-08 MED ORDER — FUROSEMIDE 40 MG PO TABS
20.0000 mg | ORAL_TABLET | ORAL | 3 refills | Status: AC
Start: 2024-09-08 — End: 2024-12-07

## 2024-09-08 NOTE — Patient Instructions (Signed)
 CHANGE Lasix  to every other day.  CHANGE Carvedilol  to 18.75 mg ( 1 1/2 Tab) Twice daily  Labs done today, your results will be available in MyChart, we will contact you for abnormal readings.  REPEAT blood work in 10 days.  Your physician has requested that you have an echocardiogram. Echocardiography is a painless test that uses sound waves to create images of your heart. It provides your doctor with information about the size and shape of your heart and how well your heart's chambers and valves are working. This procedure takes approximately one hour. There are no restrictions for this procedure. Please do NOT wear cologne, perfume, aftershave, or lotions (deodorant is allowed). Please arrive 15 minutes prior to your appointment time.  Please note: We ask at that you not bring children with you during ultrasound (echo/ vascular) testing. Due to room size and safety concerns, children are not allowed in the ultrasound rooms during exams. Our front office staff cannot provide observation of children in our lobby area while testing is being conducted. An adult accompanying a patient to their appointment will only be allowed in the ultrasound room at the discretion of the ultrasound technician under special circumstances. We apologize for any inconvenience.  Your physician recommends that you schedule a follow-up appointment in: 4 months.  If you have any questions or concerns before your next appointment please send us  a message through [place] or call our office at [phone].    TO LEAVE A MESSAGE FOR THE NURSE SELECT OPTION 2, PLEASE LEAVE A MESSAGE INCLUDING: YOUR NAME DATE OF BIRTH CALL BACK NUMBER REASON FOR CALL**this is important as we prioritize the call backs  YOU WILL RECEIVE A CALL BACK THE SAME DAY AS LONG AS YOU CALL BEFORE 4:00 PM  At the [facility], you and your health needs are our priority. As part of our continuing mission to provide you with  exceptional heart care, we have created designated Provider Care Teams. These Care Teams include your primary Cardiologist (physician) and Advanced Practice Providers (APPs- Physician Assistants and Nurse Practitioners) who all work together to provide you with the care you need, when you need it.   You may see any of the following providers on your designated Care Team at your next follow up: Dr Toribio Fuel Dr Ezra Shuck Dr. Morene Brownie Greig Mosses, NP Caffie Shed, [place] [facility] [place], [place] Beckey Coe, NP Jordan Lee, NP Ellouise Class, NP Tinnie Redman, PharmD Jaun Bash, PharmD   Please be sure to bring in all your medications bottles to every appointment.    Thank you for choosing [facility]

## 2024-09-09 NOTE — Progress Notes (Signed)
 Date:  09/09/2024   ID:  Stacy Hernandez, DOB May 09, 1956, MRN [id number]   Provider location:  Advanced [facility] Failure Type of Visit: Established patient  PCP:  Clarice Nottingham, MD  Cardiologist: Dr. Rolan  Chief complaint: CHF   HPI: Stacy Hernandez is a 68 y.o. female who has a history of HTN, intermittent LBBB, and nonischemic cardiomyopathy.  She was admitted with chest pain in 6/13.  ECG showed intermittent LBBB.  Echo showed EF 45-50%.  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     Developed CP and dyspnea. She was set up for echo and Cardiolite  in 12/15.  Echo showed EF 10-15% (significant fall).  She had a Lexiscan  Cardiolite  with EF 25% and apical anteroseptal and apical fixed perfusion defect.  Therefore, she was sent for left and right heart catheterization in 1/16.  This showed mild elevation in LV and RV filling pressures and no significant CAD.  She was started on Lasix  20 mg daily.  Echo in 5/16 showed EF 20-25%, mildly dilated and mildly dysfunctional RV.  PFTs 4/16 showed severe obstruction and restriction.  She has seen Dr Darlean.  She is noted to have an elevated right hemidiaphragm.    In 6/16, she had Medtronic CRT-D system placed.  In 7/16, CPX showed low normal functional capacity with the main limitation being pulmonary.  Repeat echo in 11/16 showed improvement in EF to 40-45%.  Echo in 7/18 showed stable EF 40-45%.  Echo in 7/19 showed EF up to 50-55%.  Echo in 10/20 showed EF 50-55% with mildly decreased RV systolic function.  Echo in 6/22 showed EF 56%, normal RV.   Echo 11/24 EF 45-50%, mild LV dilation, mild MR, mild RV dysfunction, IVC normal.   S/p Gen change 4/25.   Today she returns for HF follow up. Weight up 7 lbs.  She takes Lasix  occasionally but is not really sure when she should use it.  Minimal exertional dyspnea.  Able to push mow her yard. No problems with ADLs.  No chest pain.  No orthopnea/PND.  No palpitations or  lightheadedness.   Medtronic device interrogation (personally reviewed): Decreased thoracic impedance (fluid index > threshold), no VT/AF, 98% BiV pacing  ECG (personally reviewed): NSR with BiV pacing   Labs (1/16): SPEP no M spike  Labs (10/24): K 3.9, creatinine 0.93, LDL 67, hgb 13.1 Labs (12/24): K 4.1, creatinine 0.79 Labs (10/25): K 4.3, creatinine 1.01, hgb 13.5, LDL 75   PMH: 1. HTN 2. Hyperlipidemia 3. COPD: Prior smoker.  PFTs (4/16) with FVC 37%, FEV1 36%, ratio 97%, TLC 67%, DLCO 32% => severe obstruction and restriction.  COPD + elevated right hemidiaphragm.  4. Intermittent LBBB/IVCD 5. Hypothyroidism 6. Cardiomyopathy: Nonischemic.  Echo (6/13) with EF 45-50%, paradoxical septal motion.  LHC (6/13) with 40% D1 stenosis, 30% PLV stenosis.  Echo (12/15) with EF 10-15%, moderately dilated LV, diffuse hypokinesis, moderate MR. Lexiscan  Cardiolite  (12/15) with EF 25%, diffuse hypokinesis, apical anteroseptal and apical fixed defect. LHC/RHC (1/16) with mean RA 8, PA 37/19, mean PCWP 19, CI 2.63, EF 30%, no significant CAD.  Cardiac MRI 12/20/2014: EF 24% Mod mitral regurg thought to be from dilated LV, normal RV size, no LGE.  Echo (5/16) with EF 20-25%, mildly dilated and mildly dysfunctional RV, mild MR. Medtronic CRT-D placed 6/16.  CPX (7/16) with peak VO2 16.3, VE/VCO2 slope 25, RER 1.1, low normal functional capacity limited primarily by lung disease.  - Echo (11/16) with EF  40-45%, mild MR.  - Echo (7/18): EF 40-45%, diffuse hypokinesis, grade II diastolic dysfunction.  - Echo (7/19): EF 50-55%, mild MR, PASP 37 mmHg.  - Echo (10/20): EF 50-55%, septal bounce, mildly decreased RV systolic function, PASP 26 mmHg.  - Echo (6/22): EF 56%, normal RV.  - Echo (11/24): EF 45-50%, mild LV dilation, mild MR, mild RV dysfunction, IVC normal.  7. 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  8. OSA: 04/18/2015 Sleep Study. She has not been able to tolerate  CPAP. 9. ABIs (12/18): Normal  Current Outpatient Medications  Medication Sig Dispense Refill   albuterol  (VENTOLIN  HFA) 108 (90 Base) MCG/ACT inhaler Inhale 2 puffs into the lungs every 4 (four) hours as needed.     alendronate (FOSAMAX) 70 MG tablet Take 70 mg by mouth every Sunday.     aspirin  81 MG tablet Take 81 mg by mouth at bedtime.      atorvastatin  (LIPITOR ) 10 MG tablet TAKE 1 TABLET BY MOUTH EVERYDAY AT BEDTIME 90 tablet 3   cholecalciferol  (VITAMIN D3) 25 MCG (1000 UNIT) tablet Take 1,000 Units by mouth daily.     ENTRESTO  97-103 MG TAKE 1 TABLET BY MOUTH TWICE A DAY 180 tablet 3   FARXIGA  10 MG TABS tablet TAKE 1 TABLET BY MOUTH DAILY BEFORE BREAKFAST. 90 tablet 3   gabapentin (NEURONTIN) 300 MG capsule Take 300 mg by mouth 2 (two) times daily.     Glucosamine-Chondroitin (OSTEO BI-FLEX REGULAR STRENGTH PO) Take 2 tablets by mouth daily.     levothyroxine  (SYNTHROID , LEVOTHROID) 100 MCG tablet Take 100 mcg by mouth daily before breakfast.     Multiple Vitamins-Minerals (MULTIVITAMIN WITH MINERALS) tablet Take 1 tablet by mouth daily.     niacin  500 MG tablet Take 1,500 mg by mouth at bedtime.     nitroGLYCERIN  (NITROSTAT ) 0.4 MG SL tablet Place 1 tablet (0.4 mg total) under the tongue every 5 (five) minutes as needed for chest pain. 25 tablet 3   spironolactone  (ALDACTONE ) 25 MG tablet TAKE 1 TABLET BY MOUTH EVERY DAY 90 tablet 3   tiZANidine  (ZANAFLEX ) 4 MG tablet Take 4 mg by mouth 2 (two) times daily as needed for muscle spasms.     traMADol (ULTRAM) 50 MG tablet Take 50 mg by mouth every evening.     carvedilol  (COREG ) 12.5 MG tablet Take 1.5 tablets (18.75 mg total) by mouth 2 (two) times daily. 180 tablet 3   furosemide  (LASIX ) 40 MG tablet Take 0.5 tablets (20 mg total) by mouth every other day. 30 tablet 3   No current facility-administered medications for this encounter.   Allergies:   Prednisone    Social History:  The patient  reports that she quit smoking about 9  years ago. Her smoking use included cigarettes. She started smoking about 44 years ago. She has a 70 pack-year smoking history. She has never used smokeless tobacco. She reports that she does not drink alcohol and does not use drugs.   Family History:  The patient's family history includes COPD in her mother; Cirrhosis in her sister; Colon cancer (age of onset: 58) in her maternal uncle; Colon polyps in her mother; Coronary artery disease in her father; Diabetes in her sister; Heart attack in her father; Hypertension in her father.   ROS:  Please see the history of present illness.   All other systems are personally reviewed and negative.   Wt Readings from Last 3 Encounters:  09/08/24 79.8 kg (176 lb)  06/05/24 77.1 kg (170 lb)  05/25/24 77.1 kg (170 lb)   BP 128/80   Pulse 62   Wt 79.8 kg (176 lb)   SpO2 94%   BMI 32.19 kg/m   Physical Exam:   General: NAD Neck: JVP 8 cm, no thyromegaly or thyroid  nodule.  Lungs: Clear to auscultation bilaterally with normal respiratory effort. CV: Nondisplaced PMI.  Heart regular S1/S2, no S3/S4, no murmur.  No peripheral edema.  No carotid bruit.  Normal pedal pulses.  Abdomen: Soft, nontender, no hepatosplenomegaly, no distention.  Skin: Intact without lesions or rashes.  Neurologic: Alert and oriented x 3.  Psych: Normal affect. Extremities: No clubbing or cyanosis.  HEENT: Normal.    ASSESSMENT AND PLAN: 1. Cardiomyopathy: Nonischemic cardiomyopathy. In the past, she had EF 45-50% with no significant CAD (2013).  LBBB-related nonischemic cardiomyopathy.  However, in the fall of 2015 she developed exertional chest pain and dyspnea.  Echo and Cardiolite  showed marked worsening of her LV systolic function (EF 10-15% by echo) and Cardiolite  suggested possible prior MI.  Left heart cath in 1/16 showed no significant CAD.  RHC showed mildly elevated right and left heart filling pressures and normal CI. SPEP negative and no infiltrative disease on  cardiac MRI. Repeat echo in 5/16 with EF 20-25%.  CPX showed limitations are related to lung disease as well as obesity, no HF limitation. Medtronic CRT-D placement.  Followup echo in 11/16 showed improvement in EF to 40-45%.  Echo in 10/20 showed EF 50-55%.  Echo in 6/22 with EF up to 56%. Echo 11/24 EF 45-50%, mild LV dilation, mild MR, mild RV dysfunction, IVC normal.  Weight is up, volume overloaded at least mildly by exam and Optivol.  NYHA class II.  - She will take Lasix  20 mg every other day.  BMET/BNP today, BMET in 10 days.  - Increase Coreg  to 18.75 mg bid.   - Continue Entresto  97/103 bid.   - Continue spironolactone  25 mg daily.   - Continue Farxiga  10 mg daily. - I will arrange for echo this year.  2. Elevated right hemidiaphragm: Chronic issue.  3. OSA:  Has not been able to tolerate CPAP.    4. COPD: Severe by PFTs. No longer smokes.  5. Hyperlipidemia: Continue atorvastatin .  PCP follows lipids  Follow up in 4 months with APP.   I spent 31 minutes reviewing records, interviewing/examining patient, and managing orders.   Signed, Ezra Shuck, MD  09/09/2024  [facility] [address] [facility] [place] [address] [phone] (office) [phone] (fax)

## 2024-09-15 DIAGNOSIS — E213 Hyperparathyroidism, unspecified: Secondary | ICD-10-CM | POA: Diagnosis not present

## 2024-09-15 DIAGNOSIS — E890 Postprocedural hypothyroidism: Secondary | ICD-10-CM | POA: Diagnosis not present

## 2024-09-16 ENCOUNTER — Encounter (HOSPITAL_COMMUNITY): Admitting: Cardiology

## 2024-09-17 ENCOUNTER — Other Ambulatory Visit (HOSPITAL_COMMUNITY): Payer: Self-pay

## 2024-09-18 ENCOUNTER — Other Ambulatory Visit (HOSPITAL_COMMUNITY)
Admission: RE | Admit: 2024-09-18 | Discharge: 2024-09-18 | Disposition: A | Discharge status: Home / Self Care | Source: Ambulatory Visit | Attending: Cardiology | Admitting: Cardiology

## 2024-09-18 DIAGNOSIS — I5022 Chronic systolic (congestive) heart failure: Secondary | ICD-10-CM | POA: Diagnosis present

## 2024-09-18 LAB — BASIC METABOLIC PANEL WITH GFR
Anion gap: 8 (ref 5–15)
BUN: 21 mg/dL (ref 8–23)
CO2: 27 mmol/L (ref 22–32)
Calcium: 9.3 mg/dL (ref 8.9–10.3)
Chloride: 106 mmol/L (ref 98–111)
Creatinine, Ser: 1 mg/dL (ref 0.44–1.00)
GFR, Estimated: >60 mL/min (ref >60)
Glucose, Bld: 93 mg/dL (ref 70–99)
Potassium: 4.2 mmol/L (ref 3.5–5.1)
Sodium: 141 mmol/L (ref 135–145)

## 2024-09-22 DIAGNOSIS — I10 Essential (primary) hypertension: Secondary | ICD-10-CM | POA: Diagnosis not present

## 2024-09-22 DIAGNOSIS — I428 Other cardiomyopathies: Secondary | ICD-10-CM | POA: Diagnosis not present

## 2024-09-22 DIAGNOSIS — E213 Hyperparathyroidism, unspecified: Secondary | ICD-10-CM | POA: Diagnosis not present

## 2024-09-22 DIAGNOSIS — M810 Age-related osteoporosis without current pathological fracture: Secondary | ICD-10-CM | POA: Diagnosis not present

## 2024-09-22 DIAGNOSIS — E559 Vitamin D deficiency, unspecified: Secondary | ICD-10-CM | POA: Diagnosis not present

## 2024-09-22 DIAGNOSIS — M859 Disorder of bone density and structure, unspecified: Secondary | ICD-10-CM | POA: Diagnosis not present

## 2024-09-22 DIAGNOSIS — E890 Postprocedural hypothyroidism: Secondary | ICD-10-CM | POA: Diagnosis not present

## 2024-09-22 DIAGNOSIS — E7800 Pure hypercholesterolemia, unspecified: Secondary | ICD-10-CM | POA: Diagnosis not present

## 2024-09-24 ENCOUNTER — Encounter: Payer: Self-pay | Admitting: Cardiology

## 2024-10-15 ENCOUNTER — Ambulatory Visit (HOSPITAL_COMMUNITY)
Admission: RE | Admit: 2024-10-15 | Discharge: 2024-10-15 | Disposition: A | Discharge status: Home / Self Care | Source: Ambulatory Visit | Attending: Cardiology | Admitting: Cardiology

## 2024-10-15 DIAGNOSIS — I5022 Chronic systolic (congestive) heart failure: Secondary | ICD-10-CM | POA: Diagnosis not present

## 2024-10-15 LAB — ECHOCARDIOGRAM COMPLETE
AR max vel: 1.48 cm2
AV Area VTI: 1.55 cm2
AV Area mean vel: 1.42 cm2
AV Mean grad: 7 mmHg
AV Peak grad: 13.7 mmHg
Ao pk vel: 1.85 m/s
Area-P 1/2: 4.8 cm2
Calc EF: 54 %
S' Lateral: 3.9 cm
Single Plane A2C EF: 54.3 %
Single Plane A4C EF: 54 %

## 2024-10-15 NOTE — Progress Notes (Signed)
*  PRELIMINARY RESULTS* Echocardiogram 2D Echocardiogram has been performed.  Stacy Hernandez 10/15/2024, 3:03 PM

## 2024-10-16 NOTE — Telephone Encounter (Addendum)
 Pt aware, agreeable, and verbalized understanding   ----- Message from Ezra Shuck, MD sent at 10/15/2024  5:23 PM EST ----- EF low normal at 50-55%, would not make any changes.

## 2024-10-27 ENCOUNTER — Other Ambulatory Visit: Payer: Self-pay | Admitting: Internal Medicine

## 2024-10-27 DIAGNOSIS — Z1231 Encounter for screening mammogram for malignant neoplasm of breast: Secondary | ICD-10-CM

## 2024-11-04 ENCOUNTER — Encounter: Payer: Self-pay | Admitting: Internal Medicine

## 2024-11-04 ENCOUNTER — Ambulatory Visit: Attending: Internal Medicine | Admitting: Internal Medicine

## 2024-11-04 VITALS — BP 110/62 | HR 68 | Ht 62.0 in | Wt 176.1 lb

## 2024-11-04 DIAGNOSIS — I5022 Chronic systolic (congestive) heart failure: Secondary | ICD-10-CM

## 2024-11-04 LAB — CUP PACEART INCLINIC DEVICE CHECK
Date Time Interrogation Session: 20251231203632
Implantable Lead Connection Status: 753985
Implantable Lead Connection Status: 753985
Implantable Lead Connection Status: 753985
Implantable Lead Implant Date: 20160610
Implantable Lead Implant Date: 20160610
Implantable Lead Implant Date: 20160610
Implantable Lead Location: 753858
Implantable Lead Location: 753859
Implantable Lead Location: 753860
Implantable Lead Model: 4396
Implantable Lead Model: 5076
Implantable Pulse Generator Implant Date: 20250128

## 2024-11-04 NOTE — Patient Instructions (Signed)

## 2024-11-04 NOTE — Progress Notes (Signed)
 "     HPI Stacy Hernandez returns today for followup. She is a pleasant 68 yo woman with chronic systolic heart failure and LBBB who underwent biv ICD insertion over 10 years ago. She had reached ERI and underwent gen change out a year ago. She has had normalization of her LV function. No ICD therapies. She denies syncope. Her weight has not changed. She has an elevated atrial threshold which we have tried to program around. Allergies[1]   Current Outpatient Medications  Medication Sig Dispense Refill   albuterol  (VENTOLIN  HFA) 108 (90 Base) MCG/ACT inhaler Inhale 2 puffs into the lungs every 4 (four) hours as needed.     alendronate (FOSAMAX) 70 MG tablet Take 70 mg by mouth every Sunday.     aspirin  81 MG tablet Take 81 mg by mouth at bedtime.      atorvastatin  (LIPITOR ) 10 MG tablet TAKE 1 TABLET BY MOUTH EVERYDAY AT BEDTIME 90 tablet 3   carvedilol  (COREG ) 12.5 MG tablet Take 1.5 tablets (18.75 mg total) by mouth 2 (two) times daily. 180 tablet 3   cholecalciferol  (VITAMIN D3) 25 MCG (1000 UNIT) tablet Take 1,000 Units by mouth daily.     ENTRESTO  97-103 MG TAKE 1 TABLET BY MOUTH TWICE A DAY 180 tablet 3   FARXIGA  10 MG TABS tablet TAKE 1 TABLET BY MOUTH DAILY BEFORE BREAKFAST. 90 tablet 3   furosemide  (LASIX ) 40 MG tablet Take 0.5 tablets (20 mg total) by mouth every other day. 30 tablet 3   gabapentin (NEURONTIN) 300 MG capsule Take 300 mg by mouth 2 (two) times daily.     Glucosamine-Chondroitin (OSTEO BI-FLEX REGULAR STRENGTH PO) Take 2 tablets by mouth daily.     levothyroxine  (SYNTHROID , LEVOTHROID) 100 MCG tablet Take 100 mcg by mouth daily before breakfast.     Multiple Vitamins-Minerals (MULTIVITAMIN WITH MINERALS) tablet Take 1 tablet by mouth daily.     niacin  500 MG tablet Take 1,500 mg by mouth at bedtime.     nitroGLYCERIN  (NITROSTAT ) 0.4 MG SL tablet Place 1 tablet (0.4 mg total) under the tongue every 5 (five) minutes as needed for chest pain. 25 tablet 3   spironolactone   (ALDACTONE ) 25 MG tablet TAKE 1 TABLET BY MOUTH EVERY DAY 90 tablet 3   tiZANidine  (ZANAFLEX ) 4 MG tablet Take 4 mg by mouth 2 (two) times daily as needed for muscle spasms.     traMADol (ULTRAM) 50 MG tablet Take 50 mg by mouth every evening.     No current facility-administered medications for this visit.     Past Medical History:  Diagnosis Date   AICD (automatic cardioverter/defibrillator) present 04/15/2015   Medtronic (serial  Number [id number]) biventricular ICD   Back pain    Recent positive ANA but told she has OA   CAD (coronary artery disease)    a.  per cath in June 2013 with nonobstructive CAD;  b.  Lexiscan  Myoview (12/15):  No ischemia.  Anteroseptal and apical fixed defect - 2/2 IVCD of LBBB type vs scar, EF 25%; Intermediate Risk   Cardiomyopathy (HCC)    a.  EF 45-50% by echo 04/23/2012;  b.  Echo (12/15):  EF 10-15%, Gr 2 DD, ventricular septum dyssynergy, mod MR, mod LAE.   CHF (congestive heart failure) (HCC)    COPD (chronic obstructive pulmonary disease) (HCC)    Hypercalcemia    Due to HCTZ, improved with d/c of calcium  supplements   Hypertension    Hypertriglyceridemia    Nephrolithiasis  OA (osteoarthritis)    Osteoporosis    Sleep apnea    Thyroid  disease    Post-ablation hypothyroidism   Tobacco abuse     ROS:   All systems reviewed and negative except as noted in the HPI.   Past Surgical History:  Procedure Laterality Date   BIOPSY  08/08/2018   Procedure: BIOPSY;  Surgeon: Rollin Dover, MD;  Location: [facility];  Service: Endoscopy;;   BLADDER SURGERY     bladder & bowel tac with mesh   CARPAL TUNNEL RELEASE     CESAREAN SECTION     COLONOSCOPY  2015   Norleen Kiang - normal   EP IMPLANTABLE DEVICE N/A 04/15/2015   Procedure: BiV ICD Insertion CRT-D;  Surgeon: Danelle LELON Birmingham, MD;  Medtronic (serial  Number [id number]) biventricular ICD   ESOPHAGOGASTRODUODENOSCOPY (EGD) WITH PROPOFOL  N/A 08/08/2018   Procedure:  ESOPHAGOGASTRODUODENOSCOPY (EGD) WITH PROPOFOL ;  Surgeon: Rollin Dover, MD;  Location: [facility];  Service: Endoscopy;  Laterality: N/A;   ICD GENERATOR CHANGEOUT N/A 12/03/2023   Procedure: ICD GENERATOR CHANGEOUT;  Surgeon: Birmingham Danelle LELON, MD;  Location: [facility] INVASIVE CV LAB;  Service: Cardiovascular;  Laterality: N/A;   LEFT AND RIGHT HEART CATHETERIZATION WITH CORONARY ANGIOGRAM N/A 11/12/2014   Procedure: LEFT AND RIGHT HEART CATHETERIZATION WITH CORONARY ANGIOGRAM;  Surgeon: Ezra GORMAN Shuck, MD;  Location: [facility] CATH LAB;  Service: Cardiovascular;  Laterality: N/A;   LEFT HEART CATHETERIZATION WITH CORONARY ANGIOGRAM N/A 04/22/2012   Procedure: LEFT HEART CATHETERIZATION WITH CORONARY ANGIOGRAM;  Surgeon: Debby JONETTA Como, MD;  Location: [facility] CATH LAB;  Service: Cardiovascular;  Laterality: N/A;   PARTIAL HYSTERECTOMY     Due to cervical dysplasia   SAVORY DILATION N/A 08/08/2018   Procedure: SAVORY DILATION;  Surgeon: Rollin Dover, MD;  Location: [facility];  Service: Endoscopy;  Laterality: N/A;   TONSILLECTOMY     UPPER GASTROINTESTINAL ENDOSCOPY       Family History  Problem Relation Age of Onset   COPD Mother        smoked   Colon polyps Mother    Coronary artery disease Father        s/p CABG in his 53s   Hypertension Father    Heart attack Father    Diabetes Sister    Cirrhosis Sister    Colon cancer Maternal Uncle 21   Stroke Neg Hx    BRCA 1/2 Neg Hx    Breast cancer Neg Hx    Rectal cancer Neg Hx    Stomach cancer Neg Hx    Esophageal cancer Neg Hx      Social History   Socioeconomic History   Marital status: Married    Spouse name: Not on file   Number of children: Not on file   Years of education: Not on file   Highest education level: Not on file  Occupational History   Not on file  Tobacco Use   Smoking status: Former    Current packs/day: 0.00    Average packs/day: 2.0 packs/day for 35.0 years (70.0 ttl pk-yrs)    Types: Cigarettes    Start  date: 11/06/1979    Quit date: 11/05/2014    Years since quitting: 10.0   Smokeless tobacco: Never  Vaping Use   Vaping status: Never Used  Substance and Sexual Activity   Alcohol use: No    Alcohol/week: 0.0 standard drinks of alcohol   Drug use: Never   Sexual activity: Not Currently  Other Topics Concern  Not on file  Social History Narrative   Not on file   Social Drivers of Health   Tobacco Use: Medium Risk (11/04/2024)   Patient History    Smoking Tobacco Use: Former    Smokeless Tobacco Use: Never    Passive Exposure: Not on Actuary Strain: Not on file  Food Insecurity: Not on file  Transportation Needs: Not on file  Physical Activity: Not on file  Stress: Not on file  Social Connections: Unknown (03/20/2022)   [facility]   Social Network    Social Network: Not on file  Intimate Partner Violence: Unknown (02/09/2022)   Received from [facility]   HITS    Physically Hurt: Not on file    Insult or Talk Down To: Not on file    Threaten Physical Harm: Not on file    Scream or Curse: Not on file  Depression (PHQ2-9): Not on file  Alcohol Screen: Not on file  Housing: Not on file  Utilities: Not on file  Health Literacy: Not on file     BP 110/62 (BP Location: Right Arm, Patient Position: Sitting)   Pulse 68   Ht 5' 2 (1.575 m)   Wt 176 lb 1.6 oz (79.9 kg)   SpO2 97%   BMI 32.21 kg/m   Physical Exam:  Well appearing NAD HEENT: Unremarkable Neck:  No JVD, no thyromegally Lymphatics:  No adenopathy Back:  No CVA tenderness Lungs:  Clear HEART:  Regular rate rhythm, no murmurs, no rubs, no clicks Abd:  soft, positive bowel sounds, no organomegally, no rebound, no guarding Ext:  2 plus pulses, no edema, no cyanosis, no clubbing Skin:  No rashes no nodules Neuro:  CN II through XII intact, motor grossly intact   DEVICE  Normal device function.  See PaceArt for details.   Assess/Plan:  Chronic systolic heart failure  - she has had normalization of her LV function and she will continue her GDMT. ICD - she is s/p ICD gen change out. No change.  Atrial threshold - her atrial threshold is chronically elevated. She paces less than 2%. We will reprogram the device. No plans for extraction at this time.  Obesity - she is encouraged to lose weight.    Danelle Kosisochukwu Goldberg,MD    [1]  Allergies Allergen Reactions   Prednisone  Nausea Only   "

## 2024-11-06 ENCOUNTER — Other Ambulatory Visit (HOSPITAL_COMMUNITY): Payer: Self-pay

## 2024-11-06 ENCOUNTER — Telehealth (HOSPITAL_COMMUNITY): Payer: Self-pay

## 2024-11-06 NOTE — Telephone Encounter (Signed)
 Advanced Heart Failure Patient Advocate Encounter  The patient was approved for a [employer] grant that will help cover the cost of Carvedilol , Entresto , Farxiga , Spironolactone .  Total amount awarded, $7,500.  Effective: 10/07/2024 - 10/06/2025.  BIN [id number] PCN [id number] Group [id number] ID [id number]  Patient informed via phone and [employer].  Rachel DEL, CPhT Rx Patient Advocate Phone: [phone]

## 2024-11-19 ENCOUNTER — Ambulatory Visit (INDEPENDENT_AMBULATORY_CARE_PROVIDER_SITE_OTHER): Payer: HMO

## 2024-11-19 DIAGNOSIS — I428 Other cardiomyopathies: Secondary | ICD-10-CM | POA: Diagnosis not present

## 2024-11-19 LAB — CUP PACEART REMOTE DEVICE CHECK
Battery Remaining Longevity: 109 mo
Battery Voltage: 3 V
Brady Statistic AP VP Percent: 1.82 %
Brady Statistic AP VS Percent: 0.07 %
Brady Statistic AS VP Percent: 96.31 %
Brady Statistic AS VS Percent: 1.8 %
Brady Statistic RA Percent Paced: 1.89 %
Brady Statistic RV Percent Paced: 85.92 %
Date Time Interrogation Session: 20260115004739
HighPow Impedance: 74 Ohm
Implantable Lead Connection Status: 753985
Implantable Lead Connection Status: 753985
Implantable Lead Connection Status: 753985
Implantable Lead Implant Date: 20160610
Implantable Lead Implant Date: 20160610
Implantable Lead Implant Date: 20160610
Implantable Lead Location: 753858
Implantable Lead Location: 753859
Implantable Lead Location: 753860
Implantable Lead Model: 4396
Implantable Lead Model: 5076
Implantable Pulse Generator Implant Date: 20250128
Lead Channel Impedance Value: 380 Ohm
Lead Channel Impedance Value: 437 Ohm
Lead Channel Impedance Value: 513 Ohm
Lead Channel Impedance Value: 570 Ohm
Lead Channel Impedance Value: 646 Ohm
Lead Channel Impedance Value: 741 Ohm
Lead Channel Pacing Threshold Amplitude: 0.5 V
Lead Channel Pacing Threshold Amplitude: 1.125 V
Lead Channel Pacing Threshold Pulse Width: 0.4 ms
Lead Channel Pacing Threshold Pulse Width: 0.4 ms
Lead Channel Sensing Intrinsic Amplitude: 0.9 mV
Lead Channel Sensing Intrinsic Amplitude: 14.5 mV
Lead Channel Setting Pacing Amplitude: 2 V
Lead Channel Setting Pacing Amplitude: 2.25 V
Lead Channel Setting Pacing Amplitude: 5 V
Lead Channel Setting Pacing Pulse Width: 0.4 ms
Lead Channel Setting Pacing Pulse Width: 0.4 ms
Lead Channel Setting Sensing Sensitivity: 0.3 mV
Zone Setting Status: 755011
Zone Setting Status: 755011

## 2024-11-20 ENCOUNTER — Ambulatory Visit
Admission: EM | Admit: 2024-11-20 | Discharge: 2024-11-20 | Disposition: A | Discharge status: Home / Self Care | Attending: Nurse Practitioner | Admitting: Nurse Practitioner

## 2024-11-20 ENCOUNTER — Encounter: Payer: Self-pay | Admitting: Emergency Medicine

## 2024-11-20 ENCOUNTER — Other Ambulatory Visit: Payer: Self-pay

## 2024-11-20 DIAGNOSIS — M545 Low back pain, unspecified: Secondary | ICD-10-CM | POA: Diagnosis not present

## 2024-11-20 DIAGNOSIS — Z8739 Personal history of other diseases of the musculoskeletal system and connective tissue: Secondary | ICD-10-CM | POA: Diagnosis not present

## 2024-11-20 MED ORDER — DEXAMETHASONE SOD PHOSPHATE PF 10 MG/ML IJ SOLN
10.0000 mg | Freq: Once | INTRAMUSCULAR | Status: AC
  Administered 2024-11-20: 10 mg via INTRAMUSCULAR

## 2024-11-20 MED ORDER — LIDOCAINE 5 % EX PTCH: 1.0000 | MEDICATED_PATCH | CUTANEOUS | 0 refills | Status: AC

## 2024-11-20 NOTE — ED Provider Notes (Signed)
 " [facility]    CSN: [id number] Arrival date & time: 11/20/24  1334      History   Chief Complaint Chief Complaint  Patient presents with   Back Pain    HPI Stacy Hernandez is a 69 y.o. female.   The history is provided by the patient.   Patient presents for a 2-week history of right sided low back pain.  Patient stated symptoms started after she was out working in the yard pulling weeds.  She states approximately 3 days later, she developed pain in her right lower back.  She states that the pain started in an area where she normally has pain, states that it has since moved lower.  She denies injury, trauma, lower extremity weakness, numbness, tingling, the inability to ambulate, urinary frequency, urgency, flank pain, loss of bowel or bladder function, or saddle anesthesia.  Patient reports that she does have underlying history of osteoarthritis, states she was wearing a back brace when she was performing the yard work.  States that she has taken tizanidine , tramadol, and used Biofreeze with minimal relief of her symptoms.  She rates pain 10/10 at present.  Per review of her chart, she does have a history of back pain.  Past Medical History:  Diagnosis Date   AICD (automatic cardioverter/defibrillator) present 04/15/2015   Medtronic (serial  Number [id number]) biventricular ICD   Back pain    Recent positive ANA but told she has OA   CAD (coronary artery disease)    a.  per cath in June 2013 with nonobstructive CAD;  b.  Lexiscan  Myoview (12/15):  No ischemia.  Anteroseptal and apical fixed defect - 2/2 IVCD of LBBB type vs scar, EF 25%; Intermediate Risk   Cardiomyopathy (HCC)    a.  EF 45-50% by echo 04/23/2012;  b.  Echo (12/15):  EF 10-15%, Gr 2 DD, ventricular septum dyssynergy, mod MR, mod LAE.   CHF (congestive heart failure) (HCC)    COPD (chronic obstructive pulmonary disease) (HCC)    Hypercalcemia    Due to HCTZ, improved with d/c of calcium  supplements    Hypertension    Hypertriglyceridemia    Nephrolithiasis    OA (osteoarthritis)    Osteoporosis    Sleep apnea    Thyroid  disease    Post-ablation hypothyroidism   Tobacco abuse     Patient Active Problem List   Diagnosis Date Noted   Biventricular ICD (implantable cardioverter-defibrillator) in place 05/21/2019   Obesity 06/07/2015   S/P ICD (internal cardiac defibrillator) procedure - Medtronic (serial  Number [id number]) biventricular ICD 04/15/2015   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   Dyspnea 03/11/2015   OSA (obstructive sleep apnea) 02/10/2015   Acute sinusitis 12/16/2014   Chronic systolic CHF (congestive heart failure) (HCC) 12/16/2014   Angina decubitus 11/04/2014   Cardiomyopathy (HCC) 10/12/2012   CAD (coronary artery disease) 05/14/2012   HTN (hypertension) 05/14/2012   HLD (hyperlipidemia) 05/14/2012   LV dysfunction 05/14/2012    Past Surgical History:  Procedure Laterality Date   BIOPSY  08/08/2018   Procedure: BIOPSY;  Surgeon: Rollin Dover, MD;  Location: [facility];  Service: Endoscopy;;   BLADDER SURGERY     bladder & bowel tac with mesh   CARPAL TUNNEL RELEASE     CESAREAN SECTION     COLONOSCOPY  2015   Norleen Kiang - normal   EP IMPLANTABLE DEVICE N/A 04/15/2015   Procedure: BiV ICD Insertion CRT-D;  Surgeon: Danelle LELON Birmingham, MD;  Medtronic (serial  Number [id number]) biventricular ICD   ESOPHAGOGASTRODUODENOSCOPY (EGD) WITH PROPOFOL  N/A 08/08/2018   Procedure: ESOPHAGOGASTRODUODENOSCOPY (EGD) WITH PROPOFOL ;  Surgeon: Rollin Dover, MD;  Location: [facility];  Service: Endoscopy;  Laterality: N/A;   ICD GENERATOR CHANGEOUT N/A 12/03/2023   Procedure: ICD GENERATOR CHANGEOUT;  Surgeon: Waddell Danelle ORN, MD;  Location: [facility] INVASIVE CV LAB;  Service: Cardiovascular;  Laterality: N/A;   LEFT AND RIGHT HEART CATHETERIZATION WITH CORONARY ANGIOGRAM N/A 11/12/2014   Procedure: LEFT AND RIGHT HEART CATHETERIZATION WITH CORONARY ANGIOGRAM;  Surgeon:  Ezra GORMAN Shuck, MD;  Location: [facility] CATH LAB;  Service: Cardiovascular;  Laterality: N/A;   LEFT HEART CATHETERIZATION WITH CORONARY ANGIOGRAM N/A 04/22/2012   Procedure: LEFT HEART CATHETERIZATION WITH CORONARY ANGIOGRAM;  Surgeon: Debby JONETTA Como, MD;  Location: [facility] CATH LAB;  Service: Cardiovascular;  Laterality: N/A;   PARTIAL HYSTERECTOMY     Due to cervical dysplasia   SAVORY DILATION N/A 08/08/2018   Procedure: SAVORY DILATION;  Surgeon: Rollin Dover, MD;  Location: [facility];  Service: Endoscopy;  Laterality: N/A;   TONSILLECTOMY     UPPER GASTROINTESTINAL ENDOSCOPY      OB History   No obstetric history on file.      Home Medications    Prior to Admission medications  Medication Sig Start Date End Date Taking? Authorizing Provider  albuterol  (VENTOLIN  HFA) 108 (90 Base) MCG/ACT inhaler Inhale 2 puffs into the lungs every 4 (four) hours as needed. 08/04/21   [provider]  alendronate (FOSAMAX) 70 MG tablet Take 70 mg by mouth every Sunday.    [provider]  aspirin  81 MG tablet Take 81 mg by mouth at bedtime.     [provider]  atorvastatin  (LIPITOR ) 10 MG tablet TAKE 1 TABLET BY MOUTH EVERYDAY AT BEDTIME 03/11/24   McLean, Dalton S, MD  carvedilol  (COREG ) 12.5 MG tablet Take 1.5 tablets (18.75 mg total) by mouth 2 (two) times daily. 09/08/24   Shuck Ezra GORMAN, MD  cholecalciferol  (VITAMIN D3) 25 MCG (1000 UNIT) tablet Take 1,000 Units by mouth daily.    [provider]  ENTRESTO  97-103 MG TAKE 1 TABLET BY MOUTH TWICE A DAY 07/17/24   Shuck Ezra GORMAN, MD  FARXIGA  10 MG TABS tablet TAKE 1 TABLET BY MOUTH DAILY BEFORE BREAKFAST. 08/25/24   Shuck Ezra GORMAN, MD  furosemide  (LASIX ) 40 MG tablet Take 0.5 tablets (20 mg total) by mouth every other day. 09/08/24 12/07/24  Shuck Ezra GORMAN, MD  gabapentin (NEURONTIN) 300 MG capsule Take 300 mg by mouth 2 (two) times daily.    [provider]  Glucosamine-Chondroitin (OSTEO BI-FLEX  REGULAR STRENGTH PO) Take 2 tablets by mouth daily.    [provider]  levothyroxine  (SYNTHROID , LEVOTHROID) 100 MCG tablet Take 100 mcg by mouth daily before breakfast.    [provider]  Multiple Vitamins-Minerals (MULTIVITAMIN WITH MINERALS) tablet Take 1 tablet by mouth daily.    [provider]  niacin  500 MG tablet Take 1,500 mg by mouth at bedtime.    [provider]  nitroGLYCERIN  (NITROSTAT ) 0.4 MG SL tablet Place 1 tablet (0.4 mg total) under the tongue every 5 (five) minutes as needed for chest pain. 09/18/23   Shuck Ezra GORMAN, MD  spironolactone  (ALDACTONE ) 25 MG tablet TAKE 1 TABLET BY MOUTH EVERY DAY 12/05/23   McLean, Dalton S, MD  tiZANidine  (ZANAFLEX ) 4 MG tablet Take 4 mg by mouth 2 (two) times daily as needed for muscle spasms.  [provider]  traMADol (ULTRAM) 50 MG tablet Take 50 mg by mouth every evening.    [provider]    Family History Family History  Problem Relation Age of Onset   COPD Mother        smoked   Colon polyps Mother    Coronary artery disease Father        s/p CABG in his 18s   Hypertension Father    Heart attack Father    Diabetes Sister    Cirrhosis Sister    Colon cancer Maternal Uncle 57   Stroke Neg Hx    BRCA 1/2 Neg Hx    Breast cancer Neg Hx    Rectal cancer Neg Hx    Stomach cancer Neg Hx    Esophageal cancer Neg Hx     Social History Social History[1]   Allergies   Prednisone    Review of Systems Review of Systems Per HPI  Physical Exam Triage Vital Signs ED Triage Vitals  Encounter Vitals Group     BP 11/20/24 1401 (!) 145/84     Girls Systolic BP Percentile --      Girls Diastolic BP Percentile --      Boys Systolic BP Percentile --      Boys Diastolic BP Percentile --      Pulse Rate 11/20/24 1401 60     Resp 11/20/24 1401 20     Temp 11/20/24 1401 (!) 97.4 F (36.3 C)     Temp Source 11/20/24 1401 Oral     SpO2 11/20/24 1401 97 %     Weight --       Height --      Head Circumference --      Peak Flow --      Pain Score 11/20/24 1400 10     Pain Loc --      Pain Education --      Exclude from Growth Chart --    No data found.  Updated Vital Signs BP (!) 145/84 (BP Location: Right Arm)   Pulse 60   Temp (!) 97.4 F (36.3 C) (Oral)   Resp 20   SpO2 97%   Visual Acuity Right Eye Distance:   Left Eye Distance:   Bilateral Distance:    Right Eye Near:   Left Eye Near:    Bilateral Near:     Physical Exam Vitals and nursing note reviewed.  Constitutional:      General: She is not in acute distress.    Appearance: Normal appearance.  HENT:     Head: Normocephalic.  Eyes:     Extraocular Movements: Extraocular movements intact.     Conjunctiva/sclera: Conjunctivae normal.     Pupils: Pupils are equal, round, and reactive to light.  Cardiovascular:     Rate and Rhythm: Normal rate and regular rhythm.     Pulses: Normal pulses.     Heart sounds: Normal heart sounds.  Pulmonary:     Effort: Pulmonary effort is normal.     Breath sounds: Normal breath sounds.  Musculoskeletal:     Cervical back: Normal range of motion.     Lumbar back: Spasms and tenderness present. No swelling or deformity. Decreased range of motion.       Back:  Neurological:     General: No focal deficit present.     Mental Status: She is alert and oriented to person, place, and time.  Psychiatric:  Mood and Affect: Mood normal.        Behavior: Behavior normal.      UC Treatments / Results  Labs (all labs ordered are listed, but only abnormal results are displayed) Labs Reviewed - No data to display  EKG   Radiology   Procedures Procedures (including critical care time)  Medications Ordered in UC Medications - No data to display  Initial Impression / Assessment and Plan / UC Course  I have reviewed the triage vital signs and the nursing notes.  Pertinent labs & imaging results that were available during my care of  the patient were reviewed by me and considered in my medical decision making (see chart for details).  Will defer imaging at this time as patient does not have any known injury or trauma.  Per review of her chart, she does have a history of back pain.  States she is currently taking tramadol and tizanidine  for her ongoing back pain prescribed by her PCP.  Today, she states that the pain has moved lower into her right lower back.  She does have tenderness noted to the right lower back.  There is no obvious bruising, swelling, or deformity present.  No red flag symptoms noted on her exam today.  Decadron  10 mg IM administered.  Will treat with lidocaine  patches for patient to apply topically.  Patient advised to continue tizanidine  and tramadol as needed.  Supportive care recommendations were provided and discussed with the patient to include over-the-counter Tylenol , the use of ice or heat, stretching exercises, and attempting to stay active.  Patient was given strict ER follow-up precautions, also recommend follow-up with her PCP for further evaluation.  Patient was in agreement with this plan of care and verbalizes understanding.  All questions were answered.  Patient stable for discharge.   Final Clinical Impressions(s) / UC Diagnoses   Final diagnoses:  None   Discharge Instructions   None    ED Prescriptions   None    PDMP not reviewed this encounter.     [1]  Social History Tobacco Use   Smoking status: Former    Current packs/day: 0.00    Average packs/day: 2.0 packs/day for 35.0 years (70.0 ttl pk-yrs)    Types: Cigarettes    Start date: 11/06/1979    Quit date: 11/05/2014    Years since quitting: 10.0   Smokeless tobacco: Never  Vaping Use   Vaping status: Never Used  Substance Use Topics   Alcohol use: No    Alcohol/week: 0.0 standard drinks of alcohol   Drug use: Never     Gilmer Etta PARAS, NP 11/20/24 1423  "

## 2024-11-20 NOTE — Discharge Instructions (Addendum)
 You are given an injection of Decadron  10 mg today. Use medication as prescribed. You may take over-the-counter Tylenol  as needed for pain or discomfort.  You may also continue your current pain management with tramadol and tizanidine  as prescribed. Recommend the use of ice or heat.  Apply ice for pain or swelling, heat for spasm or stiffness.  Apply for 20 minutes, remove for 1 hour, repeat as needed. Gentle stretching and range of motion exercises as tolerated while symptoms persist. Try to remain as active as possible while symptoms persist. Go to the emergency department if you experience more severe back pain, numbness or tingling in your legs or feet, become unable to ambulate, or loss or control of your bowel or bladder. Follow-up with your primary care physician for further evaluation as scheduled. Follow-up as needed.

## 2024-11-20 NOTE — ED Triage Notes (Signed)
 Pt reports lower back pain x2 weeks. Denies any known injury but reports started after working in the yard. Pt reports has tried otc medications, patches, gels with no change. Denies any gi/gu symptoms.

## 2024-11-22 ENCOUNTER — Ambulatory Visit: Payer: Self-pay | Admitting: Student in an Organized Health Care Education/Training Program

## 2024-11-22 ENCOUNTER — Other Ambulatory Visit (HOSPITAL_COMMUNITY): Payer: Self-pay | Admitting: Cardiology

## 2024-11-26 NOTE — Progress Notes (Signed)
 Remote ICD Transmission

## 2024-11-30 ENCOUNTER — Ambulatory Visit

## 2024-12-10 ENCOUNTER — Ambulatory Visit
Admission: RE | Admit: 2024-12-10 | Discharge: 2024-12-10 | Disposition: A | Discharge status: Home / Self Care | Source: Ambulatory Visit | Attending: Internal Medicine | Admitting: Internal Medicine

## 2024-12-10 DIAGNOSIS — Z1231 Encounter for screening mammogram for malignant neoplasm of breast: Secondary | ICD-10-CM

## 2025-01-18 ENCOUNTER — Ambulatory Visit (HOSPITAL_COMMUNITY)
